# Patient Record
Sex: Female | Born: 1965 | Race: White | Hispanic: No | Marital: Married | Smoking: Never smoker
Health system: Southern US, Community
[De-identification: ages and names within clinical notes are randomized; demographics above are authoritative.]

## PROBLEM LIST (undated history)

## (undated) DIAGNOSIS — E079 Disorder of thyroid, unspecified: Secondary | ICD-10-CM

## (undated) DIAGNOSIS — J069 Acute upper respiratory infection, unspecified: Secondary | ICD-10-CM

## (undated) DIAGNOSIS — G4730 Sleep apnea, unspecified: Secondary | ICD-10-CM

## (undated) DIAGNOSIS — I10 Essential (primary) hypertension: Secondary | ICD-10-CM

## (undated) DIAGNOSIS — D649 Anemia, unspecified: Secondary | ICD-10-CM

## (undated) DIAGNOSIS — E7800 Pure hypercholesterolemia, unspecified: Secondary | ICD-10-CM

## (undated) DIAGNOSIS — I499 Cardiac arrhythmia, unspecified: Secondary | ICD-10-CM

## (undated) DIAGNOSIS — K219 Gastro-esophageal reflux disease without esophagitis: Secondary | ICD-10-CM

## (undated) DIAGNOSIS — R011 Cardiac murmur, unspecified: Secondary | ICD-10-CM

## (undated) DIAGNOSIS — R609 Edema, unspecified: Secondary | ICD-10-CM

## (undated) DIAGNOSIS — N739 Female pelvic inflammatory disease, unspecified: Secondary | ICD-10-CM

## (undated) DIAGNOSIS — G43909 Migraine, unspecified, not intractable, without status migrainosus: Secondary | ICD-10-CM

## (undated) DIAGNOSIS — L509 Urticaria, unspecified: Secondary | ICD-10-CM

## (undated) DIAGNOSIS — G4733 Obstructive sleep apnea (adult) (pediatric): Secondary | ICD-10-CM

## (undated) DIAGNOSIS — F419 Anxiety disorder, unspecified: Secondary | ICD-10-CM

## (undated) HISTORY — DX: Anxiety disorder, unspecified: F41.9

## (undated) HISTORY — DX: Urticaria, unspecified: L50.9

## (undated) HISTORY — PX: CARPAL TUNNEL RELEASE: SHX101

## (undated) HISTORY — DX: Pure hypercholesterolemia, unspecified: E78.00

## (undated) HISTORY — DX: Acute upper respiratory infection, unspecified: J06.9

## (undated) HISTORY — DX: Female pelvic inflammatory disease, unspecified: N73.9

## (undated) HISTORY — PX: COSMETIC SURGERY: SHX468

## (undated) HISTORY — DX: Cardiac murmur, unspecified: R01.1

## (undated) HISTORY — DX: Cardiac arrhythmia, unspecified: I49.9

## (undated) HISTORY — DX: Sleep apnea, unspecified: G47.30

## (undated) HISTORY — PX: OTHER SURGICAL HISTORY: SHX169

## (undated) HISTORY — DX: Obstructive sleep apnea (adult) (pediatric): G47.33

## (undated) HISTORY — DX: Migraine, unspecified, not intractable, without status migrainosus: G43.909

## (undated) HISTORY — DX: Anemia, unspecified: D64.9

## (undated) HISTORY — DX: Gastro-esophageal reflux disease without esophagitis: K21.9

---
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
## 1987-01-19 HISTORY — PX: TUBAL LIGATION: SHX77

## 2003-01-19 HISTORY — PX: OTHER SURGICAL HISTORY: SHX169

## 2003-01-19 HISTORY — PX: AUGMENTATION MAMMAPLASTY: SUR837

## 2003-01-19 HISTORY — PX: BREAST SURGERY: SHX581

## 2013-10-18 ENCOUNTER — Emergency Department (HOSPITAL_COMMUNITY)
Admission: EM | Admit: 2013-10-18 | Discharge: 2013-10-18 | Disposition: A | Discharge status: Home / Self Care | Payer: 59 | Source: Home / Self Care | Attending: Family Medicine | Admitting: Family Medicine

## 2013-10-18 ENCOUNTER — Encounter (HOSPITAL_COMMUNITY): Payer: Self-pay | Admitting: Emergency Medicine

## 2013-10-18 ENCOUNTER — Emergency Department (INDEPENDENT_AMBULATORY_CARE_PROVIDER_SITE_OTHER): Payer: 59

## 2013-10-18 DIAGNOSIS — S92911B Unspecified fracture of right toe(s), initial encounter for open fracture: Secondary | ICD-10-CM

## 2013-10-18 HISTORY — DX: Edema, unspecified: R60.9

## 2013-10-18 HISTORY — DX: Disorder of thyroid, unspecified: E07.9

## 2013-10-18 MED ORDER — CEFUROXIME AXETIL 500 MG PO TABS: 500.0000 mg | ORAL_TABLET | Freq: Two times a day (BID) | ORAL | Status: DC

## 2013-10-18 MED ORDER — CEFUROXIME AXETIL 250 MG PO TABS: 250.0000 mg | ORAL_TABLET | Freq: Two times a day (BID) | ORAL | Status: DC

## 2013-10-18 MED ORDER — FLUCONAZOLE 150 MG PO TABS: ORAL_TABLET | ORAL | Status: DC

## 2013-10-18 NOTE — ED Provider Notes (Signed)
Medical screening examination/treatment/procedure(s) were performed by resident physician or non-physician practitioner and as supervising physician I was immediately available for consultation/collaboration.   Pauline Good MD.   Billy Fischer, MD 10/18/13 2111

## 2013-10-18 NOTE — ED Provider Notes (Signed)
CSN: [id number]     Arrival date & time 10/18/13  1738 History   None    Chief Complaint  Patient presents with  . Fall   (Consider location/radiation/quality/duration/timing/severity/associated sxs/prior Treatment) HPI   48 year old female presents complaining of injury to her right foot. 2 days ago she slipped and stepped her toe on something. It was extremely painful, and is now starting to feel numb and the end of the toe. She has redness and bleeding around the base of the toenail. She also felt like her toe was angulated immediately after the injury occurred. It was initially very painful to walk on it but that has gotten slightly better. No other injury. No systemic symptoms.  Past Medical History  Diagnosis Date  . Thyroid disease   . Fluid retention    Past Surgical History  Procedure Laterality Date  . Tonsillectomy  1972  . Tummy tuck  2005  . Breast surgery  2005    Augmentation  . Carpal tunnel release Bilateral     R in '07 and L '06  . Tubal ligation  1989   Family History  Problem Relation Age of Onset  . Diabetes Mother   . Cancer Mother     vaginal  . Cancer Father     pancreatic   History  Substance Use Topics  . Smoking status: Never Smoker   . Smokeless tobacco: Not on file  . Alcohol Use: Yes     Comment: occasional   OB History   Grav Para Term Preterm Abortions TAB SAB Ect Mult Living                 Review of Systems  Musculoskeletal: Positive for gait problem.       See history of present illness  Neurological: Positive for numbness.  All other systems reviewed and are negative.   Allergies  Review of patient's allergies indicates no known allergies.  Home Medications   Prior to Admission medications   Medication Sig Start Date End Date Taking? Authorizing Provider  levothyroxine (SYNTHROID, LEVOTHROID) 100 MCG tablet Take 100 mcg by mouth daily before breakfast.   Yes Historical Provider, MD  Prenatal Vit-Fe Fumarate-FA  (MULTIVITAMIN-PRENATAL) 27-0.8 MG TABS tablet Take 1 tablet by mouth daily at 12 noon. OTC prenatal   Yes Historical Provider, MD  triamterene-hydrochlorothiazide (MAXZIDE-25) 37.5-25 MG per tablet Take 1 tablet by mouth daily.   Yes Historical Provider, MD  cefUROXime (CEFTIN) 250 MG tablet Take 1 tablet (250 mg total) by mouth 2 (two) times daily with a meal. 10/18/13   Liam Graham, PA-C  fluconazole (DIFLUCAN) 150 MG tablet 1 tablet every 5 days as needed 10/18/13   Liam Graham, PA-C   BP 150/98  Pulse 90  Temp(Src) 98.3 F (36.8 C) (Oral)  Resp 14  SpO2 98% Physical Exam  Nursing note and vitals reviewed. Constitutional: She is oriented to person, place, and time. Vital signs are normal. She appears well-developed and well-nourished. No distress.  HENT:  Head: Normocephalic and atraumatic.  Pulmonary/Chest: Effort normal. No respiratory distress.  Musculoskeletal:       Right foot: She exhibits tenderness (Minimal tenderness of the right second toe. The base of the toenail is erythematous, scabbed. The end of the toe has decreased sensation.). She exhibits no swelling, normal capillary refill and no deformity.  Neurological: She is alert and oriented to person, place, and time. She has normal strength. Coordination normal.  Skin: Skin is warm and dry.  No rash noted. She is not diaphoretic.  Psychiatric: She has a normal mood and affect. Judgment normal.    ED Course  Procedures (including critical care time) Labs Review Labs Reviewed - No data to display  Imaging Review Dg Foot Complete Right  10/18/2013   CLINICAL DATA:  48 year old female with a fall on Tuesday.  EXAM: RIGHT FOOT COMPLETE - 3+ VIEW  COMPARISON:  None.  FINDINGS: Tiny fracture of the lateral aspect of the distal phalanx of the second toe. No other acute bony abnormality. No significant soft tissue swelling. Tarsal bones are aligned. No radiopaque foreign body.  IMPRESSION: Tiny fracture at the lateral aspect  at the base of the distal phalanx of the second toe. No other fracture identified.  Signed,  Dulcy Fanny. Earleen Newport, DO  [facility]  [facility]   Electronically Signed   By: Corrie Mckusick O.D.   On: 10/18/2013 18:44     MDM   1. Toe fracture, right, open, initial encounter    She is bleeding from around the base of the toe, technically this is an open fracture. We'll treat with Ceftin, buddy tape, rigid soled shoe. Followup with orthopedics.   Meds ordered this encounter  Medications  . cefUROXime (CEFTIN) 250 MG tablet    Sig: Take 1 tablet (250 mg total) by mouth 2 (two) times daily with a meal.    Dispense:  20 tablet    Refill:  0    Order Specific Question:  Supervising Provider    Answer:  Jake Michaelis, DAVID C [id number]  . fluconazole (DIFLUCAN) 150 MG tablet    Sig: 1 tablet every 5 days as needed    Dispense:  3 tablet    Refill:  1    Order Specific Question:  Supervising Provider    Answer:  Jake Michaelis, DAVID C [place], PA-C 10/18/13 1913

## 2013-10-18 NOTE — ED Notes (Signed)
Slipped on wet ground and fell backwards, her R foot hit a concrete block.  C/o pain and swelling to R 2nd toe.  It bled around the nailbed.  Pt. states the toe appears crooked to the R and is numb on the end.

## 2013-10-18 NOTE — Discharge Instructions (Signed)
Fracture A fracture is a break in a bone, due to a force on the bone that is greater than the bone's strength can handle. There are many types of fractures, including:  Complete fracture: The break passes completely through the bone.  Displaced: The ends of the bone fragments are not properly aligned.  Non-displaced: The ends of the bone fragments are in proper alignment.  Incomplete fracture (greenstick): The break does not pass completely through the bone. Incomplete fractures may or may not be angular (angulated).  Open fracture (compound): Part of the broken bone pokes through the skin. Open fractures have a high risk for infection.  Closed fracture: The fracture has not broken through the skin.  Comminuted fracture: The bone is broken into more than two pieces.  Compression fracture: The break occurs from extreme pressure on the bone (includes crushing injury).  Impacted fracture: The broken bone ends have been driven into each other.  Avulsion fracture: A ligament or tendon pulls a small piece of bone off from the main bony segment.  Pathologic fracture: A fracture due to the bone being made weak by a disease (osteoporosis or tumors).  Stress fracture: A fracture caused by intense exercise or repetitive and prolonged pressure that makes the bone weak. SYMPTOMS   Pain, tenderness, bleeding, bruising, and swelling at the fracture site.  Weakness and inability to bear weight on the injured extremity.  Paleness and deformity (sometimes).  Loss of pulse, numbness, tingling, or paralysis below the fracture site (usually a limb); these are emergencies. CAUSES  Bone being subjected to a force greater than its strength. RISK INCREASES WITH:  Contact sports and falls from heights.  Previous or current bone problems (osteoporosis or tumors).  Poor balance.  Poor strength and flexibility. PREVENTION   Warm up and stretch properly before activity.  Maintain physical  fitness:  Cardiovascular fitness.  Muscle strength.  Flexibility and endurance.  Wear proper protective equipment.  Use proper exercise technique. RELATED COMPLICATIONS   Bone fails to heal (nonunion).  Bone heals in a poor position (malunion).  Low blood volume (hypovolemic), shock due to blood loss.  Clump of fat cells travels through the blood (fat embolus) from the injury site to the lungs or brain (more common with thigh fractures).  Obstruction of nearby arteries. TREATMENT  Treatment first requires realigning of the bones (reduction) by a medically trained person, if the fracture is displaced. After realignment if the fracture is completed, or for non-displaced fractures, ice and medicine are used to reduce pain and inflammation. The bone and adjacent joints are then restrained with a splint, cast, or brace to allow the bones to heal without moving. Surgery is sometimes needed, to reposition the bones and hold the position with rods, pins, plates, or screws. Restraint for long periods of time may result in muscle and joint weakness or build up of fluid in tissues (edema). For this reason, physical therapy is often needed to regain strength and full range of motion. Recovery is complete when there is no bone motion at the fracture site and x-rays (radiographs) show complete healing.  MEDICATION   General anesthesia, sedation, or muscle relaxants may be needed to allow for realignment of the fracture. If pain medicine is needed, nonsteroidal anti-inflammatory medicines (aspirin and ibuprofen), or other minor pain relievers (acetaminophen), are often advised.  Do not take pain medicine for 7 days before surgery.  Stronger pain relievers may be prescribed by your caregiver. Use only as directed and only as much  as you need. SEEK MEDICAL CARE IF:   The following occur after restraint or surgery. (Report any of these signs immediately):  Swelling above or below the fracture  site.  Severe, persistent pain.  Blue or gray skin below the fracture site, especially under the nails. Numbness or loss of feeling below the fracture site. Document Released: 01/04/2005 Document Revised: 12/22/2011 Document Reviewed: 04/18/2008 [facility] Patient Information 2015 [place], [place]. This information is not intended to replace advice given to you by your health care provider. Make sure you discuss any questions you have with your health care provider.  Buddy Taping of Toes We have taped your toes together to keep them from moving. This is called "buddy taping" since we used a part of your own body to keep the injured part still. We placed soft padding between your toes to keep them from rubbing against each other. Buddy taping will help with healing and to reduce pain. Keep your toes buddy taped together for as long as directed by your caregiver. HOME CARE INSTRUCTIONS   Raise your injured area above the level of your heart while sitting or lying down. Prop it up with pillows.  An ice pack used every twenty minutes, while awake, for the first one to two days may be helpful. Put ice in a plastic bag and put a towel between the bag and your skin.  Watch for signs that the taping is too tight. These signs may be:  Numbness of your taped toes.  Coolness of your taped toes.  Color change in the area beyond the tape.  Increased pain.  If you have any of these signs, loosen or rewrap the tape. If you need to loosen or rewrap the buddy tape, make sure you use the padding again. SEEK IMMEDIATE MEDICAL CARE IF:   You have worse pain, swelling, inflammation (soreness), drainage or bleeding after you rewrap the tape.  Any new problems occur. MAKE SURE YOU:   Understand these instructions.  Will watch your condition.  Will get help right away if you are not doing well or get worse. Document Released: 10/09/2003 Document Revised: 03/29/2011 Document Reviewed:  01/02/2008 [facility] Patient Information 2015 [place], [place]. This information is not intended to replace advice given to you by your health care provider. Make sure you discuss any questions you have with your health care provider.  Toe Fracture Your caregiver has diagnosed you as having a fractured toe. A toe fracture is a break in the bone of a toe. "Buddy taping" is a way of splinting your broken toe, by taping the broken toe to the toe next to it. This "buddy taping" will keep the injured toe from moving beyond normal range of motion. Buddy taping also helps the toe heal in a more normal alignment. It may take 6 to 8 weeks for the toe injury to heal. [place] your toes taped together for as long as directed by your caregiver or until you see a doctor for a follow-up examination. You can change the tape after bathing. Always use a small piece of gauze or cotton between the toes when taping them together. This will help the skin stay dry and prevent infection.  Apply ice to the injury for 15-20 minutes each hour while awake for the first 2 days. Put the ice in a plastic bag and place a towel between the bag of ice and your skin.  After the first 2 days, apply heat to the injured  area. Use heat for the next 2 to 3 days. Place a heating pad on the foot or soak the foot in warm water as directed by your caregiver.  Keep your foot elevated as much as possible to lessen swelling.  Wear sturdy, supportive shoes. The shoes should not pinch the toes or fit tightly against the toes.  Your caregiver may prescribe a rigid shoe if your foot is very swollen.  Your may be given crutches if the pain is too great and it hurts too much to walk.  Only take over-the-counter or prescription medicines for pain, discomfort, or fever as directed by your caregiver.  If your caregiver has given you a follow-up appointment, it is very important to keep that appointment. Not keeping the  appointment could result in a chronic or permanent injury, pain, and disability. If there is any problem keeping the appointment, you must call back to this facility for assistance. SEEK MEDICAL CARE IF:   You have increased pain or swelling, not relieved with medications.  The pain does not get better after 1 week.  Your injured toe is cold when the others are warm. SEEK IMMEDIATE MEDICAL CARE IF:   The toe becomes cold, numb, or white.  The toe becomes hot (inflamed) and red. Document Released: 01/02/2000 Document Revised: 03/29/2011 Document Reviewed: 08/21/2007 [facility] Patient Information 2015 [place], [place]. This information is not intended to replace advice given to you by your health care provider. Make sure you discuss any questions you have with your health care provider.

## 2013-12-22 ENCOUNTER — Encounter (HOSPITAL_COMMUNITY): Payer: Self-pay | Admitting: *Deleted

## 2013-12-22 ENCOUNTER — Emergency Department (INDEPENDENT_AMBULATORY_CARE_PROVIDER_SITE_OTHER)
Admission: EM | Admit: 2013-12-22 | Discharge: 2013-12-22 | Disposition: A | Discharge status: Home / Self Care | Payer: 59 | Source: Home / Self Care | Attending: Emergency Medicine | Admitting: Emergency Medicine

## 2013-12-22 DIAGNOSIS — L049 Acute lymphadenitis, unspecified: Secondary | ICD-10-CM

## 2013-12-22 HISTORY — DX: Essential (primary) hypertension: I10

## 2013-12-22 MED ORDER — CLINDAMYCIN HCL 300 MG PO CAPS: 300.0000 mg | ORAL_CAPSULE | Freq: Three times a day (TID) | ORAL | Status: DC

## 2013-12-22 MED ORDER — FLUCONAZOLE 150 MG PO TABS: ORAL_TABLET | ORAL | Status: DC

## 2013-12-22 NOTE — Discharge Instructions (Signed)
You have an infection of your lymph node. Take clindamycin 1 pill 3 times a day for 10 days. Clindamycin might cause some mild diarrhea. You should see improvement 24-48 hours after starting the antibiotic.  If you develop fevers, difficulty swallowing, or the lymph node is not improving in the next 2 days, please go to the emergency room.

## 2013-12-22 NOTE — ED Notes (Signed)
C/o swelling under chin onset Thur. and progressively got bigger and more painful.  Has 1' reddened area over it.

## 2013-12-22 NOTE — ED Provider Notes (Signed)
CSN: [id number]     Arrival date & time 12/22/13  0920 History   First MD Initiated Contact with Patient 12/22/13 [phone]     Chief Complaint  Patient presents with  . Abscess   (Consider location/radiation/quality/duration/timing/severity/associated sxs/prior Treatment) HPI  She is a 48 year old woman here for evaluation of neck swelling. She states 2 days ago she noticed a small bump under her chin. Over the last 2 days, it has gotten quite large. It has an overlying area of erythema. She denies any fevers or chills. No nausea or vomiting. No difficulty swallowing or breathing.  Past Medical History  Diagnosis Date  . Thyroid disease   . Fluid retention   . Hypertension    Past Surgical History  Procedure Laterality Date  . Tonsillectomy  1972  . Tummy tuck  2005  . Breast surgery  2005    Augmentation  . Carpal tunnel release Bilateral     R in '07 and L '06  . Tubal ligation  1989   Family History  Problem Relation Age of Onset  . Diabetes Mother   . Cancer Mother     vaginal  . Cancer Father     pancreatic   History  Substance Use Topics  . Smoking status: Never Smoker   . Smokeless tobacco: Not on file  . Alcohol Use: Yes     Comment: occasional   OB History    No data available     Review of Systems As in history of present illness Allergies  Review of patient's allergies indicates no known allergies.  Home Medications   Prior to Admission medications   Medication Sig Start Date End Date Taking? Authorizing Provider  Biotin 5000 MCG CAPS Take 1 capsule by mouth daily.   Yes Historical Provider, MD  Chasteberry Extract POWD 500 mg by Does not apply route.   Yes Historical Provider, MD  Cholecalciferol (VITAMIN D-3) 1000 UNITS CAPS Take 2 capsules by mouth daily.   Yes Historical Provider, MD  Cinnamon 500 MG TABS Take 2 tablets by mouth daily.   Yes Historical Provider, MD  levothyroxine (SYNTHROID, LEVOTHROID) 100 MCG tablet Take 100 mcg by mouth daily  before breakfast.   Yes Historical Provider, MD  PANAX GINSENG PO Take 600 mg by mouth daily.   Yes Historical Provider, MD  phentermine 37.5 MG capsule Take 37.5 mg by mouth every morning.   Yes Historical Provider, MD  triamterene-hydrochlorothiazide (MAXZIDE-25) 37.5-25 MG per tablet Take 1 tablet by mouth daily.   Yes Historical Provider, MD  cefUROXime (CEFTIN) 250 MG tablet Take 1 tablet (250 mg total) by mouth 2 (two) times daily with a meal. 10/18/13   Liam Graham, PA-C  clindamycin (CLEOCIN) 300 MG capsule Take 1 capsule (300 mg total) by mouth 3 (three) times daily. 12/22/13   Melony Overly, MD  fluconazole (DIFLUCAN) 150 MG tablet 1 tablet every 5 days as needed 12/22/13   Melony Overly, MD  Prenatal Vit-Fe Fumarate-FA (MULTIVITAMIN-PRENATAL) 27-0.8 MG TABS tablet Take 1 tablet by mouth daily at 12 noon. OTC prenatal    Historical Provider, MD   BP 154/99 mmHg  Pulse 97  Temp(Src) 99.1 F (37.3 C)  Resp 12  SpO2 100% Physical Exam  Constitutional: She appears well-developed and well-nourished. No distress.  HENT:  Head: Normocephalic and atraumatic.  Right Ear: External ear normal.  Left Ear: External ear normal.  Nose: Nose normal.  Mouth/Throat: Oropharynx is clear and moist. Mucous membranes are  not dry. No dental abscesses or uvula swelling. No oropharyngeal exudate or tonsillar abscesses.  Eyes: Conjunctivae are normal.  Neck: Neck supple.  Lymphadenopathy:    She has cervical adenopathy (left submandibular lymph node swollen (3cm) and indurated, no fluctuance).    ED Course  Procedures (including critical care time) Labs Review Labs Reviewed - No data to display  Imaging Review No results found.   MDM   1. Lymphadenitis, acute    History and exam consistent with acute lymphadenitis. Will treat with clindamycin 300 mg 3 times a day for 10 days. Prescription for Diflucan also given as she reports frequent yeast infections after antibiotics. She is to go to  the emergency room if she develops difficulty breathing or swallowing, fevers, or the lump does not improve. She has an initial appointment with her new PCP on Tuesday.    Melony Overly, MD 12/22/13 [phone]

## 2013-12-24 ENCOUNTER — Other Ambulatory Visit: Payer: 59 | Admitting: Internal Medicine

## 2013-12-24 DIAGNOSIS — Z1321 Encounter for screening for nutritional disorder: Secondary | ICD-10-CM

## 2013-12-24 DIAGNOSIS — Z13220 Encounter for screening for lipoid disorders: Secondary | ICD-10-CM

## 2013-12-24 DIAGNOSIS — Z1329 Encounter for screening for other suspected endocrine disorder: Secondary | ICD-10-CM

## 2013-12-24 DIAGNOSIS — Z0000 Encounter for general adult medical examination without abnormal findings: Secondary | ICD-10-CM

## 2013-12-24 DIAGNOSIS — Z130 Encounter for screening for diseases of the blood and blood-forming organs and certain disorders involving the immune mechanism: Secondary | ICD-10-CM

## 2013-12-24 LAB — CBC WITH DIFFERENTIAL/PLATELET
Basophils Absolute: 0.1 10*3/uL (ref 0.0–0.1)
Basophils Relative: 1 % (ref 0–1)
Eosinophils Absolute: 0.1 10*3/uL (ref 0.0–0.7)
Eosinophils Relative: 2 % (ref 0–5)
HEMATOCRIT: 39.8 % (ref 36.0–46.0)
HEMOGLOBIN: 13 g/dL (ref 12.0–15.0)
LYMPHS PCT: 35 % (ref 12–46)
Lymphs Abs: 2 10*3/uL (ref 0.7–4.0)
MCH: 31.1 pg (ref 26.0–34.0)
MCHC: 32.7 g/dL (ref 30.0–36.0)
MCV: 95.2 fL (ref 78.0–100.0)
MONOS PCT: 6 % (ref 3–12)
MPV: 9.5 fL (ref 9.4–12.4)
Monocytes Absolute: 0.3 10*3/uL (ref 0.1–1.0)
NEUTROS ABS: 3.2 10*3/uL (ref 1.7–7.7)
Neutrophils Relative %: 56 % (ref 43–77)
Platelets: 300 10*3/uL (ref 150–400)
RBC: 4.18 MIL/uL (ref 3.87–5.11)
RDW: 13.9 % (ref 11.5–15.5)
WBC: 5.8 10*3/uL (ref 4.0–10.5)

## 2013-12-24 LAB — LIPID PANEL
CHOLESTEROL: 195 mg/dL (ref 0–200)
HDL: 82 mg/dL (ref >39)
LDL CALC: 98 mg/dL (ref 0–99)
Total CHOL/HDL Ratio: 2.4 Ratio
Triglycerides: 73 mg/dL (ref <150)
VLDL: 15 mg/dL (ref 0–40)

## 2013-12-24 LAB — COMPREHENSIVE METABOLIC PANEL
ALBUMIN: 4.3 g/dL (ref 3.5–5.2)
ALT: 13 U/L (ref 0–35)
AST: 16 U/L (ref 0–37)
Alkaline Phosphatase: 66 U/L (ref 39–117)
BUN: 17 mg/dL (ref 6–23)
CO2: 31 mEq/L (ref 19–32)
Calcium: 10 mg/dL (ref 8.4–10.5)
Chloride: 100 mEq/L (ref 96–112)
Creat: 0.95 mg/dL (ref 0.50–1.10)
GLUCOSE: 77 mg/dL (ref 70–99)
POTASSIUM: 4.3 meq/L (ref 3.5–5.3)
SODIUM: 142 meq/L (ref 135–145)
Total Bilirubin: 0.8 mg/dL (ref 0.2–1.2)
Total Protein: 7.1 g/dL (ref 6.0–8.3)

## 2013-12-25 ENCOUNTER — Encounter: Payer: Self-pay | Admitting: Internal Medicine

## 2013-12-25 ENCOUNTER — Ambulatory Visit (INDEPENDENT_AMBULATORY_CARE_PROVIDER_SITE_OTHER): Payer: 59 | Admitting: Internal Medicine

## 2013-12-25 ENCOUNTER — Other Ambulatory Visit (HOSPITAL_COMMUNITY)
Admission: RE | Admit: 2013-12-25 | Discharge: 2013-12-25 | Disposition: A | Discharge status: Home / Self Care | Payer: 59 | Source: Ambulatory Visit | Attending: Internal Medicine | Admitting: Internal Medicine

## 2013-12-25 VITALS — BP 140/90 | HR 100 | Temp 98.4°F | Ht 62.0 in | Wt 164.0 lb

## 2013-12-25 DIAGNOSIS — L0213 Carbuncle of neck: Secondary | ICD-10-CM

## 2013-12-25 DIAGNOSIS — Z8669 Personal history of other diseases of the nervous system and sense organs: Secondary | ICD-10-CM

## 2013-12-25 DIAGNOSIS — F411 Generalized anxiety disorder: Secondary | ICD-10-CM

## 2013-12-25 DIAGNOSIS — Z01419 Encounter for gynecological examination (general) (routine) without abnormal findings: Secondary | ICD-10-CM | POA: Insufficient documentation

## 2013-12-25 DIAGNOSIS — E039 Hypothyroidism, unspecified: Secondary | ICD-10-CM

## 2013-12-25 DIAGNOSIS — Z23 Encounter for immunization: Secondary | ICD-10-CM

## 2013-12-25 DIAGNOSIS — Z0000 Encounter for general adult medical examination without abnormal findings: Secondary | ICD-10-CM

## 2013-12-25 LAB — POCT URINALYSIS DIPSTICK
Bilirubin, UA: NEGATIVE
GLUCOSE UA: NEGATIVE
Ketones, UA: NEGATIVE
Leukocytes, UA: NEGATIVE
Nitrite, UA: NEGATIVE
PH UA: 8
PROTEIN UA: NEGATIVE
Spec Grav, UA: 1.01
Urobilinogen, UA: NEGATIVE

## 2013-12-25 LAB — TSH: TSH: 2.912 u[IU]/mL (ref 0.350–4.500)

## 2013-12-25 LAB — VITAMIN D 25 HYDROXY (VIT D DEFICIENCY, FRACTURES): Vit D, 25-Hydroxy: 59 ng/mL (ref 30–100)

## 2013-12-25 MED ORDER — DOXYCYCLINE HYCLATE 100 MG PO TABS: 100.0000 mg | ORAL_TABLET | Freq: Two times a day (BID) | ORAL | Status: DC

## 2013-12-25 MED ORDER — TRIAMTERENE-HCTZ 37.5-25 MG PO TABS: 1.0000 | ORAL_TABLET | Freq: Every day | ORAL | Status: DC

## 2013-12-25 MED ORDER — ALPRAZOLAM 0.5 MG PO TABS: 0.5000 mg | ORAL_TABLET | Freq: Two times a day (BID) | ORAL | Status: DC | PRN

## 2013-12-25 MED ORDER — LEVOTHYROXINE SODIUM 100 MCG PO TABS: 100.0000 ug | ORAL_TABLET | Freq: Every day | ORAL | Status: DC

## 2013-12-25 NOTE — Patient Instructions (Addendum)
Take Doxycycline 100 mg twice a day x 10 days Return Friday. Continue same dose of thyroid replacement. Continue diuretic. Have mammogram. Flu vaccine given. Take Xanax sparingly.

## 2013-12-27 LAB — CYTOLOGY - PAP

## 2013-12-28 ENCOUNTER — Encounter: Payer: Self-pay | Admitting: Internal Medicine

## 2013-12-28 ENCOUNTER — Ambulatory Visit (INDEPENDENT_AMBULATORY_CARE_PROVIDER_SITE_OTHER): Payer: 59 | Admitting: Internal Medicine

## 2013-12-28 VITALS — BP 134/90 | HR 102 | Temp 98.5°F | Wt 164.0 lb

## 2013-12-28 DIAGNOSIS — L0293 Carbuncle, unspecified: Secondary | ICD-10-CM

## 2014-01-07 ENCOUNTER — Telehealth: Payer: Self-pay | Admitting: Internal Medicine

## 2014-01-07 NOTE — Telephone Encounter (Signed)
See tomorrow

## 2014-01-07 NOTE — Telephone Encounter (Signed)
Pt called and has completed the round of doxycycline (VIBRA-TABS) 100 MG tablet  But is still having problems with the knot in throat area.  Offered appointment, however pt would like speak to clinical.  Knot is approximately quarter sized and not decreasing.  Please advise, best number to reach patient is [phone]. / lt

## 2014-01-08 ENCOUNTER — Encounter: Payer: Self-pay | Admitting: Internal Medicine

## 2014-01-08 ENCOUNTER — Ambulatory Visit (INDEPENDENT_AMBULATORY_CARE_PROVIDER_SITE_OTHER): Payer: 59 | Admitting: Internal Medicine

## 2014-01-08 VITALS — BP 132/84 | HR 108 | Temp 98.3°F | Wt 162.0 lb

## 2014-01-08 DIAGNOSIS — F418 Other specified anxiety disorders: Secondary | ICD-10-CM

## 2014-01-08 DIAGNOSIS — F32A Depression, unspecified: Secondary | ICD-10-CM

## 2014-01-08 DIAGNOSIS — F419 Anxiety disorder, unspecified: Secondary | ICD-10-CM

## 2014-01-08 DIAGNOSIS — F411 Generalized anxiety disorder: Secondary | ICD-10-CM

## 2014-01-08 DIAGNOSIS — I889 Nonspecific lymphadenitis, unspecified: Secondary | ICD-10-CM

## 2014-01-08 DIAGNOSIS — Z8669 Personal history of other diseases of the nervous system and sense organs: Secondary | ICD-10-CM

## 2014-01-08 DIAGNOSIS — F329 Major depressive disorder, single episode, unspecified: Secondary | ICD-10-CM

## 2014-01-08 DIAGNOSIS — L0293 Carbuncle, unspecified: Secondary | ICD-10-CM

## 2014-01-08 MED ORDER — DOXYCYCLINE HYCLATE 100 MG PO TABS: 100.0000 mg | ORAL_TABLET | Freq: Two times a day (BID) | ORAL | Status: DC

## 2014-01-08 MED ORDER — ALPRAZOLAM 0.25 MG PO TABS: 0.2500 mg | ORAL_TABLET | Freq: Two times a day (BID) | ORAL | Status: DC | PRN

## 2014-01-08 MED ORDER — SERTRALINE HCL 50 MG PO TABS: 50.0000 mg | ORAL_TABLET | Freq: Every day | ORAL | Status: DC

## 2014-01-08 NOTE — Patient Instructions (Signed)
Take Xanax 0.25mg   Twice a day for anxiety. Take Doxycycline 100 mg twice a day for another 10 days. Start Zoloft 50 mg daily.

## 2014-01-14 ENCOUNTER — Ambulatory Visit: Payer: 59 | Admitting: Internal Medicine

## 2014-01-26 NOTE — Progress Notes (Signed)
Subjective:    Patient ID: Rebecca Mccoy, female    DOB: [birth year], 49 y.o.   MRN: [id number]  HPI  49 year old White Female presents to the office for the first time today. She is married to Concha Pyo who is also a patient here. Patient formerly lived in the [place] area between 2008 and 2015 as her previous husband was in the [employer] and was deployed quite often. Patient works for the [employer] and travels frequently.  Past medical history: History of migraine headaches, history of allergic rhinitis, hypothyroidism, GE reflux. Status post tubal ligation. History of menorrhagia due to uterine fibroids 2006. History of bilateral carpal tunnel syndrome status post surgical release. History of hypertension.  No known drug allergies  Has been taking some phentermine through a wellness clinic in [place] one half of a 37.5 mg capsule.  Patient reports 2 pregnancies and no miscarriages.  Social history: Does not smoke. Social alcohol consumption may be 1 or 2 drinks a month. Husband(Donald) is employed by the [employer]. 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 She completed 2 years of college.  Had IUD inserted in 2008. History of sebaceous cyst on back 2008. History of syncope 2008 referred for echocardiogram and event monitor. Tonsillectomy 1972. Bilateral tubal ligation 1989. Abdominoplasty 2007. Breast augmentation 2010. Right carpal tunnel release February 2010. Left carpal tunnel release March 2011.Mirena IUD placed April 2013. History of anxiety disorder seen at behavioral health clinic [facility] [employer] [facility] [place]. History of palpitations.  Social history: Divorced in 2011. Ex-husband is a Administrator in [employer]. Daughter who resides in [place] with one granddaughter. Patient had anxiety when daughter left for [place] in early 2015.  Had tetanus immunization August 2011.  Records indicate metoprolol was prescribed during an episode of  syncope. Dose was reduced from 50-25 mg daily. Had Pap smear March 2013. Had chlamydia screen January 2014. This may have been a treatment for migraine headaches as well as palpitations/hypertension. Family history: Mother with hx of diabetes and hypertension age 49. Father died of pancreatic cancer. One brother age 1 with history of anxiety. One son age 44 in good health. A daughter living in [place] age 24 in good health. One grandchild.  History of bacterial vaginosis and Candida vaginitis. History of right knee pain. X-ray done 2014 was negative.  History of cellulitis right thigh July 2014. History of chondromalacia of knee 2014.  Had negative exercise tolerance test in 2002 for chest pain. Has clear if therapy for varicose veins in 2004. uterine cancer with history of hypertension and diabetes. Father died with pancreatic cancer, diagnosed at age 1 with history of hypertension and MI.  Seen at urgent care December 5 for presumed carbuncle under chin treated with clindamycin for 10 days. Right distal phalanx second toe fracture October 2015.  Review of Systems  Cardiovascular:       History of hypertension  Genitourinary:       Nocturia  Neurological:       History of migraine headaches  Psychiatric/Behavioral:       History of anxiety for which she takes Xanax       Objective:   Physical Exam  Constitutional: She is oriented to person, place, and time. She appears well-developed and well-nourished. No distress.  HENT:  Head: Normocephalic and atraumatic.  Right Ear: External ear normal.  Left Ear: External ear normal.  Mouth/Throat: Oropharynx is clear and moist. No oropharyngeal exudate.  Eyes: Conjunctivae and  EOM are normal. Pupils are equal, round, and reactive to light. Right eye exhibits no discharge. Left eye exhibits no discharge. No scleral icterus.  Neck: Neck supple. No JVD present. No thyromegaly present.  Cardiovascular: Normal rate, regular rhythm, normal heart  sounds and intact distal pulses.   No murmur heard. Pulmonary/Chest: Breath sounds normal. No respiratory distress. She has no wheezes. She has no rales. She exhibits no tenderness.  Abdominal: Soft. Bowel sounds are normal. She exhibits no distension. There is no tenderness. There is no rebound and no guarding.  Genitourinary:  Pap taken. Bimanual normal.  Musculoskeletal: She exhibits no edema.  Lymphadenopathy:    She has no cervical adenopathy.  Neurological: She is alert and oriented to person, place, and time. Coordination normal.  Skin: Skin is warm and dry. No rash noted. She is not diaphoretic.  Enlarged nodule tender not draining under chin  Psychiatric: She has a normal mood and affect. Her behavior is normal. Judgment and thought content normal.  Vitals reviewed.         Assessment & Plan:  Carbuncle under chin. Could be inflamed lymph node/lymphadenitis. Suspect MRSA. Treat with doxycycline 100 mg twice daily for 10 days  History of hypertension  History of anxiety  History of migraine headaches  Plan: Return in several days to make sure lesion under chin is improving. Refill Synthroid and Maxide. Prescribed Xanax 0.5 mg #30 to take sparingly.

## 2014-02-17 DIAGNOSIS — F419 Anxiety disorder, unspecified: Secondary | ICD-10-CM | POA: Insufficient documentation

## 2014-02-17 DIAGNOSIS — F32A Depression, unspecified: Secondary | ICD-10-CM | POA: Insufficient documentation

## 2014-02-17 DIAGNOSIS — F329 Major depressive disorder, single episode, unspecified: Secondary | ICD-10-CM | POA: Insufficient documentation

## 2014-02-17 DIAGNOSIS — Z8669 Personal history of other diseases of the nervous system and sense organs: Secondary | ICD-10-CM | POA: Insufficient documentation

## 2014-02-17 DIAGNOSIS — L0293 Carbuncle, unspecified: Secondary | ICD-10-CM | POA: Insufficient documentation

## 2014-02-17 HISTORY — DX: Personal history of other diseases of the nervous system and sense organs: Z86.69

## 2014-02-17 HISTORY — DX: Depression, unspecified: F32.A

## 2014-02-17 NOTE — Progress Notes (Signed)
   Subjective:    Patient ID: Rebecca Mccoy, female    DOB: Sep 28, 1965, 49 y.o.   MRN: [id number]  HPI Patient was here for the first time December 8 and was felt to have cervical adenitis/carbuncle under her chin likely to be MRSA infection. She was placed on doxycycline 100 mg twice daily for 10 days. There's been significant improvement but it has not completely resolved. Also wants to discuss anxiety. Sometimes gets very anxious with stressful job and family issues. Like to have Xanax refilled. Says that she used to take an SSRI medication for stress and anxiety and would like to restart that.    Review of Systems     Objective:   Physical Exam  Lesion under chin has decreased in size and is much less tender but still present. No redness or drainage.      Assessment & Plan:  Carbuncle under Chin-improving. Recommend bathing in Hibiclens for 6 months. At risk for recurrence, husband has history of MRSA  Cervical adenitis  Anxiety depression  History of migraine headaches  Plan: Start Zoloft 50 mg daily. Xanax refilled for anxiety. Continue doxycycline 100 mg twice daily for an additional 10 days. Call if not completely resolved at the end of that treatment.  25 minutes spent with patient with examination, counseling and continuity of care

## 2014-02-25 ENCOUNTER — Encounter: Payer: Self-pay | Admitting: Internal Medicine

## 2014-02-25 ENCOUNTER — Ambulatory Visit (INDEPENDENT_AMBULATORY_CARE_PROVIDER_SITE_OTHER): Payer: 59 | Admitting: Internal Medicine

## 2014-02-25 VITALS — BP 116/70 | HR 85 | Temp 98.3°F | Wt 164.0 lb

## 2014-02-25 DIAGNOSIS — N750 Cyst of Bartholin's gland: Secondary | ICD-10-CM

## 2014-02-25 DIAGNOSIS — L0293 Carbuncle, unspecified: Secondary | ICD-10-CM

## 2014-02-25 MED ORDER — DOXYCYCLINE HYCLATE 100 MG PO TABS: 100.0000 mg | ORAL_TABLET | Freq: Two times a day (BID) | ORAL | Status: DC

## 2014-02-25 MED ORDER — LEVOTHYROXINE SODIUM 100 MCG PO TABS: 100.0000 ug | ORAL_TABLET | Freq: Every day | ORAL | Status: DC

## 2014-02-25 MED ORDER — FLUCONAZOLE 150 MG PO TABS: 150.0000 mg | ORAL_TABLET | Freq: Once | ORAL | Status: DC

## 2014-02-25 NOTE — Patient Instructions (Addendum)
Take Doxycycline for 10 days. Sitz baths for 20 minutes a day. See GYN about bartholin's cyst. Use hydrocortisone cream after shaving topically. Continue Hibiclens baths and Bactroban in nostrils.

## 2014-02-25 NOTE — Progress Notes (Signed)
   Subjective:    Patient ID: Rebecca Mccoy, female    DOB: Apr 14, 1965, 49 y.o.   MRN: [id number]  HPI  Patient was treated for presumed MRSA infection left anterior chin/neck area recently. This is completely resolved. She does shave her pubic hair. Has developed "ingrown hairs "from time to time. Now has a lesion in left groin that is irritated. Also noted swelling in left labial area which she thinks is a lymph node. Please see detailed history and physical physical exam information from recent initial visit. Hibiclens bath and Bactroban in nostrils prescribed recently. She says she has been doing this.    Review of Systems     Objective:   Physical Exam  She has a carbuncle in her left groin area. It is red but not draining. There is some induration surrounding the area. Also, appears to have swelling in left labial area which I think is a Bartholin's cyst      Assessment & Plan:  Carbuncle left groin  Bartholin's cyst-left  Plan: GYN consultation. For carbuncle recommend sitz baths 20 minutes daily. Doxycycline 100 mg twice daily for 10 days. If she shaves her pubic hair would like for her to use 1% hydrocortisone cream topically after shaving

## 2014-03-07 ENCOUNTER — Encounter: Payer: Self-pay | Admitting: Women's Health

## 2014-03-07 ENCOUNTER — Ambulatory Visit (INDEPENDENT_AMBULATORY_CARE_PROVIDER_SITE_OTHER): Payer: 59 | Admitting: Women's Health

## 2014-03-07 VITALS — BP 138/80 | Ht 63.0 in | Wt 168.0 lb

## 2014-03-07 DIAGNOSIS — Z01419 Encounter for gynecological examination (general) (routine) without abnormal findings: Secondary | ICD-10-CM

## 2014-03-07 NOTE — Progress Notes (Signed)
Patient ID: Rebecca Mccoy, female   DOB: Sep 13, 1965, 50 y.o.   MRN: [id number] New patient with a problem. Had a normal annual exam with normal Pap 12/2013 at primary care. Was seen this week at primary care for questionable Bartholin's cyst, was instructed to follow-up with gynecologist. Treated for folliculitis with doxycycline which has now resolved. Amenorrheic with Mirena IUD placed 2013. Denies urinary symptoms,  vaginal discharge, abdominal pain or fever.  Exam: Appears well. Folliculitis at left groin resolving minimal erythema noted. Nonindurated. External genitalia within normal limits, no Bartholin's cyst noted, states swelling had been closer to the groin area, not in the vagina, which has now also resolved.  Resolving folliculitis  Plan: Normality of exam reviewed, ways to avoid folliculitis discussed. Reviewed no visible Bartholin's cyst. Aware Mirena IUD is good for 5 years.

## 2014-03-07 NOTE — Patient Instructions (Signed)

## 2014-03-08 LAB — URINALYSIS W MICROSCOPIC + REFLEX CULTURE
Bilirubin Urine: NEGATIVE
CRYSTALS: NONE SEEN
Casts: NONE SEEN
Glucose, UA: NEGATIVE mg/dL
HGB URINE DIPSTICK: NEGATIVE
Ketones, ur: NEGATIVE mg/dL
Leukocytes, UA: NEGATIVE
NITRITE: NEGATIVE
PH: 7.5 (ref 5.0–8.0)
Protein, ur: NEGATIVE mg/dL
Specific Gravity, Urine: 1.014 (ref 1.005–1.030)
Squamous Epithelial / LPF: NONE SEEN
UROBILINOGEN UA: 0.2 mg/dL (ref 0.0–1.0)

## 2014-03-10 LAB — URINE CULTURE

## 2014-03-11 ENCOUNTER — Ambulatory Visit: Payer: 59 | Admitting: Gynecology

## 2014-03-14 ENCOUNTER — Other Ambulatory Visit: Payer: Self-pay | Admitting: Internal Medicine

## 2014-03-14 NOTE — Telephone Encounter (Signed)
Refills sent on maxide

## 2014-03-17 NOTE — Patient Instructions (Signed)
Finish course of antibiotics. Call if not resolved in 3 weeks

## 2014-03-17 NOTE — Progress Notes (Signed)
   Subjective:    Patient ID: Rebecca Mccoy, female    DOB: [birth year], 49 y.o.   MRN: [id number]  HPI  In today after initial visit to follow-up on carbuncle under chin. She is on antibiotic therapy with doxycycline. This well could be MRSA. Lesion is less tender and beginning to decrease in size. She is feeling better and thinks antibiotics are working.    Review of Systems     Objective:   Physical Exam  Carbuncle under chin still present but tender. Not draining. No erythema.      Assessment & Plan:  Carbuncle  Plan: Finish course of antibiotics. Call if not better in 3 weeks. Continue warm hot compresses.

## 2014-04-13 ENCOUNTER — Emergency Department (HOSPITAL_COMMUNITY)
Admission: EM | Admit: 2014-04-13 | Discharge: 2014-04-13 | Disposition: A | Discharge status: Home / Self Care | Payer: 59 | Source: Home / Self Care | Attending: Family Medicine | Admitting: Family Medicine

## 2014-04-13 ENCOUNTER — Emergency Department (INDEPENDENT_AMBULATORY_CARE_PROVIDER_SITE_OTHER): Payer: 59

## 2014-04-13 ENCOUNTER — Encounter (HOSPITAL_COMMUNITY): Payer: Self-pay | Admitting: *Deleted

## 2014-04-13 DIAGNOSIS — S60221A Contusion of right hand, initial encounter: Secondary | ICD-10-CM

## 2014-04-13 IMAGING — DX DG HAND COMPLETE 3+V*R*
3 series · 3 of 3 positions shown · non-contrast
Comparison: None.

CLINICAL DATA: Hit right hand on truck door. Pain in the second and
fifth digits.

EXAM:
RIGHT HAND - COMPLETE 3+ VIEW

[hand pa]
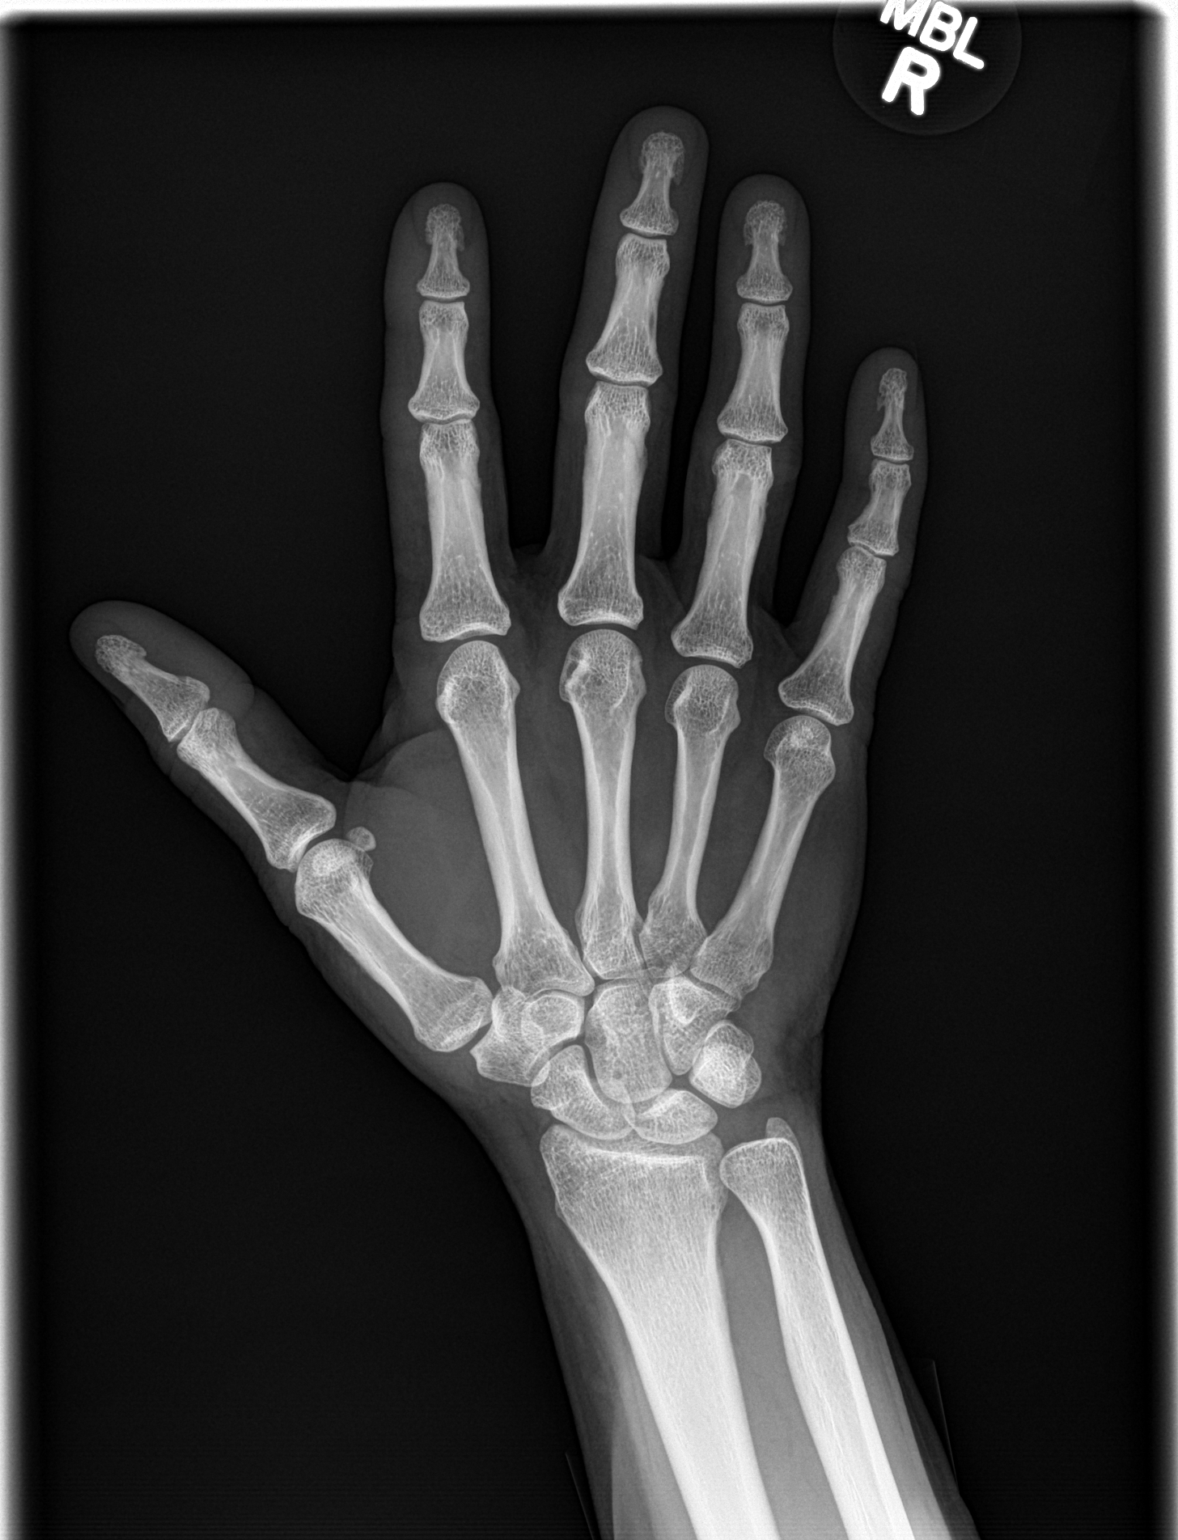

[hand obl]
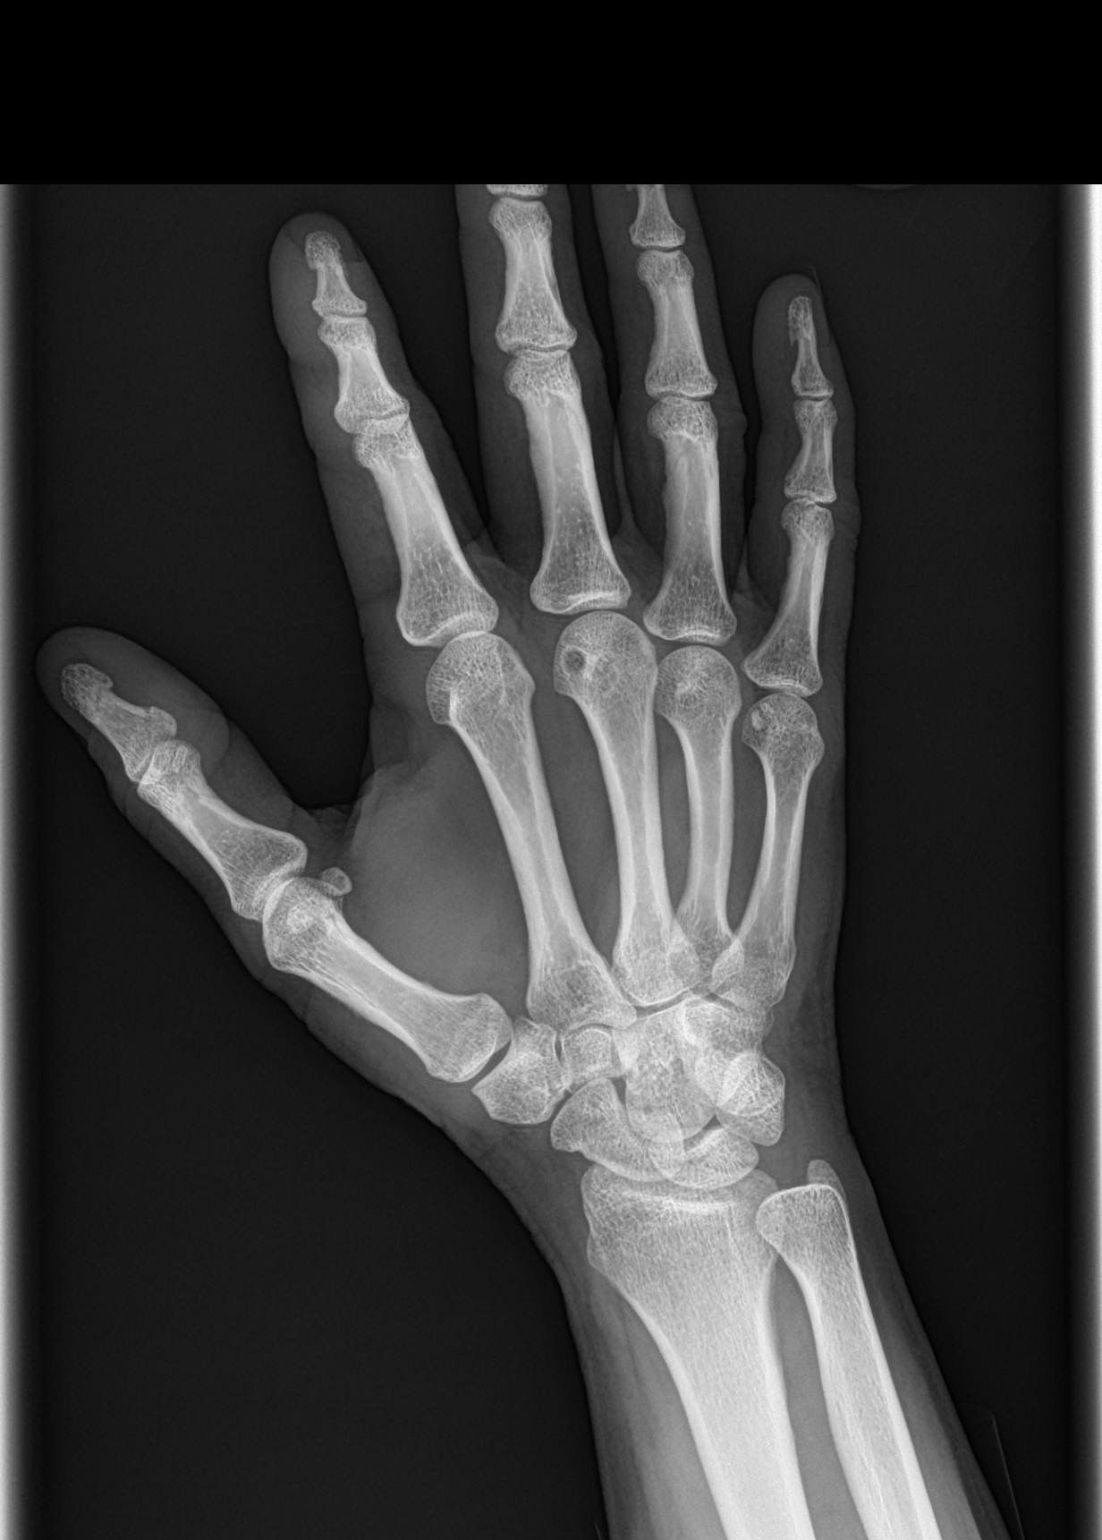

[hand lat]
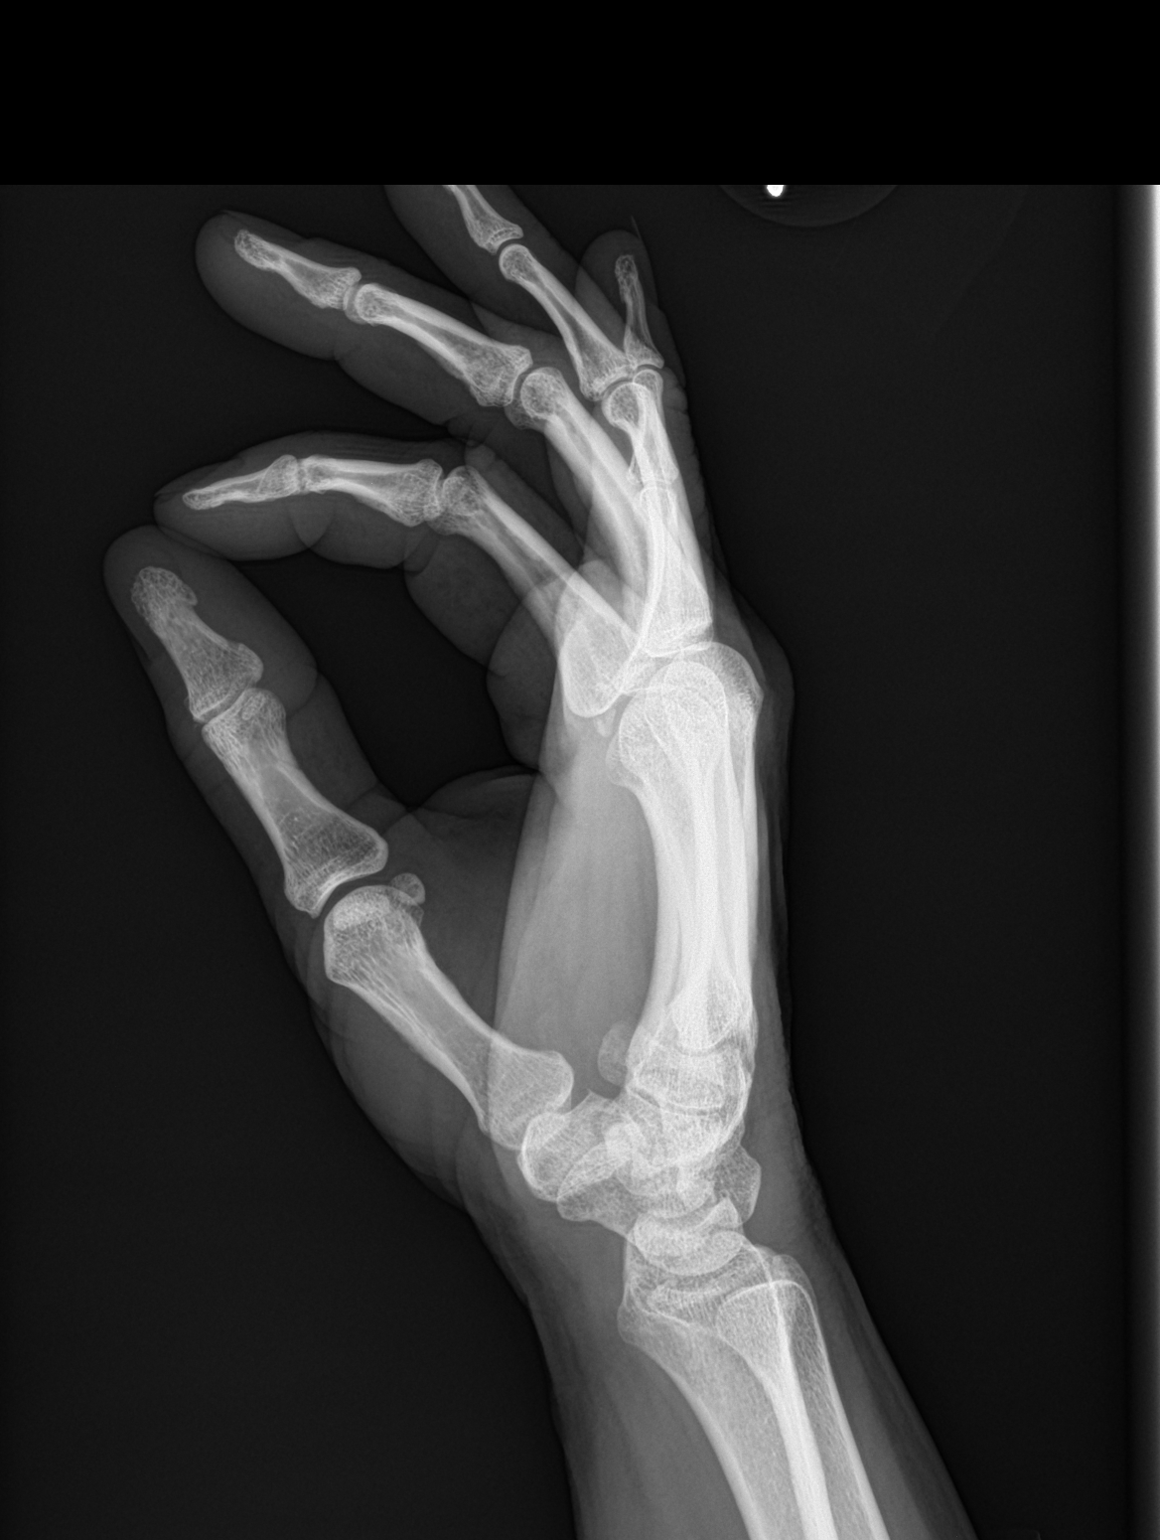

[3 of 3 positions shown; findings below may reference images not displayed]

FINDINGS: There is no evidence of fracture or dislocation. There is no
evidence of arthropathy or other focal bone abnormality. Soft
tissues are unremarkable.
IMPRESSION: No acute bone abnormality to the right hand.

## 2014-04-13 NOTE — Discharge Instructions (Signed)
Warm soak for stiffness and swelling, advil for pain as needed,

## 2014-04-13 NOTE — ED Provider Notes (Signed)
CSN: [id number]     Arrival date & time 04/13/14  0911 History   First MD Initiated Contact with Patient 04/13/14 0932     Chief Complaint  Patient presents with  . Hand Injury   (Consider location/radiation/quality/duration/timing/severity/associated sxs/prior Treatment) Patient is a 49 y.o. female presenting with hand injury. The history is provided by the patient.  Hand Injury Location:  Hand Time since incident:  1 day Injury: yes   Mechanism of injury comment:  Accidentally struck car door. Hand location:  R hand and dorsum of R hand Pain details:    Radiates to:  Does not radiate   Severity:  Mild   Onset quality:  Gradual   Progression:  Unchanged Chronicity:  New Dislocation: no   Relieved by:  None tried Worsened by:  Nothing tried Ineffective treatments:  None tried Associated symptoms: stiffness and swelling   Associated symptoms: no numbness     Past Medical History  Diagnosis Date  . Thyroid disease   . Fluid retention   . Hypertension    Past Surgical History  Procedure Laterality Date  . Tonsillectomy  1972  . Tummy tuck  2005  . Breast surgery  2005    Augmentation  . Carpal tunnel release Bilateral     R in '07 and L '06  . Tubal ligation  1989   Family History  Problem Relation Age of Onset  . Diabetes Mother   . Cancer Mother     vaginal  . Cancer Father     pancreatic   History  Substance Use Topics  . Smoking status: Never Smoker   . Smokeless tobacco: Not on file  . Alcohol Use: 0.0 oz/week    0 Standard drinks or equivalent per week     Comment: occasional   OB History    No data available     Review of Systems  Constitutional: Negative.   Musculoskeletal: Positive for joint swelling and stiffness.  Skin: Negative for wound.    Allergies  Review of patient's allergies indicates no known allergies.  Home Medications   Prior to Admission medications   Medication Sig Start Date End Date Taking? Authorizing Provider    ALPRAZolam (XANAX) 0.25 MG tablet Take 1 tablet (0.25 mg total) by mouth 2 (two) times daily as needed for anxiety. 01/08/14   Elby Showers, MD  Biotin 5000 MCG CAPS Take 1 capsule by mouth daily.    Historical Provider, MD  Chasteberry Extract POWD 500 mg by Does not apply route.    Historical Provider, MD  Cholecalciferol (VITAMIN D-3) 1000 UNITS CAPS Take 2 capsules by mouth daily.    Historical Provider, MD  Cinnamon 500 MG TABS Take 2 tablets by mouth daily.    Historical Provider, MD  doxycycline (VIBRA-TABS) 100 MG tablet Take 1 tablet (100 mg total) by mouth 2 (two) times daily. 02/25/14   Elby Showers, MD  fluconazole (DIFLUCAN) 150 MG tablet Take 1 tablet (150 mg total) by mouth once. 02/25/14   Elby Showers, MD  levothyroxine (SYNTHROID, LEVOTHROID) 100 MCG tablet Take 1 tablet (100 mcg total) by mouth daily before breakfast. 02/25/14   Elby Showers, MD  PANAX GINSENG PO Take 600 mg by mouth daily.    Historical Provider, MD  Prenatal Vit-Fe Fumarate-FA (MULTIVITAMIN-PRENATAL) 27-0.8 MG TABS tablet Take 1 tablet by mouth daily at 12 noon. OTC prenatal    Historical Provider, MD  sertraline (ZOLOFT) 50 MG tablet Take 1 tablet (50  mg total) by mouth daily. Patient taking differently: Take 25 mg by mouth daily.  01/08/14   Elby Showers, MD  triamterene-hydrochlorothiazide (MAXZIDE-25) 37.5-25 MG per tablet take 1 tablet by mouth once daily 03/14/14   Elby Showers, MD   BP 129/90 mmHg  Pulse 81  Temp(Src) 98.7 F (37.1 C) (Oral)  Resp 12  SpO2 97% Physical Exam  Constitutional: She is oriented to person, place, and time. She appears well-developed and well-nourished.  Musculoskeletal: She exhibits tenderness.       Hands: Neurological: She is alert and oriented to person, place, and time.  Skin: Skin is warm.  Nursing note and vitals reviewed.   ED Course  Procedures (including critical care time) Labs Review Labs Reviewed - No data to display  Imaging Review Dg Hand  Complete Right  04/13/2014   CLINICAL DATA:  Hit right hand on truck door. Pain in the second and fifth digits.  EXAM: RIGHT HAND - COMPLETE 3+ VIEW  COMPARISON:  None.  FINDINGS: There is no evidence of fracture or dislocation. There is no evidence of arthropathy or other focal bone abnormality. Soft tissues are unremarkable.  IMPRESSION: No acute bone abnormality to the right hand.   Electronically Signed   By: Markus Daft M.D.   On: 04/13/2014 09:57   X-rays reviewed and report per radiologist.   MDM   1. Hand contusion, right, initial encounter        Billy Fischer, MD 04/13/14 1024

## 2014-04-13 NOTE — ED Notes (Signed)
Pt  sustainned   An injury  To  Her  r  Hand  Yesterday  When  She  Struck  The  Side  Of  A  Truck  Door  By  [employer]     -  She  Has pain /  Swelling  Present  To  The  Affected  Area

## 2014-05-29 ENCOUNTER — Other Ambulatory Visit: Payer: Self-pay | Admitting: *Deleted

## 2014-05-29 DIAGNOSIS — R8271 Bacteriuria: Secondary | ICD-10-CM

## 2014-05-30 ENCOUNTER — Other Ambulatory Visit: Payer: 59

## 2014-07-11 ENCOUNTER — Other Ambulatory Visit: Payer: Self-pay | Admitting: Internal Medicine

## 2014-07-11 NOTE — Telephone Encounter (Signed)
Past due for 6 month recheck

## 2014-07-18 ENCOUNTER — Ambulatory Visit: Payer: Self-pay | Admitting: Internal Medicine

## 2014-08-19 ENCOUNTER — Ambulatory Visit: Payer: 59 | Admitting: Internal Medicine

## 2014-08-20 ENCOUNTER — Ambulatory Visit (INDEPENDENT_AMBULATORY_CARE_PROVIDER_SITE_OTHER): Payer: 59 | Admitting: Internal Medicine

## 2014-08-20 ENCOUNTER — Encounter: Payer: Self-pay | Admitting: Internal Medicine

## 2014-08-20 VITALS — BP 132/84 | HR 74 | Temp 99.1°F | Wt 164.0 lb

## 2014-08-20 DIAGNOSIS — Z8669 Personal history of other diseases of the nervous system and sense organs: Secondary | ICD-10-CM | POA: Diagnosis not present

## 2014-08-20 DIAGNOSIS — F418 Other specified anxiety disorders: Secondary | ICD-10-CM | POA: Diagnosis not present

## 2014-08-20 DIAGNOSIS — I10 Essential (primary) hypertension: Secondary | ICD-10-CM

## 2014-08-20 DIAGNOSIS — F329 Major depressive disorder, single episode, unspecified: Secondary | ICD-10-CM

## 2014-08-20 DIAGNOSIS — E039 Hypothyroidism, unspecified: Secondary | ICD-10-CM | POA: Diagnosis not present

## 2014-08-20 DIAGNOSIS — F32A Depression, unspecified: Secondary | ICD-10-CM

## 2014-08-20 DIAGNOSIS — F419 Anxiety disorder, unspecified: Secondary | ICD-10-CM

## 2014-08-20 LAB — TSH: TSH: 1.806 u[IU]/mL (ref 0.350–4.500)

## 2014-08-20 MED ORDER — TRIAMTERENE-HCTZ 37.5-25 MG PO TABS: 1.0000 | ORAL_TABLET | Freq: Every day | ORAL | Status: DC

## 2014-08-20 MED ORDER — AMITRIPTYLINE HCL 10 MG PO TABS
10.0000 mg | ORAL_TABLET | Freq: Every day | ORAL | Status: DC
Start: 2014-08-20 — End: 2014-10-24

## 2014-08-20 NOTE — Progress Notes (Signed)
   Subjective:    Patient ID: Rebecca Mccoy, female    DOB: 12-17-1965, 50 y.o.   MRN: [id number]  HPI  49 year old Female for follow up of hypothyroidism, HTN, Migraine headaches. Feeling pretty well. TSH drawn. Blood pressure stable at 132/84. No recent migraine headaches. No further recurrence of carbuncle under her chin or elsewhere. Exam neck sparingly for anxiety. Is on Zoloft for anxiety depression.    Review of Systems     Objective:   Physical Exam No thyromegaly. Skin warm and dry. Neck is supple without adenopathy. Chest clear to auscultation. Cardiac exam regular rate and rhythm. Extremities without edema. TSH is within normal limits      Assessment & Plan:  Essential hypertension-stable on current regimen  Hypothyroidism-on thyroid replacement medication  Migraine headaches-not frequent  Anxiety depression-stable with Xanax and Zoloft  Plan: Continue same medications and return in 6 months.

## 2014-08-21 ENCOUNTER — Telehealth: Payer: Self-pay | Admitting: *Deleted

## 2014-08-21 ENCOUNTER — Other Ambulatory Visit: Payer: Self-pay | Admitting: *Deleted

## 2014-08-21 MED ORDER — LEVOTHYROXINE SODIUM 100 MCG PO TABS: 100.0000 ug | ORAL_TABLET | Freq: Every day | ORAL | Status: DC

## 2014-08-21 NOTE — Telephone Encounter (Signed)
Synthroid refilled.

## 2014-08-21 NOTE — Telephone Encounter (Signed)
Left message for patient to call back to review lab work

## 2014-08-21 NOTE — Telephone Encounter (Signed)
Called Dr Burney Gauze office to schedule patient for L trigger thumb. They had an available appt for 08/22/14 at 830 but patient was unable to make that time. Patient given phone number to reschedule appt with her schedule.

## 2014-09-26 ENCOUNTER — Other Ambulatory Visit (INDEPENDENT_AMBULATORY_CARE_PROVIDER_SITE_OTHER): Payer: Commercial Managed Care - HMO | Admitting: Internal Medicine

## 2014-09-26 DIAGNOSIS — R350 Frequency of micturition: Secondary | ICD-10-CM | POA: Diagnosis not present

## 2014-09-26 LAB — POCT URINALYSIS DIPSTICK
BILIRUBIN UA: NEGATIVE
GLUCOSE UA: NEGATIVE
Ketones, UA: NEGATIVE
LEUKOCYTES UA: NEGATIVE
NITRITE UA: NEGATIVE
Protein, UA: NEGATIVE
RBC UA: NEGATIVE
Spec Grav, UA: <=1.005
UROBILINOGEN UA: NEGATIVE
pH, UA: 6.5

## 2014-09-27 ENCOUNTER — Ambulatory Visit (INDEPENDENT_AMBULATORY_CARE_PROVIDER_SITE_OTHER): Payer: Commercial Managed Care - HMO | Admitting: Internal Medicine

## 2014-09-27 ENCOUNTER — Encounter: Payer: Self-pay | Admitting: Internal Medicine

## 2014-09-27 ENCOUNTER — Telehealth: Payer: Self-pay | Admitting: *Deleted

## 2014-09-27 VITALS — BP 108/84 | HR 86 | Temp 98.1°F | Ht 63.0 in | Wt 157.0 lb

## 2014-09-27 DIAGNOSIS — R1030 Lower abdominal pain, unspecified: Secondary | ICD-10-CM

## 2014-09-27 DIAGNOSIS — R1032 Left lower quadrant pain: Secondary | ICD-10-CM

## 2014-09-27 LAB — CBC WITH DIFFERENTIAL/PLATELET
BASOS ABS: 0 10*3/uL (ref 0.0–0.1)
Basophils Relative: 0 % (ref 0–1)
EOS PCT: 2 % (ref 0–5)
Eosinophils Absolute: 0.1 10*3/uL (ref 0.0–0.7)
HCT: 42.6 % (ref 36.0–46.0)
Hemoglobin: 13.9 g/dL (ref 12.0–15.0)
LYMPHS PCT: 31 % (ref 12–46)
Lymphs Abs: 1.6 10*3/uL (ref 0.7–4.0)
MCH: 31.5 pg (ref 26.0–34.0)
MCHC: 32.6 g/dL (ref 30.0–36.0)
MCV: 96.6 fL (ref 78.0–100.0)
MONO ABS: 0.4 10*3/uL (ref 0.1–1.0)
MONOS PCT: 8 % (ref 3–12)
MPV: 10.6 fL (ref 8.6–12.4)
Neutro Abs: 3.1 10*3/uL (ref 1.7–7.7)
Neutrophils Relative %: 59 % (ref 43–77)
Platelets: 291 10*3/uL (ref 150–400)
RBC: 4.41 MIL/uL (ref 3.87–5.11)
RDW: 13.3 % (ref 11.5–15.5)
WBC: 5.3 10*3/uL (ref 4.0–10.5)

## 2014-09-27 LAB — BASIC METABOLIC PANEL
BUN: 18 mg/dL (ref 7–25)
CHLORIDE: 101 mmol/L (ref 98–110)
CO2: 27 mmol/L (ref 20–31)
Calcium: 9.9 mg/dL (ref 8.6–10.2)
Creat: 0.82 mg/dL (ref 0.50–1.10)
GLUCOSE: 87 mg/dL (ref 65–99)
Potassium: 4.3 mmol/L (ref 3.5–5.3)
Sodium: 139 mmol/L (ref 135–146)

## 2014-09-27 NOTE — Telephone Encounter (Signed)
Unable to schedule patient for CT Scan since she had not been NPO for last 4 hours. Patient refused to go to ED for evaluation of her LLQ pain. She states she doesn't feel the symptoms warrant her going to sit in the ED and she doesn't want to have to pay high co payment. Advised patient if she develops fever ,increased pain or nausea and vomiting she needs to go to the ED. Instructed patient to start clear liquid diet and call us Monday to update [place] on her symptoms.

## 2014-10-01 ENCOUNTER — Telehealth: Payer: Self-pay | Admitting: Internal Medicine

## 2014-10-01 DIAGNOSIS — Z029 Encounter for administrative examinations, unspecified: Secondary | ICD-10-CM

## 2014-10-01 MED ORDER — PROMETHAZINE HCL 25 MG PO TABS: 25.0000 mg | ORAL_TABLET | Freq: Three times a day (TID) | ORAL | Status: DC | PRN

## 2014-10-01 MED ORDER — CIPROFLOXACIN HCL 500 MG PO TABS: 500.0000 mg | ORAL_TABLET | Freq: Two times a day (BID) | ORAL | Status: DC

## 2014-10-01 MED ORDER — FLUCONAZOLE 150 MG PO TABS: 150.0000 mg | ORAL_TABLET | Freq: Once | ORAL | Status: DC

## 2014-10-01 NOTE — Telephone Encounter (Signed)
See noted above. Have prescribed Cipro 500 mg twice daily for 10 days, Phenergan 25 mg tablets #20 one by mouth every 8 hours when necessary nausea and Diflucan 150 mg with 1 refill for cruise

## 2014-10-01 NOTE — Telephone Encounter (Signed)
Pt called stated she is not having any additional abdominal symptoms.  She is going to leave for a cruise on 10/03/14 and would like to have Diflucan, an antibiotic and something for nausea.  Pharmacy of choice is [facility].  Best number to reach pt is [phone]

## 2014-10-13 NOTE — Progress Notes (Signed)
   Subjective:    Patient ID: Rebecca Mccoy, female    DOB: 1965-06-19, 49 y.o.   MRN: [id number]  HPI  Husband is recently been treated for urinary tract infection. Patient feels some discomfort in her lower abdomen and has some anxiety about possibly catching the urinary tract infection from him. She had a dipstick urinalysis  yesterday which was normal here. She has no vomiting , diarrhea or nausea. Feels slightly bloated. No significant dysuria. Think she may have a vaginal discharge. They're leaving to go on a cruise soon and she is concerned and doesn't want to get sick while she is away.    Review of Systems     Objective:   Physical Exam  She has some mild abdominal tenderness without rebound. Slightly distended. No vaginitis identified. Bimanual is normal.      Assessment & Plan:  Abdominal pain-etiology unclear. Possible diverticulitis. Possible irritable bowel syndrome. No evidence of UTI.  Plan: Suggested patient get CT of abdomen and pelvis prior to departure on cruise but she doesn't want to do that at the present time. Her urinalysis is normal.

## 2014-10-13 NOTE — Patient Instructions (Addendum)
Consider CT of abdomen and pelvis prior for departing on cruise. CBC and B- met are normal.

## 2014-10-14 NOTE — Patient Instructions (Signed)
Continue same medications and return in 6 months for physical examination. 

## 2014-10-24 ENCOUNTER — Ambulatory Visit (INDEPENDENT_AMBULATORY_CARE_PROVIDER_SITE_OTHER): Payer: Commercial Managed Care - HMO | Admitting: Internal Medicine

## 2014-10-24 ENCOUNTER — Encounter: Payer: Self-pay | Admitting: Internal Medicine

## 2014-10-24 VITALS — BP 104/72 | HR 90 | Temp 98.7°F | Ht 63.0 in | Wt 159.0 lb

## 2014-10-24 DIAGNOSIS — Z23 Encounter for immunization: Secondary | ICD-10-CM

## 2014-10-24 DIAGNOSIS — L237 Allergic contact dermatitis due to plants, except food: Secondary | ICD-10-CM

## 2014-10-24 DIAGNOSIS — R1012 Left upper quadrant pain: Secondary | ICD-10-CM

## 2014-10-24 DIAGNOSIS — F411 Generalized anxiety disorder: Secondary | ICD-10-CM

## 2014-10-24 DIAGNOSIS — Z8669 Personal history of other diseases of the nervous system and sense organs: Secondary | ICD-10-CM

## 2014-10-24 MED ORDER — PREDNISONE 10 MG PO TABS: ORAL_TABLET | ORAL | Status: DC

## 2014-10-24 MED ORDER — SUMATRIPTAN SUCCINATE 100 MG PO TABS: 100.0000 mg | ORAL_TABLET | ORAL | Status: DC | PRN

## 2014-10-24 MED ORDER — AMITRIPTYLINE HCL 25 MG PO TABS: 25.0000 mg | ORAL_TABLET | Freq: Every day | ORAL | Status: DC

## 2014-10-24 MED ORDER — ALPRAZOLAM 0.25 MG PO TABS: 0.2500 mg | ORAL_TABLET | Freq: Two times a day (BID) | ORAL | Status: DC | PRN

## 2014-10-24 NOTE — Progress Notes (Signed)
   Subjective:    Patient ID: Rebecca Mccoy, female    DOB: 04/25/1965, 49 y.o.   MRN: [id number]  HPI Patient originally had scheduled appointment discuss migraine headaches. However, she now has rash on abdomen and legs consistent with poison ivy. Thinks she got it mowing a vineyard. Going to [place] in late October to see her daughter. She had a nice cruise. She has developed some left upper quadrant abdominal pain over the past 24 hours. Has felt nauseated. No urinary symptoms. No fever. No diarrhea.  With regard to migraines, she has several a month. She used to take Zomig when she lived in [place]. [place]. She has none of that take now. She's been taking Elavil 10 mg at bedtime and wakes up with morning headache. Says she thinks she is to take higher dose of Elavil previously.    Review of Systems     Objective:   Physical Exam She has erythematous papular lesions between breast and under right breast in the legs consistent with poison ivy. The 25 minutes discussing medical issues with her.       Assessment & Plan:  History of migraine headaches. Increase Elavil to 25 mg at bedtime. Discussed using Topamax but she wants to try increased dose of Elavil first. Prescription for Imitrex 100 mg at onset of migraine. Not to take too often as it may cause headaches.  Insomnia/anxiety. Refilled Xanax  Poison ivy-steroid dosepak 10 mg  (#21 tablets) going from 60 mg to 0 mg over 7 days  Health maintenance-to get flu vaccine and couple weeks when she is off steroids  Abdominal pain-etiology unclear. Continue to monitor. In September she had left lower quadrant abdominal pain and refused to have CT for further evaluation.  Return in 2 weeks for flu vaccine

## 2014-12-26 ENCOUNTER — Other Ambulatory Visit: Payer: Self-pay

## 2014-12-26 MED ORDER — AMITRIPTYLINE HCL 50 MG PO TABS: 50.0000 mg | ORAL_TABLET | Freq: Every day | ORAL | Status: DC

## 2014-12-26 NOTE — Telephone Encounter (Signed)
Patient states that she will not have enough to get her through to her appt next Thursday 12/15

## 2014-12-30 ENCOUNTER — Other Ambulatory Visit: Payer: Commercial Managed Care - HMO | Admitting: Internal Medicine

## 2014-12-30 DIAGNOSIS — I10 Essential (primary) hypertension: Secondary | ICD-10-CM

## 2014-12-30 DIAGNOSIS — Z0000 Encounter for general adult medical examination without abnormal findings: Secondary | ICD-10-CM

## 2014-12-30 DIAGNOSIS — Z79899 Other long term (current) drug therapy: Secondary | ICD-10-CM

## 2014-12-30 DIAGNOSIS — Z1321 Encounter for screening for nutritional disorder: Secondary | ICD-10-CM

## 2014-12-30 DIAGNOSIS — E039 Hypothyroidism, unspecified: Secondary | ICD-10-CM

## 2014-12-30 LAB — CBC WITH DIFFERENTIAL/PLATELET
Basophils Absolute: 0 10*3/uL (ref 0.0–0.1)
Basophils Relative: 1 % (ref 0–1)
EOS PCT: 2 % (ref 0–5)
Eosinophils Absolute: 0.1 10*3/uL (ref 0.0–0.7)
HEMATOCRIT: 41.6 % (ref 36.0–46.0)
HEMOGLOBIN: 13.5 g/dL (ref 12.0–15.0)
LYMPHS PCT: 42 % (ref 12–46)
Lymphs Abs: 1.8 10*3/uL (ref 0.7–4.0)
MCH: 30.7 pg (ref 26.0–34.0)
MCHC: 32.5 g/dL (ref 30.0–36.0)
MCV: 94.5 fL (ref 78.0–100.0)
MONO ABS: 0.3 10*3/uL (ref 0.1–1.0)
MONOS PCT: 8 % (ref 3–12)
MPV: 9.4 fL (ref 8.6–12.4)
NEUTROS ABS: 2 10*3/uL (ref 1.7–7.7)
Neutrophils Relative %: 47 % (ref 43–77)
Platelets: 293 10*3/uL (ref 150–400)
RBC: 4.4 MIL/uL (ref 3.87–5.11)
RDW: 13.5 % (ref 11.5–15.5)
WBC: 4.2 10*3/uL (ref 4.0–10.5)

## 2014-12-30 LAB — TSH: TSH: 0.956 u[IU]/mL (ref 0.350–4.500)

## 2014-12-30 LAB — LIPID PANEL
Cholesterol: 212 mg/dL — ABNORMAL HIGH (ref 125–200)
HDL: 83 mg/dL (ref >=46)
LDL Cholesterol: 111 mg/dL (ref <130)
Total CHOL/HDL Ratio: 2.6 Ratio (ref <=5.0)
Triglycerides: 92 mg/dL (ref <150)
VLDL: 18 mg/dL (ref <30)

## 2014-12-30 LAB — COMPLETE METABOLIC PANEL WITH GFR
ALBUMIN: 4.2 g/dL (ref 3.6–5.1)
ALK PHOS: 62 U/L (ref 33–115)
ALT: 8 U/L (ref 6–29)
AST: 15 U/L (ref 10–35)
BUN: 15 mg/dL (ref 7–25)
CO2: 31 mmol/L (ref 20–31)
Calcium: 9.8 mg/dL (ref 8.6–10.2)
Chloride: 100 mmol/L (ref 98–110)
Creat: 0.78 mg/dL (ref 0.50–1.10)
GFR, Est African American: >89 mL/min (ref >=60)
GFR, Est Non African American: >89 mL/min (ref >=60)
GLUCOSE: 70 mg/dL (ref 65–99)
POTASSIUM: 4.4 mmol/L (ref 3.5–5.3)
SODIUM: 141 mmol/L (ref 135–146)
TOTAL PROTEIN: 7.1 g/dL (ref 6.1–8.1)
Total Bilirubin: 1.4 mg/dL — ABNORMAL HIGH (ref 0.2–1.2)

## 2014-12-31 ENCOUNTER — Encounter: Payer: 59 | Admitting: Internal Medicine

## 2014-12-31 LAB — VITAMIN D 25 HYDROXY (VIT D DEFICIENCY, FRACTURES): VIT D 25 HYDROXY: 81 ng/mL (ref 30–100)

## 2015-01-02 ENCOUNTER — Encounter: Payer: Self-pay | Admitting: Internal Medicine

## 2015-01-02 ENCOUNTER — Ambulatory Visit (INDEPENDENT_AMBULATORY_CARE_PROVIDER_SITE_OTHER): Payer: Commercial Managed Care - HMO | Admitting: Internal Medicine

## 2015-01-02 VITALS — BP 128/84 | HR 100 | Temp 97.8°F | Resp 20 | Ht 63.0 in | Wt 164.0 lb

## 2015-01-02 DIAGNOSIS — R8290 Unspecified abnormal findings in urine: Secondary | ICD-10-CM

## 2015-01-02 DIAGNOSIS — R51 Headache: Secondary | ICD-10-CM

## 2015-01-02 DIAGNOSIS — Z0000 Encounter for general adult medical examination without abnormal findings: Secondary | ICD-10-CM

## 2015-01-02 DIAGNOSIS — Z8669 Personal history of other diseases of the nervous system and sense organs: Secondary | ICD-10-CM

## 2015-01-02 DIAGNOSIS — Z23 Encounter for immunization: Secondary | ICD-10-CM | POA: Diagnosis not present

## 2015-01-02 DIAGNOSIS — E039 Hypothyroidism, unspecified: Secondary | ICD-10-CM | POA: Diagnosis not present

## 2015-01-02 DIAGNOSIS — R519 Headache, unspecified: Secondary | ICD-10-CM

## 2015-01-02 LAB — POCT URINALYSIS DIPSTICK
BILIRUBIN UA: NEGATIVE
Glucose, UA: NEGATIVE
Ketones, UA: NEGATIVE
Nitrite, UA: NEGATIVE
PH UA: 7
PROTEIN UA: NEGATIVE
SPEC GRAV UA: 1.01
Urobilinogen, UA: 0.2

## 2015-01-02 MED ORDER — PREDNISONE 10 MG PO TABS: ORAL_TABLET | ORAL | Status: DC

## 2015-01-02 MED ORDER — FLUCONAZOLE 150 MG PO TABS: 150.0000 mg | ORAL_TABLET | Freq: Once | ORAL | Status: DC

## 2015-01-02 MED ORDER — LEVOFLOXACIN 500 MG PO TABS: 500.0000 mg | ORAL_TABLET | Freq: Every day | ORAL | Status: DC

## 2015-01-02 NOTE — Progress Notes (Signed)
   Subjective:    Patient ID: KAMDEN POOLER, female    DOB: 12/06/1965, 49 y.o.   MRN: [id number]  HPI 49 year old White Female in today for health maintenance exam and evaluation of medical issues. Long-standing history of migraine headaches. A while back we increased Elavil to 50 mg daily. Still has headache every day upon awakening this uncomfortable and doesn't want to get out of bed. No nausea and vomiting. Eventually wears off after about an hour. Generally takes Elavil about an hour before going to bed at night.    Review of Systems     Objective:   Physical Exam  Constitutional: She is oriented to person, place, and time. She appears well-developed and well-nourished. No distress.  HENT:  Head: Normocephalic and atraumatic.  Right Ear: External ear normal.  Left Ear: External ear normal.  Mouth/Throat: Oropharynx is clear and moist. No oropharyngeal exudate.  Eyes: Conjunctivae and EOM are normal. Pupils are equal, round, and reactive to light. Right eye exhibits no discharge. Left eye exhibits no discharge. No scleral icterus.  Neck: Neck supple. No JVD present. No thyromegaly present.  Cardiovascular: Normal rate, regular rhythm, normal heart sounds and intact distal pulses.   No murmur heard. Breasts normal female with implants. Needs mammogram.  Pulmonary/Chest: Effort normal and breath sounds normal. No respiratory distress. She has no wheezes. She has no rales. She exhibits no tenderness.  Abdominal: Soft. Bowel sounds are normal. She exhibits no distension and no mass. There is no tenderness. There is no rebound and no guarding.  Genitourinary:  Pap done 2015. Bimanual normal.  Musculoskeletal: She exhibits no edema.  Lymphadenopathy:    She has no cervical adenopathy.  Neurological: She is alert and oriented to person, place, and time. She has normal reflexes. She displays normal reflexes. No cranial nerve deficit. Coordination normal.  Skin: Skin is warm and dry. No  rash noted. She is not diaphoretic.  Psychiatric: She has a normal mood and affect. Her behavior is normal. Judgment and thought content normal.  Vitals reviewed.         Assessment & Plan:  History of migraine headaches  Chronic daily headache  Possible sinusitis  Plan: Sterapred DS 10 mg 6 day dosepak. Levaquin 500 milligrams daily for 10 days. Diflucan 150 mg tablet if needed for Candida vaginitis while on antibiotics. If not getting better after the holidays, give consideration to Topamax or perhaps SSRI instead of Elavil.

## 2015-01-02 NOTE — Patient Instructions (Addendum)
It was a pleasure to see you today. Try Sterapred 10 mg 6 day dosepak and Levaquin 500 milligrams daily for 10 days. Diflucan ordered in case she developed a yeast infection while on antibiotics. If headaches do not improve after the holidays, return for further follow-up. Other considerations include Topamax or SSRI medication

## 2015-01-06 LAB — CULTURE, URINE COMPREHENSIVE

## 2015-01-09 ENCOUNTER — Ambulatory Visit (INDEPENDENT_AMBULATORY_CARE_PROVIDER_SITE_OTHER): Payer: Commercial Managed Care - HMO | Admitting: Internal Medicine

## 2015-01-09 ENCOUNTER — Encounter: Payer: Self-pay | Admitting: Internal Medicine

## 2015-01-09 VITALS — BP 112/78 | HR 105 | Temp 98.0°F | Resp 20 | Ht 63.0 in | Wt 164.0 lb

## 2015-01-09 DIAGNOSIS — Z8614 Personal history of Methicillin resistant Staphylococcus aureus infection: Secondary | ICD-10-CM

## 2015-01-09 DIAGNOSIS — R51 Headache: Secondary | ICD-10-CM

## 2015-01-09 DIAGNOSIS — L02234 Carbuncle of groin: Secondary | ICD-10-CM | POA: Diagnosis not present

## 2015-01-09 DIAGNOSIS — R519 Headache, unspecified: Secondary | ICD-10-CM

## 2015-01-09 DIAGNOSIS — Z8669 Personal history of other diseases of the nervous system and sense organs: Secondary | ICD-10-CM | POA: Diagnosis not present

## 2015-01-09 MED ORDER — DOXYCYCLINE HYCLATE 100 MG PO TABS: 100.0000 mg | ORAL_TABLET | Freq: Two times a day (BID) | ORAL | Status: DC

## 2015-01-09 MED ORDER — HYDROCODONE-ACETAMINOPHEN 5-325 MG PO TABS: 1.0000 | ORAL_TABLET | Freq: Four times a day (QID) | ORAL | Status: DC | PRN

## 2015-01-09 MED ORDER — TOPIRAMATE 25 MG PO TABS: ORAL_TABLET | ORAL | Status: DC

## 2015-01-15 NOTE — Progress Notes (Signed)
   Subjective:    Patient ID: Rebecca Mccoy, female    DOB: [birth year], 49 y.o.   MRN: [id number]  HPI Patient in today with persistent chronic daily headache/migraine headaches despite taking Elavil at bedtime. Did not see any relief from course of prednisone given recently. No more stress than usual. 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 She also has a lesion in her right groin area that looks to be a carbuncle/MRSA. She has a history of MRSA in the remote past.    Review of Systems     Objective:   Physical Exam Erythematous carbuncle right groin area. Spent 25 minutes speaking with her about headache management. Given food list to see if foods are causing headaches.       Assessment & Plan:  Chronic daily headache-awakens every morning with headache. No nausea vomiting or visual disturbance.  History of migraine headaches  Carbuncle right groin consistent with MRSA  Plan: Discontinue Elavil. Start Topamax 25 mg daily and increase to 50 mg daily in the next couple of weeks. Watch food intake to see if foods are causing headaches based on headache food list. Keep headache diary. Doxycycline 100 mg twice daily for 10 days. Return January 12 for follow-up. Norco 5/325 one by mouth every 8 hours when necessary headache.

## 2015-01-15 NOTE — Patient Instructions (Addendum)
Begin Topamax for prevention of migraine headaches. Norco to break headache cycle. Doxycycline 100 mg twice daily for 10 days for MRSA/carbuncle. Return in 3 weeks.

## 2015-01-19 HISTORY — PX: COLONOSCOPY: SHX174

## 2015-01-30 ENCOUNTER — Encounter: Payer: Self-pay | Admitting: Internal Medicine

## 2015-01-30 ENCOUNTER — Ambulatory Visit (INDEPENDENT_AMBULATORY_CARE_PROVIDER_SITE_OTHER): Payer: Commercial Managed Care - HMO | Admitting: Internal Medicine

## 2015-01-30 VITALS — BP 130/78 | HR 98 | Temp 98.6°F | Resp 20 | Ht 63.0 in | Wt 160.0 lb

## 2015-01-30 DIAGNOSIS — Z8669 Personal history of other diseases of the nervous system and sense organs: Secondary | ICD-10-CM

## 2015-01-30 MED ORDER — TOPIRAMATE 25 MG PO TABS: ORAL_TABLET | ORAL | Status: DC

## 2015-01-30 MED ORDER — LEVOTHYROXINE SODIUM 100 MCG PO TABS: 100.0000 ug | ORAL_TABLET | Freq: Every day | ORAL | Status: DC

## 2015-01-30 NOTE — Patient Instructions (Signed)
Take 50 mg daily of Topamax. Restart Elavil at 50 mg daily.Call in 3-4 weeks with progress report.

## 2015-02-04 NOTE — Progress Notes (Signed)
   Subjective:    Patient ID: Rebecca Mccoy, female    DOB: 23-Apr-1965, 50 y.o.   MRN: [id number]  HPI Patient seen December 22 having daily headaches despite being on Elavil 50 mg daily. Long-standing history of migraine headaches. Some stress at work. She was started on Topamax 25 mg daily. Still having frequent headaches. Not sleeping as well off Elavil. Also recently treated for carbuncle with doxycycline and it has resolved.   Review of Systems      Objective:   Physical Exam  Not examined. Spent 15 minutes speaking with her about headache management. Has not seen any particular relationship to foods.       Assessment & Plan:  History of migraine headaches  Chronic daily headache  Plan: Increase Topamax to 50 mg daily. Restart Elavil 50 mg at bedtime. Call with progress report in 3-4 weeks or call sooner if worse.continue to keep headache diary. Has Norco on hand for severe headache.

## 2015-02-15 ENCOUNTER — Other Ambulatory Visit: Payer: Self-pay | Admitting: Internal Medicine

## 2015-05-01 ENCOUNTER — Ambulatory Visit (INDEPENDENT_AMBULATORY_CARE_PROVIDER_SITE_OTHER): Payer: Commercial Managed Care - HMO | Admitting: Internal Medicine

## 2015-05-01 ENCOUNTER — Encounter: Payer: Self-pay | Admitting: Internal Medicine

## 2015-05-01 ENCOUNTER — Telehealth: Payer: Self-pay

## 2015-05-01 VITALS — BP 126/78 | HR 91 | Temp 97.9°F | Resp 20 | Ht 63.0 in | Wt 169.0 lb

## 2015-05-01 DIAGNOSIS — Z8614 Personal history of Methicillin resistant Staphylococcus aureus infection: Secondary | ICD-10-CM

## 2015-05-01 DIAGNOSIS — L02439 Carbuncle of limb, unspecified: Secondary | ICD-10-CM

## 2015-05-01 DIAGNOSIS — M545 Low back pain: Secondary | ICD-10-CM | POA: Diagnosis not present

## 2015-05-01 MED ORDER — MUPIROCIN 2 % EX OINT: 1.0000 "application " | TOPICAL_OINTMENT | Freq: Two times a day (BID) | CUTANEOUS | Status: DC

## 2015-05-01 MED ORDER — DOXYCYCLINE HYCLATE 100 MG PO TABS: 100.0000 mg | ORAL_TABLET | Freq: Two times a day (BID) | ORAL | Status: DC

## 2015-05-01 MED ORDER — HYDROCODONE-ACETAMINOPHEN 10-325 MG PO TABS: 1.0000 | ORAL_TABLET | Freq: Three times a day (TID) | ORAL | Status: DC | PRN

## 2015-05-01 NOTE — Telephone Encounter (Signed)
Needs OV.  

## 2015-05-01 NOTE — Telephone Encounter (Signed)
Patient states that she has a place on her upper thigh that is burning and is very hot to touch. She states that she is prone to MRSA. She is wanting to know that due to her history if you can call in an antibiotic for it and diflucan for yeast? She is just getting home from out of town for work. Please advise.   [facility] [address].

## 2015-05-01 NOTE — Telephone Encounter (Signed)
Pt to be seen at 3:45

## 2015-05-17 NOTE — Patient Instructions (Addendum)
Use Bactroban in nostrils nightly for month. Hibiclens soap daily. Doxycycline 100 mg twice daily for 14 days. Hydrocodone/APAP 1 by mouth every 8 hours when necessary back pain.

## 2015-05-17 NOTE — Progress Notes (Signed)
   Subjective:    Patient ID: Rebecca Mccoy, female    DOB: Mar 21, 1965, 50 y.o.   MRN: [id number]  HPI For about a week has been noticing erythematous tender area right thigh. Patient is  wondering if she has recurrent MRSA. Has prior history of MRSA. Has been out of town traveling. Also experiencing some low back pain. No fever or chills. No significant radiation of back pain. Back is stiff. Driving aggravates it. No numbness or tingling in extremities.    Review of Systems as above     Objective:   Physical Exam  Straight leg raising is negative at 90 bilaterally. Muscle strength is normal in the lower extremities. No foot drop. She has erythematous area right anterior thigh consistent with carbuncle without drainage      Assessment & Plan:  Carbuncle right thigh-recurrent MRSA  Low back pain  Plan: Doxycycline 100 mg twice daily for 14 days. Hydrocodone/APAP 1 by mouth every 8 hours when necessary back pain. Bactroban ointment to nostrils nightly for 30 days.

## 2015-06-09 ENCOUNTER — Other Ambulatory Visit: Payer: Self-pay

## 2015-06-09 MED ORDER — LEVOTHYROXINE SODIUM 100 MCG PO TABS: 100.0000 ug | ORAL_TABLET | Freq: Every day | ORAL | Status: DC

## 2015-06-25 ENCOUNTER — Other Ambulatory Visit: Payer: Self-pay

## 2015-06-25 MED ORDER — TOPIRAMATE 25 MG PO TABS: ORAL_TABLET | ORAL | Status: DC

## 2015-06-25 NOTE — Telephone Encounter (Signed)
Per Dr Renold Genta

## 2015-07-16 ENCOUNTER — Other Ambulatory Visit: Payer: Self-pay

## 2015-07-16 MED ORDER — AMITRIPTYLINE HCL 50 MG PO TABS: 50.0000 mg | ORAL_TABLET | Freq: Every day | ORAL | Status: DC

## 2015-07-16 NOTE — Telephone Encounter (Signed)
Per Dr. Renold Genta

## 2015-07-29 ENCOUNTER — Other Ambulatory Visit: Payer: Self-pay

## 2015-07-29 MED ORDER — TRIAMTERENE-HCTZ 37.5-25 MG PO TABS: 1.0000 | ORAL_TABLET | Freq: Every day | ORAL | Status: DC

## 2015-08-15 ENCOUNTER — Encounter: Payer: Self-pay | Admitting: Internal Medicine

## 2015-08-15 ENCOUNTER — Ambulatory Visit (INDEPENDENT_AMBULATORY_CARE_PROVIDER_SITE_OTHER): Payer: Commercial Managed Care - HMO | Admitting: Internal Medicine

## 2015-08-15 VITALS — BP 114/78 | HR 87 | Temp 97.8°F | Ht 63.0 in | Wt 172.0 lb

## 2015-08-15 DIAGNOSIS — Z8669 Personal history of other diseases of the nervous system and sense organs: Secondary | ICD-10-CM

## 2015-08-15 DIAGNOSIS — F411 Generalized anxiety disorder: Secondary | ICD-10-CM

## 2015-08-15 MED ORDER — AMITRIPTYLINE HCL 50 MG PO TABS: 50.0000 mg | ORAL_TABLET | Freq: Every day | ORAL | 5 refills | Status: DC

## 2015-08-15 MED ORDER — SERTRALINE HCL 50 MG PO TABS: ORAL_TABLET | ORAL | 1 refills | Status: DC

## 2015-08-16 NOTE — Progress Notes (Signed)
   Subjective:    Patient ID: Rebecca Mccoy, female    DOB: Aug 24, 1965, 50 y.o.   MRN: [id number]  HPI  50 year old Female in today for follow-up on migraine headaches and anxiety issues. She is currently taking Elavil 50 mg daily. It has really helped control her headaches quite a bit. She is sleeping well. I also prescribed low-dose Topamax 25 mg daily in addition to Elavil and headaches have really settled down quite a bit.   However, she's having some anxiety issues related to stress at work. 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 She has travel some.  She has Xanax on hand but doesn't like to take that very often. Would like to have something such as an SSRI to take daily it would not cause weight gain to deal with anxiety.    Review of Systems no new symptoms with the exception of feeling anxious and a bit overwhelmed     Objective:   Physical Exam  Spent 20 minutes speaking with her about these issues of anxiety and being overwhelmed. She realizes that situational and somewhat time sensitive since it is a busy time of year for enrollment in daycare      Assessment & Plan:  Anxiety  History of migraine headaches  History of chronic daily headache-resolved with Elavil and Topamax  Plan: Patient will start Zoloft 50 mg daily and return in 4-6 weeks. We may need increased to 100 mg daily. She will continue current medications.

## 2015-08-16 NOTE — Patient Instructions (Signed)
Start Zoloft 50 mg daily and return in 4-6 weeks. Continue other medications as previously prescribed.

## 2015-09-26 ENCOUNTER — Encounter: Payer: Self-pay | Admitting: Internal Medicine

## 2015-09-26 ENCOUNTER — Ambulatory Visit (INDEPENDENT_AMBULATORY_CARE_PROVIDER_SITE_OTHER): Payer: Commercial Managed Care - HMO | Admitting: Internal Medicine

## 2015-09-26 VITALS — BP 112/84 | HR 107 | Temp 98.2°F | Ht 63.0 in | Wt 173.0 lb

## 2015-09-26 DIAGNOSIS — Z8669 Personal history of other diseases of the nervous system and sense organs: Secondary | ICD-10-CM

## 2015-09-26 DIAGNOSIS — I10 Essential (primary) hypertension: Secondary | ICD-10-CM | POA: Diagnosis not present

## 2015-09-26 DIAGNOSIS — L0293 Carbuncle, unspecified: Secondary | ICD-10-CM

## 2015-09-26 DIAGNOSIS — F411 Generalized anxiety disorder: Secondary | ICD-10-CM | POA: Diagnosis not present

## 2015-09-26 DIAGNOSIS — E039 Hypothyroidism, unspecified: Secondary | ICD-10-CM

## 2015-09-26 MED ORDER — LEVOTHYROXINE SODIUM 100 MCG PO TABS: 100.0000 ug | ORAL_TABLET | Freq: Every day | ORAL | 5 refills | Status: DC

## 2015-09-26 MED ORDER — FLUCONAZOLE 150 MG PO TABS
150.0000 mg | ORAL_TABLET | Freq: Once | ORAL | 0 refills | Status: AC
Start: 2015-09-26 — End: 2015-09-26

## 2015-09-26 MED ORDER — SERTRALINE HCL 100 MG PO TABS: 100.0000 mg | ORAL_TABLET | Freq: Every day | ORAL | 5 refills | Status: DC

## 2015-09-26 MED ORDER — DOXYCYCLINE HYCLATE 100 MG PO TABS: 100.0000 mg | ORAL_TABLET | Freq: Two times a day (BID) | ORAL | 0 refills | Status: DC
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
## 2015-09-26 NOTE — Patient Instructions (Signed)
Levothyroxine refilled. Doxycycline 100 mg twice daily for 10 days for carbuncle. Diflucan if needed for Candida vaginitis. Increase Zoloft to 100 mg daily for anxiety. Follow-up in 3-6 months.

## 2015-11-07 ENCOUNTER — Other Ambulatory Visit: Payer: Self-pay

## 2015-11-07 MED ORDER — TRIAMTERENE-HCTZ 37.5-25 MG PO TABS: 1.0000 | ORAL_TABLET | Freq: Every day | ORAL | 1 refills | Status: DC

## 2015-12-03 ENCOUNTER — Encounter: Payer: Self-pay | Admitting: Internal Medicine

## 2015-12-03 ENCOUNTER — Encounter: Payer: Self-pay | Admitting: Physician Assistant

## 2015-12-03 ENCOUNTER — Ambulatory Visit (INDEPENDENT_AMBULATORY_CARE_PROVIDER_SITE_OTHER): Payer: Commercial Managed Care - HMO | Admitting: Internal Medicine

## 2015-12-03 ENCOUNTER — Ambulatory Visit
Admission: RE | Admit: 2015-12-03 | Discharge: 2015-12-03 | Disposition: A | Discharge status: Home / Self Care | Payer: 59 | Source: Ambulatory Visit | Attending: Internal Medicine | Admitting: Internal Medicine

## 2015-12-03 ENCOUNTER — Telehealth: Payer: Self-pay | Admitting: Internal Medicine

## 2015-12-03 VITALS — BP 118/82 | HR 82 | Temp 97.3°F | Wt 179.0 lb

## 2015-12-03 DIAGNOSIS — E039 Hypothyroidism, unspecified: Secondary | ICD-10-CM | POA: Diagnosis not present

## 2015-12-03 DIAGNOSIS — R1032 Left lower quadrant pain: Secondary | ICD-10-CM | POA: Diagnosis not present

## 2015-12-03 DIAGNOSIS — L659 Nonscarring hair loss, unspecified: Secondary | ICD-10-CM

## 2015-12-03 HISTORY — DX: Hypothyroidism, unspecified: E03.9

## 2015-12-03 LAB — COMPLETE METABOLIC PANEL WITH GFR
ALK PHOS: 61 U/L (ref 33–130)
ALT: 13 U/L (ref 6–29)
AST: 18 U/L (ref 10–35)
Albumin: 4.3 g/dL (ref 3.6–5.1)
BUN: 16 mg/dL (ref 7–25)
CHLORIDE: 102 mmol/L (ref 98–110)
CO2: 31 mmol/L (ref 20–31)
Calcium: 9.7 mg/dL (ref 8.6–10.4)
Creat: 0.95 mg/dL (ref 0.50–1.05)
GFR, EST NON AFRICAN AMERICAN: 70 mL/min (ref >=60)
GFR, Est African American: 81 mL/min (ref >=60)
GLUCOSE: 86 mg/dL (ref 65–99)
POTASSIUM: 4.3 mmol/L (ref 3.5–5.3)
SODIUM: 139 mmol/L (ref 135–146)
Total Bilirubin: 0.9 mg/dL (ref 0.2–1.2)
Total Protein: 7.2 g/dL (ref 6.1–8.1)

## 2015-12-03 LAB — CBC WITH DIFFERENTIAL/PLATELET
BASOS ABS: 0 {cells}/uL (ref 0–200)
Basophils Relative: 0 %
EOS PCT: 4 %
Eosinophils Absolute: 168 cells/uL (ref 15–500)
HCT: 42.3 % (ref 35.0–45.0)
Hemoglobin: 14.1 g/dL (ref 11.7–15.5)
Lymphocytes Relative: 37 %
Lymphs Abs: 1554 cells/uL (ref 850–3900)
MCH: 31.6 pg (ref 27.0–33.0)
MCHC: 33.3 g/dL (ref 32.0–36.0)
MCV: 94.8 fL (ref 80.0–100.0)
MONOS PCT: 8 %
MPV: 9.6 fL (ref 7.5–12.5)
Monocytes Absolute: 336 cells/uL (ref 200–950)
NEUTROS PCT: 51 %
Neutro Abs: 2142 cells/uL (ref 1500–7800)
PLATELETS: 273 10*3/uL (ref 140–400)
RBC: 4.46 MIL/uL (ref 3.80–5.10)
RDW: 13.7 % (ref 11.0–15.0)
WBC: 4.2 10*3/uL (ref 3.8–10.8)

## 2015-12-03 LAB — T4, FREE: Free T4: 1.3 ng/dL (ref 0.8–1.8)

## 2015-12-03 LAB — POCT URINALYSIS DIPSTICK
Bilirubin, UA: NEGATIVE
Blood, UA: NEGATIVE
Glucose, UA: NEGATIVE
KETONES UA: NEGATIVE
Nitrite, UA: NEGATIVE
PH UA: 8
PROTEIN UA: NEGATIVE
UROBILINOGEN UA: NEGATIVE

## 2015-12-03 LAB — TSH: TSH: 3.22 mIU/L

## 2015-12-03 MED ORDER — LEVOTHYROXINE SODIUM 112 MCG PO TABS: 112.0000 ug | ORAL_TABLET | Freq: Every day | ORAL | 3 refills | Status: DC

## 2015-12-03 MED ORDER — IOPAMIDOL (ISOVUE-300) INJECTION 61%
100.0000 mL | Freq: Once | INTRAVENOUS | Status: AC | PRN
  Administered 2015-12-03: 100 mL via INTRAVENOUS

## 2015-12-03 NOTE — Progress Notes (Signed)
Onset LLQ pain 2-3 months ago.  For past 2 months, would have 24 hour episodes of abdominal pain that would spontaneously resolve.   Yesterday, went to [facility] and was diagnosed with diverticulitis. She is here for follow up. Has not had CT of abdomen. Was prescribed Bactrim and Flagyl but has not started yet. Stayed with clear liquids last pm.   Has been having hair loss for several months. She is worried about thyroid replacement medication dosage.   Has loose stools with the pain. No blood in BM. Sometimes is constipated which is new for her. No vomiting. No fever or shaking chills.   Objective: Bowel sounds are decreased and nearly absent. Abdomen is soft and nondistended. Patient is tender in the left lower quadrant with palpation and has rebound tenderness. Bimanual exam is normal. Dipstick urine is normal. CBC with differential drawn and pending. TSH and free T4 drawn and pending. C met drawn and pending.  Assessment:  Acute diverticulitis  Hair loss  Plan: Stat CT of the abdomen and pelvis to evaluate for acute diverticulitis. Lab work drawn and pending.  Addendum: Patient has no evidence of acute diverticulitis. No obstructing kidney stone. Called patient and reported this to her. There is moderate stool throughout the colon. I have recommended a Dulcolax suppository nail and tonight she is to take 2 Senokot tablets. Then she is to start MiraLAX daily. We will schedule her for colonoscopy in the near future. Constipation is new to her. Her TSH is slightly over 3 which is higher than it was 11 months ago. Were going to increase her levothyroxine to 0.112 mg daily. She's coming for physical exam in January and we will follow-up with repeat TSH at that time. Her urine culture is pending.              Plan:

## 2015-12-03 NOTE — Addendum Note (Signed)
Addended by: Amado Coe on: 12/03/2015 04:51 PM   Modules accepted: Orders

## 2015-12-03 NOTE — Telephone Encounter (Signed)
STAT CT of the Abdomen/Pelvis ordered to rule out Acute Diverticulitis.  Spoke with Mrs. Lable at [place] @ 1037; transferred to [facility].  Dx:  R10.32 (acute LLQ abdominal pain).  Auth [id number]; effective 12/03/15 - 01/17/16.  Procedure Code:  [id number].    Called Auth number to [facility] and provided auth #.  Also put Auth # in order field in edit notes section in EPIC.   Patient to have STAT CT today at [facility].  Walk-in CT today.    Stat CBC w/Diff pending.

## 2015-12-03 NOTE — Patient Instructions (Addendum)
To have stat CT of abdomen and pelvis today. Lab work drawn and pending.  Use Dulcolax suppository once now and take 2 Senokot tablets at bedtime. Tomorrow start MiraLAX daily. Increase levothyroxine to 0.112 mg daily. Will repeat TSH at time of physical exam in January. We will put in referral for colonoscopy. Do not start antibiotics previously prescribed urgent care. Urine culture pending. May advance to soft diet tonight and tomorrow.

## 2015-12-04 ENCOUNTER — Telehealth: Payer: Self-pay | Admitting: Internal Medicine

## 2015-12-04 LAB — URINE CULTURE

## 2015-12-04 NOTE — Telephone Encounter (Signed)
Called to check in - states that she did what you advised regarding the suppositories.  Not the most pleasant experience.  She will keep the appointment with  GI next Wednesday @ 10:30.   On her drive down on her trip today, she states she did not have any sharp pains, she is still tender, but she is NOT any worse.   She will continue to await the Urine Culture results.  Advised that will take a few days.  We will call her with results.

## 2015-12-05 ENCOUNTER — Ambulatory Visit: Payer: Commercial Managed Care - HMO | Admitting: Internal Medicine

## 2015-12-05 ENCOUNTER — Telehealth: Payer: Self-pay | Admitting: Internal Medicine

## 2015-12-05 NOTE — Telephone Encounter (Signed)
Left message for patient to call office regarding labs.

## 2015-12-05 NOTE — Telephone Encounter (Signed)
Patient returned the call.  Advised that her CBC was normal.  And, she did not have UTI.  Advised if she needs anything further, please feel free to call the office with any questions.

## 2015-12-10 ENCOUNTER — Encounter: Payer: Self-pay | Admitting: Physician Assistant

## 2015-12-10 ENCOUNTER — Ambulatory Visit (INDEPENDENT_AMBULATORY_CARE_PROVIDER_SITE_OTHER): Payer: 59 | Admitting: Physician Assistant

## 2015-12-10 VITALS — BP 122/92 | HR 80 | Ht 63.0 in | Wt 180.4 lb

## 2015-12-10 DIAGNOSIS — R1032 Left lower quadrant pain: Secondary | ICD-10-CM

## 2015-12-10 MED ORDER — NA SULFATE-K SULFATE-MG SULF 17.5-3.13-1.6 GM/177ML PO SOLN: ORAL | 0 refills | Status: DC

## 2015-12-10 NOTE — Progress Notes (Signed)
Subjective:    Patient ID: Rebecca Mccoy, female    DOB: 1965/10/15, 50 y.o.   MRN: [id number]  HPI Rebecca Mccoy is a pleasant 50 year old white female, new to GI today referred by Dr. Tedra Senegal for left lower quadrant pain and consideration of colonoscopy. Patient says that she has had her current symptoms over the past 3-4 months and has been having intermittent episodes of sharp stabbing left lower quadrant pain  that she describes as feeling like a "knife is sticking in my side". She says these symptoms usually lasts for less than 24 hours and then gradually resolved. She doesn't feel well during this time and is somewhat nauseated but doesn't vomit. She's not had any associated fever . She does note that her stools are somewhat looser during this time, no blood. She has had 4-5 episodes altogether and feels fine in between. She says his symptoms remind her of how she has felt with kidney stones though usually she also has urinary urgency with kidney stones as well. She had been seen at an urgent care in early November.possibly to have diverticulitis, started antibiotics but then went to Dr. Renold Genta who did CT of the abdomen and pelvis Patient also has ongoing mild reflux symptoms. She's been taking OTC Prilosec with good result. She has no complaints of dysphagia or odynophagia. She says she did have one endoscopy done about 10 years ago at [facility] and this was negative. Family history pertinent for mother with colon polyps no family history of colon cancer.  Review of Systems Pertinent positive and negative review of systems were noted in the above HPI section.  All other review of systems was otherwise negative.  Outpatient Encounter Prescriptions as of 12/10/2015  Medication Sig  . ALPRAZolam (XANAX) 0.25 MG tablet Take 1 tablet (0.25 mg total) by mouth 2 (two) times daily as needed for anxiety. (Patient taking differently: Take 0.25 mg by mouth as needed for anxiety. )  . amitriptyline  (ELAVIL) 50 MG tablet Take 1 tablet (50 mg total) by mouth at bedtime.  Marland Kitchen azelastine (OPTIVAR) 0.05 % ophthalmic solution   . Biotin 5000 MCG CAPS Take 1 capsule by mouth daily.  Marland Kitchen levothyroxine (SYNTHROID, LEVOTHROID) 112 MCG tablet Take 1 tablet (112 mcg total) by mouth daily.  . mupirocin ointment (BACTROBAN) 2 % Place 1 application into the nose 2 (two) times daily. (Patient taking differently: Place 1 application into the nose as needed. )  . PANAX GINSENG PO Take 600 mg by mouth daily.  . Prenatal Vit-Fe Fumarate-FA (MULTIVITAMIN-PRENATAL) 27-0.8 MG TABS tablet Take 1 tablet by mouth daily at 12 noon. OTC prenatal  . sertraline (ZOLOFT) 100 MG tablet Take 1 tablet (100 mg total) by mouth daily.  . SUMAtriptan (IMITREX) 100 MG tablet Take 1 tablet (100 mg total) by mouth every 2 (two) hours as needed for migraine. May repeat in 2 hours if headache persists or recurs. (Patient taking differently: Take 100 mg by mouth as needed for migraine. May repeat in 2 hours if headache persists or recurs.)  . topiramate (TOPAMAX) 25 MG tablet 2 po daily for prevention of migraine headaches  . triamterene-hydrochlorothiazide (MAXZIDE-25) 37.5-25 MG tablet Take 1 tablet by mouth daily.  . Na Sulfate-K Sulfate-Mg Sulf 17.5-3.13-1.6 GM/180ML SOLN Take as directed for colonoscopy prep.   No facility-administered encounter medications on file as of 12/10/2015.    No Known Allergies Patient Active Problem List   Diagnosis Date Noted  . Hypothyroidism 12/03/2015  . Carbuncle  02/17/2014  . Anxiety and depression 02/17/2014  . History of migraine headaches 02/17/2014   Social History   Social History  . Marital status: Married    Spouse name: N/A  . Number of children: 2  . Years of education: N/A   Occupational History  . Operations Support Specialist    Social History Main Topics  . Smoking status: Never Smoker  . Smokeless tobacco: Never Used  . Alcohol use 0.0 oz/week     Comment: occasional    . Drug use: No  . Sexual activity: Yes    Birth control/ protection: IUD, Surgical     Comment: INTERCOUSE AGE 3, SEXUAL PARTNERS MORE THAN 5   Other Topics Concern  . Not on file   Social History Narrative  . No narrative on file    Ms. Rebecca Mccoy's family history includes Colon polyps in her mother; Crohn's disease in her cousin; Diabetes in her mother; Pancreatic cancer in her father; Uterine cancer in her mother.      Objective:    Vitals:   12/10/15 1019  BP: (!) 122/92  Pulse: 80    Physical Exam  well-developed white female in no acute distress, pleasant blood pressure 122/92 pulse 80, BMI 31.9. HEENT; nontraumatic normocephalic EOMI PERRLA sclera anicteric, Cardiovascular; regular rate and rhythm with S1-S2 no murmur or gallop, Pulmonary; clear bilaterally, Abdomen; soft, bowel sounds are present she has mild tenderness in the left mid left lower quadrant there is no guarding or rebound no palpable mass or hepatosplenomegaly bowel sounds present, she is status post abdominoplasty Rectal ;exam not done  Ext; no clubbing cyanosis or edema skin warm and dry, Neuropsych; mood and affect appropriate       Assessment & Plan:   #88 50 year old white female with 3-4 month history of episodes of sharp left lower quadrant pain lasting for around 24 hours, associated with looser stool and nausea. Recent CT scan negative for diverticulitis, she does have sigmoid diverticulosis and had a moderate amount of stool throughout the colon. Appetite rule out symptomatic diverticular disease, rule out occult colon lesion, rule out spasm #2 GERD-will controlled with OTC Prilosec  # 3 status post abdominoplasty  Plan; Patient will be scheduled for colonoscopy with Dr. Ardis Hughs. Procedure was discussed in detail with the patient including risks and benefits and she is agreeable to proceed. Further recommendations pending results of colonoscopy.   Amy Genia Harold PA-C 12/10/2015   Cc: Elby Showers, MD

## 2015-12-10 NOTE — Patient Instructions (Signed)
You have been scheduled for a colonoscopy. Please follow written instructions given to you at your visit today.  Please pick up your prep supplies at the pharmacy within the next 1-3 days. [facility] [address]  If you use inhalers (even only as needed), please bring them with you on the day of your procedure. Your physician has requested that you go to www.startemmi.com and enter the access code given to you at your visit today. This web site gives a general overview about your procedure. However, you should still follow specific instructions given to you by our office regarding your preparation for the procedure.

## 2015-12-14 NOTE — Progress Notes (Signed)
I agree with the above note, plan 

## 2015-12-19 ENCOUNTER — Encounter: Payer: Self-pay | Admitting: Gastroenterology

## 2015-12-19 ENCOUNTER — Ambulatory Visit (AMBULATORY_SURGERY_CENTER): Payer: 59 | Admitting: Gastroenterology

## 2015-12-19 VITALS — BP 116/75 | HR 79 | Temp 97.5°F | Resp 11 | Ht 63.0 in | Wt 180.0 lb

## 2015-12-19 DIAGNOSIS — R1032 Left lower quadrant pain: Secondary | ICD-10-CM | POA: Diagnosis present

## 2015-12-19 DIAGNOSIS — D122 Benign neoplasm of ascending colon: Secondary | ICD-10-CM

## 2015-12-19 MED ORDER — SODIUM CHLORIDE 0.9 % IV SOLN: 500.0000 mL | INTRAVENOUS | Status: DC

## 2015-12-19 NOTE — Op Note (Signed)
[facility] Patient Name: Rebecca Mccoy Procedure Date: 12/19/2015 9:56 AM MRN: [id number] Endoscopist: Milus Banister , MD Age: 50 Referring MD:  Date of Birth: 1965/04/26 Gender: Female Account #: [id number] Procedure:                Colonoscopy Indications:              Abdominal pain in the left lower quadrant Medicines:                Monitored Anesthesia Care Procedure:                Pre-Anesthesia Assessment:                           - Prior to the procedure, a History and Physical                            was performed, and patient medications and                            allergies were reviewed. The patient's tolerance of                            previous anesthesia was also reviewed. The risks                            and benefits of the procedure and the sedation                            options and risks were discussed with the patient.                            All questions were answered, and informed consent                            was obtained. Prior Anticoagulants: The patient has                            taken no previous anticoagulant or antiplatelet                            agents. ASA Grade Assessment: II - A patient with                            mild systemic disease. After reviewing the risks                            and benefits, the patient was deemed in                            satisfactory condition to undergo the procedure.                           After obtaining informed consent, the colonoscope  was passed under direct vision. Throughout the                            procedure, the patient's blood pressure, pulse, and                            oxygen saturations were monitored continuously. The                            Model CF-HQ190L [id number]) scope was introduced                            through the anus and advanced to the the cecum,                            identified by  appendiceal orifice and ileocecal                            valve. The colonoscopy was performed without                            difficulty. The patient tolerated the procedure                            well. The quality of the bowel preparation was                            excellent. The ileocecal valve, appendiceal                            orifice, and rectum were photographed. Scope In: 9:59:50 AM Scope Out: 10:12:12 AM Scope Withdrawal Time: 0 hours 8 minutes 20 seconds  Total Procedure Duration: 0 hours 12 minutes 22 seconds  Findings:                 A 6 mm polyp was found in the ascending colon. The                            polyp was sessile. The polyp was removed with a                            cold snare. Resection and retrieval were complete.                           The exam was otherwise without abnormality on                            direct and retroflexion views. Complications:            No immediate complications. Estimated blood loss:                            None. Estimated Blood Loss:     Estimated blood loss: none. Impression:               -  One 6 mm polyp in the ascending colon, removed                            with a cold snare. Resected and retrieved.                           - The examination was otherwise normal on direct                            and retroflexion views. Recommendation:           - Patient has a contact number available for                            emergencies. The signs and symptoms of potential                            delayed complications were discussed with the                            patient. Return to normal activities tomorrow.                            Written discharge instructions were provided to the                            patient.                           - Resume previous diet.                           - Continue present medications.                           - Please start once daily fiber  supplement (OTC                            citrucel orange powder). Call in 5-6 weeks to                            report on your response. Hoping that if we can get                            your bowels moving easier, your left sided                            abdominal pain will be less frequent, severe.                           You will receive a letter within 2-3 weeks with the                            pathology results and my final recommendations.  If the polyp(s) is proven to be 'pre-cancerous' on                            pathology, you will need repeat colonoscopy in 5                            years. If the polyp(s) is NOT 'precancerous' on                            pathology then you should repeat colon cancer                            screening in 10 years with colonoscopy without need                            for colon cancer screening by any method prior to                            then (including stool testing). Milus Banister, MD 12/19/2015 10:16:14 AM This report has been signed electronically.

## 2015-12-19 NOTE — Patient Instructions (Addendum)
YOU HAD AN ENDOSCOPIC PROCEDURE TODAY AT [facility]:   Refer to the procedure report that was given to you for any specific questions about what was found during the examination.  If the procedure report does not answer your questions, please call your gastroenterologist to clarify.  If you requested that your care partner not be given the details of your procedure findings, then the procedure report has been included in a sealed envelope for you to review at your convenience later.  YOU SHOULD EXPECT: Some feelings of bloating in the abdomen. Passage of more gas than usual.  Walking can help get rid of the air that was put into your GI tract during the procedure and reduce the bloating. If you had a lower endoscopy (such as a colonoscopy or flexible sigmoidoscopy) you may notice spotting of blood in your stool or on the toilet paper. If you underwent a bowel prep for your procedure, you may not have a normal bowel movement for a few days.  Please Note:  You might notice some irritation and congestion in your nose or some drainage.  This is from the oxygen used during your procedure.  There is no need for concern and it should clear up in a day or so.  SYMPTOMS TO REPORT IMMEDIATELY:   Following lower endoscopy (colonoscopy or flexible sigmoidoscopy):  Excessive amounts of blood in the stool  Significant tenderness or worsening of abdominal pains  Swelling of the abdomen that is new, acute  Fever of 100F or higher For urgent or emergent issues, a gastroenterologist can be reached at any hour by calling [phone].   DIET:  We do recommend a small meal at first, but then you may proceed to your regular diet.  Drink plenty of fluids but you should avoid alcoholic beverages for 24 hours.  ACTIVITY:  You should plan to take it easy for the rest of today and you should NOT DRIVE or use heavy machinery until tomorrow (because of the sedation medicines used during the test).     FOLLOW UP: Our staff will call the number listed on your records the next business day following your procedure to check on you and address any questions or concerns that you may have regarding the information given to you following your procedure. If we do not reach you, we will leave a message.  However, if you are feeling well and you are not experiencing any problems, there is no need to return our call.  We will assume that you have returned to your regular daily activities without incident.  If any biopsies were taken you will be contacted by phone or by letter within the next 1-3 weeks.  Please call us at [phone] if you have not heard about the biopsies in 3 weeks.    SIGNATURES/CONFIDENTIALITY: You and/or your care partner have signed paperwork which will be entered into your electronic medical record.  These signatures attest to the fact that that the information above on your After Visit Summary has been reviewed and is understood.  Full responsibility of the confidentiality of this discharge information lies with you and/or your care-partner.  Polyp-handout given  Repeat colonoscopy will be determined by pathology.  Start Citrucel orange powder daily. Call in 5-6 weeks to report if your response to Citrucel.

## 2015-12-19 NOTE — Progress Notes (Signed)
Called to room to assist during endoscopic procedure.  Patient ID and intended procedure confirmed with present staff. Received instructions for my participation in the procedure from the performing physician.  

## 2015-12-19 NOTE — Progress Notes (Signed)
To recovery, report to Mirts, RN, VSS. 

## 2015-12-22 ENCOUNTER — Telehealth: Payer: Self-pay

## 2015-12-22 NOTE — Telephone Encounter (Signed)
No answer. Name identifier left a voicemail will attempt to call later today.

## 2015-12-22 NOTE — Telephone Encounter (Signed)
  Follow up Call-  Call back number 12/19/2015  Post procedure Call Back phone  # [phone]  Permission to leave phone message Yes    Patient was called for follow up after her procedure on 12/19/2015. No answer at the number given for follow up phone call. A message was left on the answering machine.

## 2015-12-29 ENCOUNTER — Encounter: Payer: Self-pay | Admitting: Gastroenterology

## 2016-01-26 ENCOUNTER — Other Ambulatory Visit: Payer: Commercial Managed Care - HMO | Admitting: Internal Medicine

## 2016-01-26 DIAGNOSIS — Z13220 Encounter for screening for lipoid disorders: Secondary | ICD-10-CM

## 2016-01-26 DIAGNOSIS — Z0000 Encounter for general adult medical examination without abnormal findings: Secondary | ICD-10-CM

## 2016-01-26 DIAGNOSIS — E039 Hypothyroidism, unspecified: Secondary | ICD-10-CM

## 2016-01-26 LAB — LIPID PANEL
CHOLESTEROL: 238 mg/dL — AB (ref <200)
HDL: 82 mg/dL (ref >50)
LDL Cholesterol: 133 mg/dL — ABNORMAL HIGH (ref <100)
Total CHOL/HDL Ratio: 2.9 Ratio (ref <5.0)
Triglycerides: 116 mg/dL (ref <150)
VLDL: 23 mg/dL (ref <30)

## 2016-01-26 LAB — TSH: TSH: 2.31 mIU/L

## 2016-01-27 LAB — VITAMIN D 25 HYDROXY (VIT D DEFICIENCY, FRACTURES): VIT D 25 HYDROXY: 50 ng/mL (ref 30–100)

## 2016-01-30 ENCOUNTER — Ambulatory Visit (INDEPENDENT_AMBULATORY_CARE_PROVIDER_SITE_OTHER): Payer: Commercial Managed Care - HMO | Admitting: Internal Medicine

## 2016-01-30 ENCOUNTER — Other Ambulatory Visit (HOSPITAL_COMMUNITY)
Admission: RE | Admit: 2016-01-30 | Discharge: 2016-01-30 | Disposition: A | Discharge status: Home / Self Care | Payer: Commercial Managed Care - HMO | Source: Ambulatory Visit | Attending: Internal Medicine | Admitting: Internal Medicine

## 2016-01-30 ENCOUNTER — Encounter: Payer: Self-pay | Admitting: Internal Medicine

## 2016-01-30 VITALS — BP 122/88 | HR 92 | Temp 98.7°F | Ht 63.0 in | Wt 184.0 lb

## 2016-01-30 DIAGNOSIS — I10 Essential (primary) hypertension: Secondary | ICD-10-CM | POA: Diagnosis not present

## 2016-01-30 DIAGNOSIS — Z0000 Encounter for general adult medical examination without abnormal findings: Secondary | ICD-10-CM

## 2016-01-30 DIAGNOSIS — E039 Hypothyroidism, unspecified: Secondary | ICD-10-CM | POA: Diagnosis not present

## 2016-01-30 DIAGNOSIS — F32A Depression, unspecified: Secondary | ICD-10-CM

## 2016-01-30 DIAGNOSIS — Z01419 Encounter for gynecological examination (general) (routine) without abnormal findings: Secondary | ICD-10-CM | POA: Diagnosis not present

## 2016-01-30 DIAGNOSIS — Z8669 Personal history of other diseases of the nervous system and sense organs: Secondary | ICD-10-CM

## 2016-01-30 DIAGNOSIS — Z01411 Encounter for gynecological examination (general) (routine) with abnormal findings: Secondary | ICD-10-CM | POA: Insufficient documentation

## 2016-01-30 DIAGNOSIS — J069 Acute upper respiratory infection, unspecified: Secondary | ICD-10-CM

## 2016-01-30 DIAGNOSIS — F418 Other specified anxiety disorders: Secondary | ICD-10-CM

## 2016-01-30 DIAGNOSIS — F419 Anxiety disorder, unspecified: Secondary | ICD-10-CM

## 2016-01-30 DIAGNOSIS — F329 Major depressive disorder, single episode, unspecified: Secondary | ICD-10-CM

## 2016-01-30 LAB — POCT URINALYSIS DIPSTICK
Bilirubin, UA: NEGATIVE
GLUCOSE UA: NEGATIVE
Ketones, UA: NEGATIVE
Leukocytes, UA: NEGATIVE
Nitrite, UA: NEGATIVE
Protein, UA: NEGATIVE
RBC UA: NEGATIVE
Spec Grav, UA: <=1.005
UROBILINOGEN UA: NEGATIVE
pH, UA: 6.5

## 2016-01-30 MED ORDER — TRIAMTERENE-HCTZ 75-50 MG PO TABS: 1.0000 | ORAL_TABLET | Freq: Every day | ORAL | 1 refills | Status: DC

## 2016-01-30 MED ORDER — HYDROCODONE-HOMATROPINE 5-1.5 MG/5ML PO SYRP: 5.0000 mL | ORAL_SOLUTION | Freq: Three times a day (TID) | ORAL | 0 refills | Status: DC | PRN

## 2016-01-30 MED ORDER — AZITHROMYCIN 250 MG PO TABS: ORAL_TABLET | ORAL | 0 refills | Status: DC

## 2016-01-30 MED ORDER — FLUCONAZOLE 150 MG PO TABS: 150.0000 mg | ORAL_TABLET | Freq: Once | ORAL | 1 refills | Status: AC

## 2016-01-30 MED ORDER — LEVOTHYROXINE SODIUM 112 MCG PO TABS: 112.0000 ug | ORAL_TABLET | Freq: Every day | ORAL | 3 refills | Status: DC

## 2016-01-30 NOTE — Progress Notes (Signed)
poc

## 2016-01-30 NOTE — Progress Notes (Signed)
Subjective:    Patient ID: Rebecca Mccoy, female    DOB: May 29, 1965, 51 y.o.   MRN: [id number]  HPI  51 year old Female for health maintenance exam and evaluation of medical issues.  C/o thinning hair, weight gain, and recent URI symptoms.  Has elevated cholesterol 238 and was 212. LDL has increased from 111 to 133.  Does take biotin.TSH is normal. CBC done in November was normal. C met was normal in November.  Has had Mirena IUD for 5 years. Says it needs to be removed next year. She will need to see GYN physician for that.  Order placed for mammogram.  She has a history of migraine headaches. Doesn't think Elavil is helping. She may want to stop it.  She had colonoscopy December 2017. 6 mm polyp found and ascending colon which was sessile. Pathology showed serrated adenoma.  History of recurrent MRSA infections. Husband has had similar problem.  History of mild anxiety treated with Xanax.  Past medical history: History of allergic rhinitis, GE reflux, status post tubal ligation. History of menorrhagia due to uterine fibroids 2006. History of bilateral carpal tunnel syndrome status post surgical release. History of hypertension.  Social history: She is married to Rebecca Mccoy who is also a patient here. She formerly lived in the [place] area between 2008 in 2015 as her previous husband at the time was in the [employer] and was deployed quite often. She divorced her husband who was in the [employer] in 2011. Ex-husband is a Camera operator in [employer]. Adult daughter living in [place] is moving back to the [place] to be for Abraxane. Patient has one granddaughter. She works for the [employer] and travels frequently. Reports 2 pregnancies and no miscarriages. Does not smoke. Social alcohol consumption maybe 1 or 2 drinks a month. Current husband is employed by city of [place]. She completed 2 years of college.  Tetanus immunization 2011.  History of bacterial vaginosis and  Candida vaginitis.  History of right knee pain. X-ray done 2014 was negative.  History of cellulitis right thigh July 2014. History of chondromalacia of the knee 2014.  Had negative exercise tolerance test in 2002 for chest pain. Has had sclerotherapy for varicose veins in 2004.  Family history: Mother with history of uterine cancer, hypertension and diabetes. Father died with pancreatic cancer diagnosed at age 10 with history of hypertension and MI.  Right distal phalanx second toe fracture October 15.  Had carbuncle on her chin December 2015 treated at an urgent care with clindamycin for 10 days.  She had an IUD inserted in 2008. History of sebaceous cyst on her back 2008. History of syncope 2008 referred for echocardiogram and an event monitor. No subsequent issues with syncope. Tonsillectomy 1972. Bilateral tubal ligation 1979. Abdominoplasty 2007. Breast augmentation 2010. Right carpal tunnel release February 2010. Left carpal tunnel release March 2011. Mirena IUD placed April 2013. History of anxiety disorder seen at [facility] in [place]. History of palpitations.  No known drug allergies        Review of Systems see above     Objective:   Physical Exam  Constitutional: She is oriented to person, place, and time. She appears well-developed and well-nourished. No distress.  HENT:  Head: Normocephalic and atraumatic.  Right Ear: External ear normal.  Mouth/Throat: Oropharynx is clear and moist.  Eyes: Conjunctivae and EOM are normal. Pupils are equal, round, and reactive to light. Right eye exhibits no discharge. Left  eye exhibits no discharge.  Neck: No JVD present.  Cardiovascular: Normal rate, regular rhythm and normal heart sounds.   No murmur heard. Pulmonary/Chest: She has no wheezes. She has no rales.  Breasts normal female  Abdominal: Soft. Bowel sounds are normal. She exhibits no distension and no mass. There is no tenderness.  There is no rebound and no guarding.  Genitourinary:  Genitourinary Comments: Pap taken. Bimanual normal.  Lymphadenopathy:    She has no cervical adenopathy.  Neurological: She is alert and oriented to person, place, and time. She has normal reflexes. No cranial nerve deficit.  Skin: Skin is warm and dry. No rash noted. She is not diaphoretic.  Psychiatric: She has a normal mood and affect. Her behavior is normal. Thought content normal.  Vitals reviewed.         Assessment & Plan:  Hair loss-etiology unclear. Recommend multivitamin plus the biotin supplement. Could be stress related or changes surrounding menopause. She may need to see dermatologist regarding this.  Weight gain-encouraged diet exercise and weight loss efforts  Acute URI  History of migraine headaches-she will stop Elavil and monitor headache activity. We'll continue low-dose Topamax and Zoloft.  Anxiety-treated sparingly with Xanax  History of MRSA infection  Hypothyroidism-continue thyroid replacement at same dose  Acute URI-treat with Hycodan and Zithromax  Abdominal pain with negative colonoscopy. Symptoms have improved slowly. Likely due to constipation or irritable bowel symptoms. Pain was mainly in her left lower quadrant.  Hyperlipidemia-watch diet and can recheck in 6 months  Plan: Return in 6 months for follow-up or sooner if necessary.

## 2016-02-04 LAB — CYTOLOGY - PAP: DIAGNOSIS: NEGATIVE

## 2016-02-10 ENCOUNTER — Other Ambulatory Visit: Payer: Self-pay | Admitting: Internal Medicine

## 2016-02-18 NOTE — Patient Instructions (Signed)
Take Zithromax Z-Pak and Hycodan as directed for respiratory infection. Discontinue Elavil. Continue same medications and return in 6 months.

## 2016-03-11 ENCOUNTER — Other Ambulatory Visit: Payer: Self-pay | Admitting: Internal Medicine

## 2016-03-11 DIAGNOSIS — Z1231 Encounter for screening mammogram for malignant neoplasm of breast: Secondary | ICD-10-CM

## 2016-03-13 ENCOUNTER — Encounter (HOSPITAL_COMMUNITY): Payer: Self-pay | Admitting: Family Medicine

## 2016-03-13 ENCOUNTER — Ambulatory Visit (INDEPENDENT_AMBULATORY_CARE_PROVIDER_SITE_OTHER): Payer: Commercial Managed Care - HMO

## 2016-03-13 ENCOUNTER — Ambulatory Visit (HOSPITAL_COMMUNITY)
Admission: EM | Admit: 2016-03-13 | Discharge: 2016-03-13 | Disposition: A | Discharge status: Home / Self Care | Payer: Commercial Managed Care - HMO | Attending: Family Medicine | Admitting: Family Medicine

## 2016-03-13 DIAGNOSIS — N200 Calculus of kidney: Secondary | ICD-10-CM | POA: Diagnosis not present

## 2016-03-13 DIAGNOSIS — R1012 Left upper quadrant pain: Secondary | ICD-10-CM

## 2016-03-13 LAB — POCT URINALYSIS DIP (DEVICE)
Bilirubin Urine: NEGATIVE
GLUCOSE, UA: NEGATIVE mg/dL
Hgb urine dipstick: NEGATIVE
LEUKOCYTES UA: NEGATIVE
Nitrite: NEGATIVE
PROTEIN: NEGATIVE mg/dL
Specific Gravity, Urine: 1.015 (ref 1.005–1.030)
UROBILINOGEN UA: 0.2 mg/dL (ref 0.0–1.0)
pH: 7 (ref 5.0–8.0)

## 2016-03-13 LAB — POCT PREGNANCY, URINE: PREG TEST UR: NEGATIVE

## 2016-03-13 MED ORDER — HYOSCYAMINE SULFATE SL 0.125 MG SL SUBL: 1.0000 | SUBLINGUAL_TABLET | Freq: Three times a day (TID) | SUBLINGUAL | 0 refills | Status: DC | PRN

## 2016-03-13 NOTE — ED Provider Notes (Signed)
[facility]    CSN: [id number] Arrival date & time: 03/13/16  1500     History   Chief Complaint Chief Complaint  Patient presents with  . Flank Pain    HPI Rebecca Mccoy is a 51 y.o. female.   This 51 year old woman who comes in with left sided abdominal pain. She's had similar pain twice this week that lasted now her. Today's pain began at 10:30 and is just subsiding now at 3:30. The pain today was more intense and was associated with one episode of vomiting.  Patient has a history of kidney stone. She was worked up for diverticulitis in the past when she had similar pain and her CAT scan and colonoscopy were negative.      Past Medical History:  Diagnosis Date  . Fluid retention   . GERD (gastroesophageal reflux disease)   . Hypertension   . Thyroid disease     Patient Active Problem List   Diagnosis Date Noted  . Hypothyroidism 12/03/2015  . Carbuncle 02/17/2014  . Anxiety and depression 02/17/2014  . History of migraine headaches 02/17/2014    Past Surgical History:  Procedure Laterality Date  . BREAST SURGERY  2005   Augmentation  . CARPAL TUNNEL RELEASE Bilateral    R in '07 and L '06  . leg lift    . TONSILLECTOMY  1972  . TUBAL LIGATION  1989  . tummy tuck  2005    OB History    No data available       Home Medications    Prior to Admission medications   Medication Sig Start Date End Date Taking? Authorizing Provider  azelastine (OPTIVAR) 0.05 % ophthalmic solution  09/17/15   Historical Provider, MD  Biotin 5000 MCG CAPS Take 1 capsule by mouth daily.    Historical Provider, MD  glucosamine-chondroitin 500-400 MG tablet Take 1 tablet by mouth daily.    Historical Provider, MD  Hyoscyamine Sulfate SL (LEVSIN/SL) 0.125 MG SUBL Place 1 tablet under the tongue every 8 (eight) hours as needed. 03/13/16   Robyn Haber, MD  levothyroxine (SYNTHROID, LEVOTHROID) 112 MCG tablet Take 1 tablet (112 mcg total) by mouth daily. 01/30/16    Elby Showers, MD  PANAX GINSENG PO Take 600 mg by mouth daily.    Historical Provider, MD  Prenatal Vit-Fe Fumarate-FA (MULTIVITAMIN-PRENATAL) 27-0.8 MG TABS tablet Take 1 tablet by mouth daily at 12 noon. OTC prenatal    Historical Provider, MD  sertraline (ZOLOFT) 100 MG tablet Take 1 tablet (100 mg total) by mouth daily. 09/26/15   Elby Showers, MD  topiramate (TOPAMAX) 25 MG tablet 2 po daily for prevention of migraine headaches 06/25/15   Elby Showers, MD  triamterene-hydrochlorothiazide (MAXZIDE) 75-50 MG tablet Take 1 tablet by mouth daily. 01/30/16   Elby Showers, MD    Family History Family History  Problem Relation Age of Onset  . Diabetes Mother   . Uterine cancer Mother   . Colon polyps Mother   . Pancreatic cancer Father   . Crohn's disease Cousin   . Colon cancer Neg Hx   . Stomach cancer Neg Hx   . Esophageal cancer Neg Hx   . Rectal cancer Neg Hx   . Liver cancer Neg Hx     Social History Social History  Substance Use Topics  . Smoking status: Never Smoker  . Smokeless tobacco: Never Used  . Alcohol use 0.0 oz/week     Comment: occasional  Allergies   Patient has no known allergies.   Review of Systems Review of Systems  Constitutional: Negative.   HENT: Negative.   Respiratory: Negative.   Cardiovascular: Negative.   Gastrointestinal: Positive for abdominal pain and vomiting. Negative for diarrhea.  Musculoskeletal: Negative.   Neurological: Negative.   Psychiatric/Behavioral: Negative.      Physical Exam Triage Vital Signs ED Triage Vitals  Enc Vitals Group     BP 03/13/16 1529 122/87     Pulse Rate 03/13/16 1529 85     Resp 03/13/16 1529 16     Temp 03/13/16 1529 98.5 F (36.9 C)     Temp Source 03/13/16 1529 Oral     SpO2 03/13/16 1529 98 %     Weight 03/13/16 1529 175 lb (79.4 kg)     Height 03/13/16 1529 5\' 3"  (1.6 m)     Head Circumference --      Peak Flow --      Pain Score 03/13/16 1530 4     Pain Loc --      Pain Edu? --       Excl. in [place]? --    No data found.   Updated Vital Signs BP 122/87   Pulse 85   Temp 98.5 F (36.9 C) (Oral)   Resp 16   Ht 5\' 3"  (1.6 m)   Wt 175 lb (79.4 kg)   SpO2 98%   BMI 31.00 kg/m    Physical Exam  Constitutional: She is oriented to person, place, and time. She appears well-developed and well-nourished.  HENT:  Head: Normocephalic.  Right Ear: External ear normal.  Left Ear: External ear normal.  Mouth/Throat: Oropharynx is clear and moist.  Eyes: Conjunctivae and EOM are normal. Pupils are equal, round, and reactive to light.  Neck: Normal range of motion. Neck supple.  Pulmonary/Chest: Effort normal.  Abdominal: Soft. She exhibits no mass. There is tenderness. There is no rebound and no guarding.  Tender left upper quadrant with deep palpation. No flank tenderness.  Musculoskeletal: Normal range of motion.  Neurological: She is alert and oriented to person, place, and time.  Skin: Skin is warm and dry.  Nursing note and vitals reviewed.    UC Treatments / Results  Labs (all labs ordered are listed, but only abnormal results are displayed) Labs Reviewed  POCT URINALYSIS DIP (DEVICE) - Abnormal; Notable for the following:       Result Value   Ketones, ur TRACE (*)    All other components within normal limits  POCT PREGNANCY, URINE    EKG  EKG Interpretation None       Radiology Left upper quadrant gas with mildly dilated colon Procedures Procedures (including critical care time)  Medications Ordered in UC Medications - No data to display   Initial Impression / Assessment and Plan / UC Course  I have reviewed the triage vital signs and the nursing notes.  Pertinent labs & imaging results that were available during my care of the patient were reviewed by me and considered in my medical decision making (see chart for details).     Final Clinical Impressions(s) / UC Diagnoses   Final diagnoses:  LUQ abdominal pain    New  Prescriptions New Prescriptions   HYOSCYAMINE SULFATE SL (LEVSIN/SL) 0.125 MG SUBL    Place 1 tablet under the tongue every 8 (eight) hours as needed.     Robyn Haber, MD 03/13/16 [phone]

## 2016-03-13 NOTE — ED Triage Notes (Signed)
PT reports left side pain with radiation to left and center back. PT had a CT scan a few months ago that showed a kidney stone in left kidney. PT reports pain intermittent and she is nauseated when severe pain occurs

## 2016-03-25 ENCOUNTER — Ambulatory Visit
Admission: RE | Admit: 2016-03-25 | Discharge: 2016-03-25 | Disposition: A | Discharge status: Home / Self Care | Payer: Commercial Managed Care - HMO | Source: Ambulatory Visit | Attending: Internal Medicine | Admitting: Internal Medicine

## 2016-03-25 DIAGNOSIS — Z1231 Encounter for screening mammogram for malignant neoplasm of breast: Secondary | ICD-10-CM

## 2016-05-20 ENCOUNTER — Ambulatory Visit (INDEPENDENT_AMBULATORY_CARE_PROVIDER_SITE_OTHER): Payer: Commercial Managed Care - HMO | Admitting: Internal Medicine

## 2016-05-20 ENCOUNTER — Encounter: Payer: Self-pay | Admitting: Internal Medicine

## 2016-05-20 VITALS — BP 122/62 | HR 77 | Temp 97.9°F | Wt 176.0 lb

## 2016-05-20 DIAGNOSIS — L237 Allergic contact dermatitis due to plants, except food: Secondary | ICD-10-CM | POA: Diagnosis not present

## 2016-05-20 DIAGNOSIS — Z8669 Personal history of other diseases of the nervous system and sense organs: Secondary | ICD-10-CM

## 2016-05-20 DIAGNOSIS — I10 Essential (primary) hypertension: Secondary | ICD-10-CM | POA: Diagnosis not present

## 2016-05-20 DIAGNOSIS — G4709 Other insomnia: Secondary | ICD-10-CM

## 2016-05-20 MED ORDER — ALPRAZOLAM 0.5 MG PO TABS: 0.5000 mg | ORAL_TABLET | Freq: Every evening | ORAL | 5 refills | Status: DC | PRN

## 2016-05-20 MED ORDER — HYOSCYAMINE SULFATE SL 0.125 MG SL SUBL: 1.0000 | SUBLINGUAL_TABLET | Freq: Three times a day (TID) | SUBLINGUAL | 0 refills | Status: DC | PRN

## 2016-05-20 MED ORDER — PREDNISONE 10 MG PO TABS: ORAL_TABLET | ORAL | 0 refills | Status: DC

## 2016-05-20 NOTE — Patient Instructions (Addendum)
Take Prednisone as directed. Xanax refilled. Discussion regarding insomnia.

## 2016-05-20 NOTE — Progress Notes (Signed)
   Subjective:    Patient ID: CHIQUITA HECKERT, female    DOB: 23-Jul-1965, 51 y.o.   MRN: [id number]  HPI 51 year old Female with longstanding history of insomnia. Taking Xanax 0.25 mg  at bedtime but Still with some issues sleeping. Took Elavil for migraine headache control but in January did not think it was helping and  suggested she stop it. She now realizes that was helping her sleep. Discussion regarding sleep and sleep habits. Have refilled Xanax at her request. Tends to do work late at night and cannot turn it off when she goes to sleep.  She has come into contact with some poison ivy recently helping her husband yard work.    Review of Systems see above     Objective:   Physical Exam She has some papular lesions on her hands that is consistent with contact dermatitis/poison ivy. She is alert and oriented. Affect is appropriate.       Assessment & Plan:  Contact dermatitis-poison ivy  History of headaches  Insomnia-long-standing and likely related to anxiety with work stress  History of anxiety  Plan: Sterapred DS 10 mg 6 day dosepak to take in tapering course as directed starting with 60 mg and decreasing by 10 mg daily. Xanax refilled.  Spent 25 minutes discussing these issues with patient

## 2016-06-02 ENCOUNTER — Encounter: Payer: Self-pay | Admitting: Gynecology

## 2016-06-29 ENCOUNTER — Telehealth: Payer: Self-pay | Admitting: Internal Medicine

## 2016-06-29 DIAGNOSIS — R109 Unspecified abdominal pain: Secondary | ICD-10-CM

## 2016-06-29 DIAGNOSIS — R197 Diarrhea, unspecified: Secondary | ICD-10-CM

## 2016-06-29 DIAGNOSIS — H109 Unspecified conjunctivitis: Secondary | ICD-10-CM

## 2016-06-29 MED ORDER — CILIDINIUM-CHLORDIAZEPOXIDE 2.5-5 MG PO CAPS: 1.0000 | ORAL_CAPSULE | Freq: Two times a day (BID) | ORAL | 0 refills | Status: DC

## 2016-06-29 MED ORDER — OFLOXACIN 0.3 % OP SOLN: 2.0000 [drp] | Freq: Four times a day (QID) | OPHTHALMIC | 0 refills | Status: DC

## 2016-06-29 NOTE — Telephone Encounter (Signed)
Patient wants to come in and talk about pain and loose bowels and she thinks she has the pink eye.

## 2016-06-29 NOTE — Telephone Encounter (Addendum)
Side pain is the same as what GI seen her for. States that the pain is reminding her of a kidney stone and seen UC recently states that they told her that her right of her colon was constricted. A medication was sent in for that and refilled once here. Pain has increased today and with that is increasing her bowel movements. No blood in stool. Has not had a solid stool in the last 2 months. Still has mirena in that was supposed to be removed in May so not sure if that has something to do with it. States she also has pink eye, started 3 days ago with the pink eyes crusty eye lids and itching some but mainly burning feeling,  and would like to know if an eye drop called in for her. Pt called back and Dr. Renold Genta took the call  Pt says she is having several loose stools 3-4 daily not responding to Levsin. Has been going on for some time. Not related to milk or milk products. Has had unremarkable colonoscopy by Dr. Ardis Hughs. Also C/o conjunctivitis symptoms. Does not have travel history no blood in bowel movement. Asking about gallbladder disease. Has many questions.No hematuria or UTi symptoms. Does not feel she needs to go to ED.  We will call in Librax bid and ocuflox opthalmic drops. Needs to go back to GI- sounds frustrated.

## 2016-06-29 NOTE — Telephone Encounter (Deleted)
Please call her. She has seen GI before.

## 2016-07-07 ENCOUNTER — Ambulatory Visit: Payer: Commercial Managed Care - HMO | Admitting: Physician Assistant

## 2016-07-09 ENCOUNTER — Other Ambulatory Visit: Payer: 59

## 2016-07-09 ENCOUNTER — Ambulatory Visit (INDEPENDENT_AMBULATORY_CARE_PROVIDER_SITE_OTHER): Payer: 59 | Admitting: Physician Assistant

## 2016-07-09 ENCOUNTER — Ambulatory Visit: Payer: Commercial Managed Care - HMO | Admitting: Physician Assistant

## 2016-07-09 ENCOUNTER — Encounter: Payer: Self-pay | Admitting: Physician Assistant

## 2016-07-09 VITALS — BP 120/80 | HR 73 | Ht 63.0 in | Wt 176.0 lb

## 2016-07-09 DIAGNOSIS — R109 Unspecified abdominal pain: Secondary | ICD-10-CM

## 2016-07-09 DIAGNOSIS — Z860101 Personal history of adenomatous and serrated colon polyps: Secondary | ICD-10-CM

## 2016-07-09 DIAGNOSIS — R197 Diarrhea, unspecified: Secondary | ICD-10-CM

## 2016-07-09 DIAGNOSIS — Z86010 Personal history of colonic polyps: Secondary | ICD-10-CM | POA: Insufficient documentation

## 2016-07-09 DIAGNOSIS — R1030 Lower abdominal pain, unspecified: Secondary | ICD-10-CM | POA: Diagnosis not present

## 2016-07-09 DIAGNOSIS — K580 Irritable bowel syndrome with diarrhea: Secondary | ICD-10-CM

## 2016-07-09 HISTORY — DX: Personal history of colonic polyps: Z86.010

## 2016-07-09 HISTORY — DX: Personal history of adenomatous and serrated colon polyps: Z86.0101

## 2016-07-09 MED ORDER — GLYCOPYRROLATE 2 MG PO TABS: 2.0000 mg | ORAL_TABLET | Freq: Two times a day (BID) | ORAL | 3 refills | Status: DC

## 2016-07-09 NOTE — Progress Notes (Signed)
Excellent evaluation

## 2016-07-09 NOTE — Progress Notes (Signed)
I agree with the above note, plan 

## 2016-07-09 NOTE — Progress Notes (Signed)
Subjective:    Patient ID: Rebecca Mccoy, female    DOB: 03-10-1965, 51 y.o.   MRN: [id number]  HPI Rebecca Mccoy is a pleasant 51 year old white female, known to Dr. Ardis Hughs and myself. She was initially seen in our office in November 2017 with left lower quadrant pain. She underwent colonoscopy in December 2017 with finding of a 6 mm polyp in the ascending colon and an otherwise normal exam. Biopsy showed a sessile serrated polyp no dysplasia and she is indicated for 5 year interval follow-up. She had also had CT of the abdomen and pelvis in November 2017 which showed a few scattered small diverticuli, and IUD, and a 1.6 x 1.6 cm stone in the upper pole of the left kidney, no hydronephrosis. Patient comes in today with about a 3 month history of change in bowel habits. She says her bowels had been fairly normal and she started having much looser stools and 2-3 bowel movements per day. He has also had some intermittent episodes of left-sided abdominal pain which may be present for several hours to an entire day. She says with these episodes she will generally have more urgent diarrheal stools and up to 4-5 bowel movements per day. She's not noted any melena or hematochezia. No fever or chills. Appetite has been fine and weight has been stable. She does feel that the pain episodes are aggravated by stress and sometimes fried foods. She had been given a prescription for Librax which she says is helpful but completely knocks her out, she also has a prescription for Levsin sublingual and has used that off and on with some success. Patient has not had any recent antibiotics, no new medications. She says the only change was coming  off of Elavil about the same time that her symptoms started.  Review of Systems Pertinent positive and negative review of systems were noted in the above HPI section.  All other review of systems was otherwise negative.  Outpatient Encounter Prescriptions as of 07/09/2016  Medication Sig    . ALPRAZolam (XANAX) 0.5 MG tablet Take 1 tablet (0.5 mg total) by mouth at bedtime as needed for anxiety.  Marland Kitchen azelastine (OPTIVAR) 0.05 % ophthalmic solution   . Biotin 5000 MCG CAPS Take 1 capsule by mouth daily.  . clidinium-chlordiazePOXIDE (LIBRAX) 5-2.5 MG capsule Take 1 capsule by mouth 2 (two) times daily before a meal. (Patient taking differently: Take 1 capsule by mouth daily. )  . glucosamine-chondroitin 500-400 MG tablet Take 1 tablet by mouth daily.  Marland Kitchen Hyoscyamine Sulfate SL (LEVSIN/SL) 0.125 MG SUBL Place 1 tablet under the tongue every 8 (eight) hours as needed.  Marland Kitchen levothyroxine (SYNTHROID, LEVOTHROID) 112 MCG tablet Take 1 tablet (112 mcg total) by mouth daily.  Marland Kitchen PANAX GINSENG PO Take 600 mg by mouth daily.  . Prenatal Vit-Fe Fumarate-FA (MULTIVITAMIN-PRENATAL) 27-0.8 MG TABS tablet Take 1 tablet by mouth daily at 12 noon. OTC prenatal  . sertraline (ZOLOFT) 100 MG tablet Take 1 tablet (100 mg total) by mouth daily.  Marland Kitchen triamterene-hydrochlorothiazide (MAXZIDE) 75-50 MG tablet Take 1 tablet by mouth daily.  Marland Kitchen glycopyrrolate (ROBINUL) 2 MG tablet Take 1 tablet (2 mg total) by mouth 2 (two) times daily.  . [DISCONTINUED] ofloxacin (OCUFLOX) 0.3 % ophthalmic solution Place 2 drops into both eyes 4 (four) times daily.  . [DISCONTINUED] predniSONE (DELTASONE) 10 MG tablet Take as directed in tapering course as directed 6-5-4-3-2-1 .  Marland Kitchen [DISCONTINUED] topiramate (TOPAMAX) 25 MG tablet 2 po daily for prevention of  migraine headaches (Patient not taking: Reported on 05/20/2016)   Facility-Administered Encounter Medications as of 07/09/2016  Medication  . 0.9 %  sodium chloride infusion   No Known Allergies Patient Active Problem List   Diagnosis Date Noted  . Hypothyroidism 12/03/2015  . Carbuncle 02/17/2014  . Anxiety and depression 02/17/2014  . History of migraine headaches 02/17/2014   Social History   Social History  . Marital status: Married    Spouse name: N/A  . Number  of children: 2  . Years of education: N/A   Occupational History  . Operations Support Specialist    Social History Main Topics  . Smoking status: Never Smoker  . Smokeless tobacco: Never Used  . Alcohol use 0.0 oz/week     Comment: occasional  . Drug use: No  . Sexual activity: Yes    Birth control/ protection: IUD, Surgical     Comment: INTERCOUSE AGE 54, SEXUAL PARTNERS MORE THAN 5   Other Topics Concern  . Not on file   Social History Narrative  . No narrative on file    Rebecca Mccoy's family history includes Colon polyps in her mother; Crohn's disease in her cousin; Diabetes in her mother; Pancreatic cancer in her father; Uterine cancer in her mother.      Objective:    Vitals:   07/09/16 1402  BP: 120/80  Pulse: 73    Physical Exam  well-developed white female in no acute distress, blood pressure 120/80 pulse 73, height 5 foot 3, weight 176, BMI 31.1. HEENT; nontraumatic, cephalic EOMI PERRLA sclera anicteric, Cardiovascular; regular rate and rhythm with S1-S2 no murmur or gallop, Pulmonary; clear bilaterally, Abdomen; soft, bowel sounds are present, no palpable mass or hepatosplenomegaly, she is status post abdominoplasty, she has some mild tenderness in the left mid quadrant no guarding or rebound, Rectal ;exam not done, Extremities; no clubbing cyanosis or edema skin warm and dry, Neuropsych; mood and affect appropriate         Assessment & Plan:   #51 51 year old female with 3 month history of change in bowel habits with looser more frequent stools and intermittent episodes of left-sided abdominal pain associated with more urgent diarrheal stools. Symptoms aggravated by stress and fried foods. I think her symptoms are consistent with IBS- D,, and pain most likely secondary to colonic spasm. She's had a relatively recent evaluation with colonoscopy and CT scan both of which were reassuring Interesting that symptoms started after she stopped Elavil- as Elavil has been  found to be helpful with IBS type pain, and also generally is associated with constipation.  #2 history of sessile serrated polyp-status post colonoscopy December 2017, will be due for follow-up December 2022 #3 anxiety Exline #4 hypothyroid #5 status post abdominoplasty #6 left upper pole renal calculus  Plan; CBC with differential, BMET, sedimentation rate, Check GI pathogen panel and stool for lactoferrin Start trial of align 1 by mouth daily over the next couple of months Start Robinul Forte 2 mg by mouth twice a day to be taken regularly, not when necessary Patient will follow-up with Dr. Ardis Hughs or myself as needed. If symptoms do not improve with the above regimen and/or worsen would consider repeat imaging.  Amy Genia Harold PA-C 07/09/2016   Cc: Elby Showers, MD

## 2016-07-09 NOTE — Patient Instructions (Signed)
We have sent the following medications to your pharmacy for you to pick up at your convenience:  Robinul Forte  Please start over the counter Align, one daily  Follow up with Amy or Dr. Ardis Hughs as needed

## 2016-07-12 ENCOUNTER — Other Ambulatory Visit: Payer: 59

## 2016-07-12 DIAGNOSIS — R1030 Lower abdominal pain, unspecified: Secondary | ICD-10-CM

## 2016-07-12 DIAGNOSIS — R109 Unspecified abdominal pain: Secondary | ICD-10-CM

## 2016-07-12 DIAGNOSIS — R197 Diarrhea, unspecified: Secondary | ICD-10-CM

## 2016-07-15 LAB — GASTROINTESTINAL PATHOGEN PANEL PCR
C. difficile Tox A/B, PCR: NOT DETECTED
Campylobacter, PCR: NOT DETECTED
Cryptosporidium, PCR: NOT DETECTED
E coli (ETEC) LT/ST PCR: NOT DETECTED
E coli (STEC) stx1/stx2, PCR: NOT DETECTED
E coli 0157, PCR: NOT DETECTED
Giardia lamblia, PCR: NOT DETECTED
NOROVIRUS, PCR: NOT DETECTED
Rotavirus A, PCR: NOT DETECTED
SALMONELLA, PCR: NOT DETECTED
SHIGELLA, PCR: NOT DETECTED

## 2016-07-23 ENCOUNTER — Other Ambulatory Visit: Payer: Commercial Managed Care - HMO | Admitting: Internal Medicine

## 2016-07-26 ENCOUNTER — Telehealth: Payer: Self-pay

## 2016-07-26 ENCOUNTER — Other Ambulatory Visit: Payer: Commercial Managed Care - HMO | Admitting: Internal Medicine

## 2016-07-26 DIAGNOSIS — E039 Hypothyroidism, unspecified: Secondary | ICD-10-CM

## 2016-07-26 DIAGNOSIS — E7800 Pure hypercholesterolemia, unspecified: Secondary | ICD-10-CM

## 2016-07-26 MED ORDER — TRIAMTERENE-HCTZ 75-50 MG PO TABS: 1.0000 | ORAL_TABLET | Freq: Every day | ORAL | 3 refills | Status: DC

## 2016-07-26 NOTE — Telephone Encounter (Signed)
Received fax from [facility] in regards to a refill on Triamterene-Hydrochlorothiazide 75-50mg  for patient. Medication was refilled per Dr. Verlene Mayer request. Sent in one year supply

## 2016-07-27 LAB — LIPID PANEL
Cholesterol: 236 mg/dL — ABNORMAL HIGH (ref <200)
HDL: 75 mg/dL (ref >50)
LDL CALC: 139 mg/dL — AB (ref <100)
TRIGLYCERIDES: 112 mg/dL (ref <150)
Total CHOL/HDL Ratio: 3.1 Ratio (ref <5.0)
VLDL: 22 mg/dL (ref <30)

## 2016-07-27 LAB — TSH: TSH: 3.71 m[IU]/L

## 2016-07-28 ENCOUNTER — Other Ambulatory Visit (INDEPENDENT_AMBULATORY_CARE_PROVIDER_SITE_OTHER): Payer: 59

## 2016-07-28 DIAGNOSIS — R109 Unspecified abdominal pain: Secondary | ICD-10-CM

## 2016-07-28 DIAGNOSIS — R1030 Lower abdominal pain, unspecified: Secondary | ICD-10-CM | POA: Diagnosis not present

## 2016-07-28 DIAGNOSIS — R197 Diarrhea, unspecified: Secondary | ICD-10-CM

## 2016-07-28 LAB — BASIC METABOLIC PANEL
BUN: 18 mg/dL (ref 6–23)
CHLORIDE: 97 meq/L (ref 96–112)
CO2: 31 meq/L (ref 19–32)
CREATININE: 0.92 mg/dL (ref 0.40–1.20)
Calcium: 10.1 mg/dL (ref 8.4–10.5)
GFR: 68.38 mL/min (ref >60.00)
Glucose, Bld: 110 mg/dL — ABNORMAL HIGH (ref 70–99)
POTASSIUM: 4.1 meq/L (ref 3.5–5.1)
Sodium: 137 mEq/L (ref 135–145)

## 2016-07-28 LAB — CBC WITH DIFFERENTIAL/PLATELET
BASOS ABS: 0 10*3/uL (ref 0.0–0.1)
Basophils Relative: 0.8 % (ref 0.0–3.0)
EOS ABS: 0.1 10*3/uL (ref 0.0–0.7)
Eosinophils Relative: 2.5 % (ref 0.0–5.0)
HEMATOCRIT: 39.3 % (ref 36.0–46.0)
Hemoglobin: 13.4 g/dL (ref 12.0–15.0)
LYMPHS ABS: 2.1 10*3/uL (ref 0.7–4.0)
LYMPHS PCT: 35.3 % (ref 12.0–46.0)
MCHC: 34.1 g/dL (ref 30.0–36.0)
MCV: 92.7 fl (ref 78.0–100.0)
MONOS PCT: 7.4 % (ref 3.0–12.0)
Monocytes Absolute: 0.4 10*3/uL (ref 0.1–1.0)
NEUTROS PCT: 54 % (ref 43.0–77.0)
Neutro Abs: 3.2 10*3/uL (ref 1.4–7.7)
Platelets: 268 10*3/uL (ref 150.0–400.0)
RBC: 4.24 Mil/uL (ref 3.87–5.11)
RDW: 14.1 % (ref 11.5–15.5)
WBC: 5.9 10*3/uL (ref 4.0–10.5)

## 2016-07-28 LAB — SEDIMENTATION RATE: SED RATE: 47 mm/h — AB (ref 0–30)

## 2016-07-30 ENCOUNTER — Ambulatory Visit (INDEPENDENT_AMBULATORY_CARE_PROVIDER_SITE_OTHER): Payer: Commercial Managed Care - HMO | Admitting: Internal Medicine

## 2016-07-30 ENCOUNTER — Encounter: Payer: Self-pay | Admitting: Internal Medicine

## 2016-07-30 VITALS — BP 108/76 | HR 75 | Temp 97.5°F | Wt 173.0 lb

## 2016-07-30 DIAGNOSIS — G4700 Insomnia, unspecified: Secondary | ICD-10-CM

## 2016-07-30 DIAGNOSIS — E7800 Pure hypercholesterolemia, unspecified: Secondary | ICD-10-CM | POA: Diagnosis not present

## 2016-07-30 DIAGNOSIS — R197 Diarrhea, unspecified: Secondary | ICD-10-CM

## 2016-07-30 DIAGNOSIS — R5383 Other fatigue: Secondary | ICD-10-CM | POA: Diagnosis not present

## 2016-07-30 DIAGNOSIS — Z8669 Personal history of other diseases of the nervous system and sense organs: Secondary | ICD-10-CM

## 2016-07-30 DIAGNOSIS — R609 Edema, unspecified: Secondary | ICD-10-CM | POA: Diagnosis not present

## 2016-07-30 DIAGNOSIS — E039 Hypothyroidism, unspecified: Secondary | ICD-10-CM | POA: Diagnosis not present

## 2016-07-30 DIAGNOSIS — Z6830 Body mass index (BMI) 30.0-30.9, adult: Secondary | ICD-10-CM

## 2016-07-30 HISTORY — DX: Insomnia, unspecified: G47.00

## 2016-07-30 MED ORDER — ROSUVASTATIN CALCIUM 5 MG PO TABS: 5.0000 mg | ORAL_TABLET | Freq: Every day | ORAL | 3 refills | Status: DC

## 2016-07-30 MED ORDER — LEVOTHYROXINE SODIUM 125 MCG PO TABS
125.0000 ug | ORAL_TABLET | Freq: Every day | ORAL | 1 refills | Status: DC
Start: 2016-07-30 — End: 2017-01-19

## 2016-07-30 MED ORDER — SERTRALINE HCL 100 MG PO TABS: 100.0000 mg | ORAL_TABLET | Freq: Every day | ORAL | 1 refills | Status: DC

## 2016-07-30 NOTE — Patient Instructions (Signed)
Start Crestor 5 mg daily. Increase thyroid replacement medication to 0.125 mg daily. Follow-up on these issues in 3 months. Check hemoglobin A1c for glucose intolerance. Continue Xanax for insomnia. Irritable bowel handout given.

## 2016-07-30 NOTE — Progress Notes (Signed)
   Subjective:    Patient ID: Rebecca Mccoy, female    DOB: 1965-12-23, 51 y.o.   MRN: [id number]  HPI  Pt in today for follow up of multiple issues.  She had normal colonoscopy in December. Still with diarrhea. Seeing Amy Esterwood. Sed rate 47.                ? Irritable Bowel syndrome. Given Dr. Buel Ream irritable bowel handout. Certain foods trigger diarrhea such as  [place] and red meat.  Lipid panel shows pure hyperlipidemia. Start Crestor 5 mg daily RTC 3 months.  Needs refill on Zoloft- done  Taking Xanax 0.5 mg hs for insomnia.Works until 12:30 at night and cannot turn her brain off to sleep. This is working well for her. We will continue for now-may also help with irritable bowel symptoms.  Lab work done July 11 at GI office showed  elevated serum glucose. Check hemoglobin A1c. This was drawn today. Only had protein bar and water when B-met done July 11.  TSH 3.71  with c/o of fatigue-increase levothyroxine to 0.125 mg in follow-up in 3 months  Migraines stable at this time.  Has allergic rhinitis symptoms with watery eyes. Has eye drops per eye doctor.  Fluid retention treated with diuretic.      Review of Systems see above     Objective:   Physical Exam No thyromegaly. Chest clear. Cardiac exam regular rate and rhythm normal S1 and S2. Extremities without edema       Assessment & Plan:  Probable irritable bowel syndrome with persistent intermittent diarrhea with negative workup thus far. Irritable bowel handout given.  Insomnia  Fatigue-change thyroid replacement dose and follow-up in 3 months. Check hemoglobin A1c due to elevated serum glucose. Some of the fatigue may be situational stress with job. May not be getting enough sleep if staying up to 12:30 AM every night during the week.  Allergic rhinitis with allergic conjunctivitis. Has eye drops per eye physician  Hypothyroidism-see above. Increase levothyroxine to 0.125 mg daily and follow-up in 3  months  Obesity-does not want to see dietitian. Says she eats healthy.  Hyperlipidemia-start Crestor 5 mg daily and follow-up with lipid panel and liver functions in 3 months  Plan: Follow-up in 3 months. Will need TSH at that time and perhaps hemoglobin A1c.

## 2016-07-31 LAB — HEMOGLOBIN A1C
HEMOGLOBIN A1C: 5.2 % (ref <5.7)
MEAN PLASMA GLUCOSE: 103 mg/dL

## 2016-10-05 ENCOUNTER — Ambulatory Visit: Payer: 59 | Admitting: Gastroenterology

## 2016-10-08 ENCOUNTER — Telehealth: Payer: Self-pay

## 2016-10-08 DIAGNOSIS — Z029 Encounter for administrative examinations, unspecified: Secondary | ICD-10-CM

## 2016-10-08 MED ORDER — FLUCONAZOLE 150 MG PO TABS
150.0000 mg | ORAL_TABLET | Freq: Once | ORAL | 1 refills | Status: AC
Start: 2016-10-08 — End: 2016-10-08

## 2016-10-08 MED ORDER — DOXYCYCLINE HYCLATE 100 MG PO TABS: 100.0000 mg | ORAL_TABLET | Freq: Two times a day (BID) | ORAL | 0 refills | Status: DC

## 2016-10-08 NOTE — Telephone Encounter (Signed)
We will make an exception but generally do not do this without OV.Please let her know this. Call in Doxycycline 100 mg #20 to take one po bid and Diflucan 150 mg tab with one refill. There will be a charge for call in Rxs.

## 2016-10-08 NOTE — Telephone Encounter (Signed)
Pt called and stated that a "MRSA bump" has popped up and she would like Doxycycline and Diflucan sent in for her. She said she can not come in for an appointment until Monday at the earliest and would prefer to be able to just have the medications sent in. Please advise

## 2016-10-08 NOTE — Telephone Encounter (Signed)
Done, and LVM for pt explaining this all to her

## 2016-10-20 ENCOUNTER — Telehealth: Payer: Self-pay | Admitting: *Deleted

## 2016-10-20 ENCOUNTER — Other Ambulatory Visit: Payer: Self-pay | Admitting: Women's Health

## 2016-10-20 DIAGNOSIS — N912 Amenorrhea, unspecified: Secondary | ICD-10-CM

## 2016-10-20 NOTE — Telephone Encounter (Signed)
Patient last seen in 2016 here. Pt had pap with Dr.Baxley (in epic) in 01/2016. Mirena IUD due for removal, would like to have new one placed as well. Patient asked if she can just schedule OV for removal/ insertion only with no prior appointment? Please advise

## 2016-10-20 NOTE — Telephone Encounter (Signed)
Phone call, amenorrheic with Mirena IUD placed 05/2011 at [facility]. Also has a BTL. No menopausal symptoms. Will check an [place] if low remove or replace Mirena IUD elevated we will just remove Mirena IUD. Will schedule lab appointment.

## 2016-10-21 ENCOUNTER — Other Ambulatory Visit: Payer: Commercial Managed Care - HMO

## 2016-10-21 DIAGNOSIS — N912 Amenorrhea, unspecified: Secondary | ICD-10-CM

## 2016-10-22 LAB — FOLLICLE STIMULATING HORMONE: FSH: 95.6 m[IU]/mL

## 2016-11-01 ENCOUNTER — Ambulatory Visit (INDEPENDENT_AMBULATORY_CARE_PROVIDER_SITE_OTHER): Payer: 59 | Admitting: Women's Health

## 2016-11-01 ENCOUNTER — Encounter: Payer: Self-pay | Admitting: Women's Health

## 2016-11-01 VITALS — BP 130/80

## 2016-11-01 DIAGNOSIS — Z30432 Encounter for removal of intrauterine contraceptive device: Secondary | ICD-10-CM

## 2016-11-01 NOTE — Progress Notes (Signed)
51 year old MWF G2 P2 Presents for IUD removal History of menorrhagia, second IUD, placed 2013 due to be removed.amenorrheic. Placed at [facility] .[place] [address] minimal hot flushes, having some issues with weight gain, decreased libido, some mood changes.  Annual exam with primary care with Paps being done there.   History of BTL.   Exam: Appears well, external genitalia within normal limits, speculum exam IUD strings visible, grasped with ring forcept, removed intact shown to patient and discarded.  Plan: instructed to call if any bleeding, menopausal symptoms that are bothersome. Mother history of endometrial cancer, survivor, symptoms discussed .

## 2016-11-01 NOTE — Patient Instructions (Signed)
Menopause Menopause is the normal time of life when menstrual periods stop completely. Menopause is complete when you have missed 12 consecutive menstrual periods. It usually occurs between the ages of 48 years and 55 years. Very rarely does a woman develop menopause before the age of 40 years. At menopause, your ovaries stop producing the female hormones estrogen and progesterone. This can cause undesirable symptoms and also affect your health. Sometimes the symptoms may occur 4-5 years before the menopause begins. There is no relationship between menopause and:  Oral contraceptives.  Number of children you had.  Race.  The age your menstrual periods started (menarche).  Heavy smokers and very thin women may develop menopause earlier in life. What are the causes?  The ovaries stop producing the female hormones estrogen and progesterone. Other causes include:  Surgery to remove both ovaries.  The ovaries stop functioning for no known reason.  Tumors of the pituitary gland in the brain.  Medical disease that affects the ovaries and hormone production.  Radiation treatment to the abdomen or pelvis.  Chemotherapy that affects the ovaries.  What are the signs or symptoms?  Hot flashes.  Night sweats.  Decrease in sex drive.  Vaginal dryness and thinning of the vagina causing painful intercourse.  Dryness of the skin and developing wrinkles.  Headaches.  Tiredness.  Irritability.  Memory problems.  Weight gain.  Bladder infections.  Hair growth of the face and chest.  Infertility. More serious symptoms include:  Loss of bone (osteoporosis) causing breaks (fractures).  Depression.  Hardening and narrowing of the arteries (atherosclerosis) causing heart attacks and strokes.  How is this diagnosed?  When the menstrual periods have stopped for 12 straight months.  Physical exam.  Hormone studies of the blood. How is this treated? There are many treatment  choices and nearly as many questions about them. The decisions to treat or not to treat menopausal changes is an individual choice made with your health care provider. Your health care provider can discuss the treatments with you. Together, you can decide which treatment will work best for you. Your treatment choices may include:  Hormone therapy (estrogen and progesterone).  Non-hormonal medicines.  Treating the individual symptoms with medicine (for example antidepressants for depression).  Herbal medicines that may help specific symptoms.  Counseling by a psychiatrist or psychologist.  Group therapy.  Lifestyle changes including: ? Eating healthy. ? Regular exercise. ? Limiting caffeine and alcohol. ? Stress management and meditation.  No treatment.  Follow these instructions at home:  Take the medicine your health care provider gives you as directed.  Get plenty of sleep and rest.  Exercise regularly.  Eat a diet that contains calcium (good for the bones) and soy products (acts like estrogen hormone).  Avoid alcoholic beverages.  Do not smoke.  If you have hot flashes, dress in layers.  Take supplements, calcium, and vitamin D to strengthen bones.  You can use over-the-counter lubricants or moisturizers for vaginal dryness.  Group therapy is sometimes very helpful.  Acupuncture may be helpful in some cases. Contact a health care provider if:  You are not sure you are in menopause.  You are having menopausal symptoms and need advice and treatment.  You are still having menstrual periods after age 55 years.  You have pain with intercourse.  Menopause is complete (no menstrual period for 12 months) and you develop vaginal bleeding.  You need a referral to a specialist (gynecologist, psychiatrist, or psychologist) for treatment. Get help right   away if:  You have severe depression.  You have excessive vaginal bleeding.  You fell and think you have a  broken bone.  You have pain when you urinate.  You develop leg or chest pain.  You have a fast pounding heart beat (palpitations).  You have severe headaches.  You develop vision problems.  You feel a lump in your breast.  You have abdominal pain or severe indigestion. This information is not intended to replace advice given to you by your health care provider. Make sure you discuss any questions you have with your health care provider. Document Released: 03/27/2003 Document Revised: 06/12/2015 Document Reviewed: 08/03/2012 Elsevier Interactive Patient Education  2017 Elsevier Inc.  

## 2016-11-08 ENCOUNTER — Other Ambulatory Visit (INDEPENDENT_AMBULATORY_CARE_PROVIDER_SITE_OTHER): Payer: 59 | Admitting: Internal Medicine

## 2016-11-08 DIAGNOSIS — E039 Hypothyroidism, unspecified: Secondary | ICD-10-CM

## 2016-11-08 NOTE — Addendum Note (Signed)
Addended by: Mady Haagensen on: 11/08/2016 09:23 AM   Modules accepted: Orders

## 2016-11-11 ENCOUNTER — Ambulatory Visit (INDEPENDENT_AMBULATORY_CARE_PROVIDER_SITE_OTHER): Payer: 59 | Admitting: Internal Medicine

## 2016-11-11 VITALS — BP 112/80 | HR 95 | Temp 98.0°F | Wt 187.0 lb

## 2016-11-11 DIAGNOSIS — E039 Hypothyroidism, unspecified: Secondary | ICD-10-CM

## 2016-11-11 DIAGNOSIS — G4700 Insomnia, unspecified: Secondary | ICD-10-CM

## 2016-11-11 DIAGNOSIS — E7800 Pure hypercholesterolemia, unspecified: Secondary | ICD-10-CM

## 2016-11-11 DIAGNOSIS — R609 Edema, unspecified: Secondary | ICD-10-CM | POA: Diagnosis not present

## 2016-11-11 DIAGNOSIS — Z8669 Personal history of other diseases of the nervous system and sense organs: Secondary | ICD-10-CM | POA: Diagnosis not present

## 2016-11-11 DIAGNOSIS — K580 Irritable bowel syndrome with diarrhea: Secondary | ICD-10-CM | POA: Diagnosis not present

## 2016-11-11 LAB — HEPATIC FUNCTION PANEL
ALBUMIN/GLOBULIN RATIO: 1.6 (calc) (ref 0.9–2.3)
ALKALINE PHOSPHATASE (APISO): 57 U/L (ref 33–130)
ALT: 25 U/L (ref 6–29)
AST: 20 U/L (ref 10–35)
Albumin: 4.6 g/dL (ref 3.6–5.1)
BILIRUBIN INDIRECT: 0.6 mg/dL (ref 0.2–1.2)
Bilirubin, Direct: 0.1 mg/dL (ref 0.0–0.2)
GLOBULIN: 2.8 g/dL (ref 2.2–4.0)
TOTAL PROTEIN: 7.4 g/dL (ref 6.4–8.4)
Total Bilirubin: 0.7 mg/dL (ref 0.2–1.2)

## 2016-11-11 LAB — LIPID PANEL
CHOL/HDL RATIO: 2.1 (calc) (ref <5.0)
Cholesterol: 199 mg/dL (ref <200)
HDL: 96 mg/dL (ref >50)
LDL Cholesterol (Calc): 83 mg/dL (calc)
NON-HDL CHOLESTEROL (CALC): 103 mg/dL (ref <130)
Triglycerides: 102 mg/dL (ref <150)

## 2016-11-11 LAB — TSH: TSH: 1.72 m[IU]/L

## 2016-11-11 LAB — TEST AUTHORIZATION

## 2016-11-11 NOTE — Progress Notes (Signed)
   Subjective:    Patient ID: Rebecca Mccoy, female    DOB: 1965-06-07, 51 y.o.   MRN: [id number]  HPI 51 year old female with intermittent abdominal pain and diarrhea improved with Robinul Forte. Colonoscopy was normal.  Had IUD removed. Is menopausal.  Recent FSH 95.6 at GYN office.  At last visit, TSH was 3.71 and levothyroxine was changed to 0.125 mg daily. TSH is now improved at 1.72.  Started Crestor in July when total cholesterol was 236 and LDL cholesterol 139.  There has been no progress in the previous several months.  Is on 5 mg daily.  Her health maintenance exam is due in January and we can recheck lipids at that time.  Doing well with insomnia medication.  No new complaints.    Review of Systems See above    Objective:   Physical Exam  No thyromegaly.  Chest clear.  Cardiac exam regular rate and rhythm normal S1 and S2.  Abdomen unremarkable.  Extremities without edema.      Assessment & Plan:  Irritable bowel syndrome improved with Robinul Forte  Hypothyroidism-continue current dose of levothyroxine  Hyperlipidemia-now on low-dose Crestor 5 mg daily and follow-up in January  Insomnia-continue current treatment with Xanax  Migraine headaches-stable  Allergic rhinitis  Fluid retention treated with diuretic  Plan: Health maintenance exam due January 2019.

## 2016-11-13 ENCOUNTER — Encounter: Payer: Self-pay | Admitting: Internal Medicine

## 2016-11-13 NOTE — Patient Instructions (Signed)
Continue current dose of levothyroxine.  Continue Crestor.  Follow-up January 2019 with physical examination.

## 2016-11-18 DIAGNOSIS — M79632 Pain in left forearm: Secondary | ICD-10-CM | POA: Diagnosis not present

## 2016-12-02 ENCOUNTER — Telehealth: Payer: Self-pay

## 2016-12-02 MED ORDER — ALPRAZOLAM 0.5 MG PO TABS: 0.5000 mg | ORAL_TABLET | Freq: Every evening | ORAL | 5 refills | Status: DC | PRN

## 2016-12-02 NOTE — Telephone Encounter (Signed)
Received fax from [facility] in regards to a refill on Xanax 0,5 mg for patient. Medication was refilled per Dr. Verlene Mayer request. Sent 1 year

## 2016-12-06 ENCOUNTER — Encounter (INDEPENDENT_AMBULATORY_CARE_PROVIDER_SITE_OTHER): Payer: Self-pay

## 2016-12-06 ENCOUNTER — Encounter: Payer: Self-pay | Admitting: Gastroenterology

## 2016-12-06 ENCOUNTER — Ambulatory Visit: Payer: 59 | Admitting: Gastroenterology

## 2016-12-06 VITALS — BP 104/70 | HR 80 | Ht 61.25 in | Wt 186.4 lb

## 2016-12-06 DIAGNOSIS — K580 Irritable bowel syndrome with diarrhea: Secondary | ICD-10-CM

## 2016-12-06 NOTE — Progress Notes (Signed)
Review of pertinent gastrointestinal problems: 1. History of precancerous polyps: colonoscopy in December 2017 with finding of a 6 mm polyp in the ascending colon and an otherwise normal exam. Biopsy showed a sessile serrated polyp no dysplasia and she is indicated for 5 year interval follow-up. 2. LLQ pains 2017: CT of the abdomen and pelvis in November 2017 which showed a few scattered small diverticuli, and IUD, and a 1.6 x 1.6 cm stone in the upper pole of the left kidney, no hydronephrosis.  HPI: This is a very pleasant 51 year old woman who was last here in the office about 5 months ago  Chief complaint is diarrhea, abdominal pain  Both of her symptoms have nearly completely resolved since her last visit.  Labs 07/2016: cbc, cmet normal. ESR slightly elevated 47; 06/2016 GI path panel negative.  Started robinul at last visit.  The diarrhea is gone.  The LLQ pains are much much better.  She takes the robinul once a day.  Bothered by ;pain only rarely, once per month  ROS: complete GI ROS as described in HPI, all other review negative.  Constitutional:  No unintentional weight loss  Her weight is up 10 pounds since last visit 5 months ago.   Past Medical History:  Diagnosis Date  . Fluid retention   . GERD (gastroesophageal reflux disease)   . Hypertension   . Thyroid disease     Past Surgical History:  Procedure Laterality Date  . AUGMENTATION MAMMAPLASTY Bilateral 2005  . BREAST SURGERY  2005   Augmentation  . CARPAL TUNNEL RELEASE Bilateral    R in '07 and L '06  . leg lift    . TONSILLECTOMY  1972  . TUBAL LIGATION  1989  . tummy tuck  2005    Current Outpatient Medications  Medication Sig Dispense Refill  . ALPRAZolam (XANAX) 0.5 MG tablet Take 1 tablet (0.5 mg total) at bedtime as needed by mouth for anxiety. 30 tablet 5  . Biotin 5000 MCG CAPS Take 1 capsule by mouth daily.    Marland Kitchen glucosamine-chondroitin 500-400 MG tablet Take 1 tablet by mouth daily.    Marland Kitchen  glycopyrrolate (ROBINUL) 2 MG tablet Take 1 tablet by mouth daily.  0  . levothyroxine (SYNTHROID, LEVOTHROID) 125 MCG tablet Take 1 tablet (125 mcg total) by mouth daily before breakfast. 90 tablet 1  . PANAX GINSENG PO Take 600 mg by mouth daily.    . Prenatal Vit-Fe Fumarate-FA (MULTIVITAMIN-PRENATAL) 27-0.8 MG TABS tablet Take 1 tablet by mouth daily at 12 noon. OTC prenatal    . rosuvastatin (CRESTOR) 5 MG tablet Take 1 tablet (5 mg total) by mouth daily. 90 tablet 3  . sertraline (ZOLOFT) 100 MG tablet Take 1 tablet (100 mg total) by mouth daily. 90 tablet 1  . triamterene-hydrochlorothiazide (MAXZIDE) 75-50 MG tablet Take 1 tablet by mouth daily. 90 tablet 3  . TURMERIC PO Take 1 tablet daily by mouth.     No current facility-administered medications for this visit.     Allergies as of 12/06/2016  . (No Known Allergies)    Family History  Problem Relation Age of Onset  . Diabetes Mother   . Uterine cancer Mother   . Colon polyps Mother   . Pancreatic cancer Father   . Crohn's disease Cousin   . Colon cancer Neg Hx   . Stomach cancer Neg Hx   . Esophageal cancer Neg Hx   . Rectal cancer Neg Hx   . Liver cancer Neg  Hx     Social History   Socioeconomic History  . Marital status: Married    Spouse name: Not on file  . Number of children: 2  . Years of education: Not on file  . Highest education level: Not on file  Social Needs  . Financial resource strain: Not on file  . Food insecurity - worry: Not on file  . Food insecurity - inability: Not on file  . Transportation needs - medical: Not on file  . Transportation needs - non-medical: Not on file  Occupational History  . Occupation: Garment/textile technologist  Tobacco Use  . Smoking status: Never Smoker  . Smokeless tobacco: Never Used  Substance and Sexual Activity  . Alcohol use: Yes    Alcohol/week: 0.0 oz    Comment: occasional  . Drug use: No  . Sexual activity: Yes    Birth control/protection: IUD,  Surgical    Comment: INTERCOUSE AGE 16, SEXUAL PARTNERS MORE THAN 5  Other Topics Concern  . Not on file  Social History Narrative  . Not on file     Physical Exam: BP 104/70 (BP Location: Left Arm, Patient Position: Sitting, Cuff Size: Normal)   Pulse 80   Ht 5' 1.25" (1.556 m) Comment: height measured without shoes  Wt 186 lb 6 oz (84.5 kg)   BMI 34.93 kg/m  Constitutional: generally well-appearing Psychiatric: alert and oriented x3 Abdomen: soft, nontender, nondistended, no obvious ascites, no peritoneal signs, normal bowel sounds No peripheral edema noted in lower extremities  Assessment and plan: 51 y.o. female with IBS D  Her symptoms are much improved on Robinul which she takes once daily.  She knows to continue this and I am happy to refill it when she needs.  She had colonoscopy 2018, CT scan abdomen pelvis 2017 I do not think any further workup needs to be done at this point.  Please see the "Patient Instructions" section for addition details about the plan.  Owens Loffler, MD [facility] 12/06/2016, 9:16 AM

## 2016-12-06 NOTE — Patient Instructions (Addendum)
Return as needed.  Normal BMI (Body Mass Index- based on height and weight) is between 19 and 25. Your BMI today is Body mass index is 34.93 kg/m. Marland Kitchen Please consider follow up  regarding your BMI with your Primary Care Provider.

## 2016-12-10 ENCOUNTER — Ambulatory Visit (HOSPITAL_COMMUNITY)
Admission: EM | Admit: 2016-12-10 | Discharge: 2016-12-10 | Disposition: A | Discharge status: Home / Self Care | Payer: 59 | Attending: Physician Assistant | Admitting: Physician Assistant

## 2016-12-10 ENCOUNTER — Other Ambulatory Visit: Payer: Self-pay | Admitting: Physician Assistant

## 2016-12-10 ENCOUNTER — Encounter (HOSPITAL_COMMUNITY): Payer: Self-pay | Admitting: Emergency Medicine

## 2016-12-10 DIAGNOSIS — Z1620 Resistance to unspecified antibiotic: Secondary | ICD-10-CM | POA: Insufficient documentation

## 2016-12-10 DIAGNOSIS — B9562 Methicillin resistant Staphylococcus aureus infection as the cause of diseases classified elsewhere: Secondary | ICD-10-CM | POA: Insufficient documentation

## 2016-12-10 DIAGNOSIS — L03019 Cellulitis of unspecified finger: Secondary | ICD-10-CM | POA: Diagnosis not present

## 2016-12-10 DIAGNOSIS — Z23 Encounter for immunization: Secondary | ICD-10-CM | POA: Diagnosis not present

## 2016-12-10 DIAGNOSIS — L02519 Cutaneous abscess of unspecified hand: Secondary | ICD-10-CM

## 2016-12-10 DIAGNOSIS — L03119 Cellulitis of unspecified part of limb: Secondary | ICD-10-CM

## 2016-12-10 MED ORDER — TETANUS-DIPHTH-ACELL PERTUSSIS 5-2.5-18.5 LF-MCG/0.5 IM SUSP
0.5000 mL | Freq: Once | INTRAMUSCULAR | Status: AC
  Administered 2016-12-10: 0.5 mL via INTRAMUSCULAR

## 2016-12-10 MED ORDER — TETANUS-DIPHTH-ACELL PERTUSSIS 5-2.5-18.5 LF-MCG/0.5 IM SUSP
INTRAMUSCULAR | Status: AC
  Filled 2016-12-10: qty 0.5

## 2016-12-10 MED ORDER — IBUPROFEN 800 MG PO TABS: 800.0000 mg | ORAL_TABLET | Freq: Three times a day (TID) | ORAL | 0 refills | Status: DC

## 2016-12-10 MED ORDER — CEPHALEXIN 500 MG PO CAPS: 500.0000 mg | ORAL_CAPSULE | Freq: Four times a day (QID) | ORAL | 0 refills | Status: DC

## 2016-12-10 MED ORDER — FLUCONAZOLE 150 MG PO TABS: 150.0000 mg | ORAL_TABLET | Freq: Once | ORAL | 0 refills | Status: AC

## 2016-12-10 NOTE — ED Triage Notes (Signed)
Pt here with possible spider bite to right pinky that happened 5 days ago with increase pain and redness

## 2016-12-10 NOTE — Discharge Instructions (Signed)
Come back tomorrow if you are getting worse.

## 2016-12-10 NOTE — ED Provider Notes (Addendum)
12/10/2016 5:27 PM   DOB: 1965-04-06 / MRN: [id number]  SUBJECTIVE:  Rebecca Mccoy is a 51 y.o. female presenting for painful insect bite that started about three days ago.  Tells me that since she has been having worsening tenderness and pain with movement of the hand. Can't remember the last tetanus shot.   Immunization History  Administered Date(s) Administered  . Influenza,inj,Quad PF,6+ Mos 12/25/2013, 01/02/2015    She has No Known Allergies.   She  has a past medical history of Fluid retention, GERD (gastroesophageal reflux disease), Hypertension, and Thyroid disease.    She  reports that  has never smoked. she has never used smokeless tobacco. She reports that she drinks alcohol. She reports that she does not use drugs. She  reports that she currently engages in sexual activity. She reports using the following methods of birth control/protection: IUD and Surgical. The patient  has a past surgical history that includes Tonsillectomy (1972); tummy tuck (2005); Breast surgery (2005); Carpal tunnel release (Bilateral); Tubal ligation (1989); leg lift; and Augmentation mammaplasty (Bilateral, 2005).  Her family history includes Colon polyps in her mother; Crohn's disease in her cousin; Diabetes in her mother; Pancreatic cancer in her father; Uterine cancer in her mother.  Review of Systems  Constitutional: Negative for chills and fever.  Skin: Positive for rash. Negative for itching.  Neurological: Negative for dizziness.    OBJECTIVE:  BP (!) 152/92 (BP Location: Left Arm)   Pulse 91   Temp 98.5 F (36.9 C) (Oral)   Resp 18   SpO2 100%   Physical Exam  Constitutional: She is active.  Non-toxic appearance.  Cardiovascular: Normal rate.  Pulmonary/Chest: Effort normal. No tachypnea.  Musculoskeletal:       Hands: Neurological: She is alert.  Skin: Skin is warm and dry. She is not diaphoretic. No pallor.   Risk and benefits discussed and verbal consent obtained. Anesthetic  allergies reviewed. Patient anesthetized using 1:1 mix of 2% lidocaine without epi. A 1 cm incision was made using a number 11 blade and purulent material was expressed.  The was not wound packed. The patient tolerated the procedure without difficulty.   A clean dressing was placed and wound care instructions were provided.    No results found for this or any previous visit (from the past 72 hour(s)).  No results found.  ASSESSMENT AND PLAN:  The encounter diagnosis was Cellulitis and abscess of hand. Keflex and Ibuprofen.  RTC if worsening at any time and would consider the addition of doxy if she comes back before the culture results. Often contracts a yeast infection from ABX and would like diflucan.     The patient is advised to call or return to clinic if she does not see an improvement in symptoms, or to seek the care of the closest emergency department if she worsens with the above plan.   Philis Fendt, MHS, PA-C 12/10/2016 5:27 PM       Tereasa Coop, PA-C 12/10/16 1738

## 2016-12-13 ENCOUNTER — Other Ambulatory Visit: Payer: Self-pay | Admitting: Physician Assistant

## 2016-12-13 LAB — AEROBIC CULTURE  (SUPERFICIAL SPECIMEN)

## 2016-12-13 LAB — AEROBIC CULTURE W GRAM STAIN (SUPERFICIAL SPECIMEN)

## 2016-12-13 MED ORDER — DOXYCYCLINE HYCLATE 100 MG PO CAPS: 100.0000 mg | ORAL_CAPSULE | Freq: Two times a day (BID) | ORAL | 0 refills | Status: AC

## 2016-12-13 NOTE — Progress Notes (Signed)
LMOM advising patient.  If not improving she should go and pick up doxycycline. I have sent this to pharmacy on record today. Philis Fendt, MS, PA-C 2:36 PM, 12/13/2016

## 2016-12-13 NOTE — Progress Notes (Signed)
See most recent phone message. Philis Fendt, MS, PA-C 2:37 PM, 12/13/2016

## 2017-01-19 ENCOUNTER — Encounter: Payer: Self-pay | Admitting: Internal Medicine

## 2017-01-19 ENCOUNTER — Telehealth: Payer: Self-pay | Admitting: Internal Medicine

## 2017-01-19 MED ORDER — LEVOTHYROXINE SODIUM 125 MCG PO TABS: 125.0000 ug | ORAL_TABLET | Freq: Every day | ORAL | 0 refills | Status: DC

## 2017-01-19 NOTE — Telephone Encounter (Signed)
Refill Levothyroxine x 90 days

## 2017-01-20 ENCOUNTER — Other Ambulatory Visit: Payer: Self-pay

## 2017-01-20 MED ORDER — LEVOTHYROXINE SODIUM 125 MCG PO TABS: 125.0000 ug | ORAL_TABLET | Freq: Every day | ORAL | 0 refills | Status: DC

## 2017-02-03 ENCOUNTER — Other Ambulatory Visit: Payer: Self-pay

## 2017-02-03 MED ORDER — SERTRALINE HCL 100 MG PO TABS: 100.0000 mg | ORAL_TABLET | Freq: Every day | ORAL | 3 refills | Status: DC

## 2017-03-10 ENCOUNTER — Ambulatory Visit: Payer: 59 | Admitting: Urgent Care

## 2017-03-10 ENCOUNTER — Encounter: Payer: Self-pay | Admitting: Urgent Care

## 2017-03-10 VITALS — BP 122/82 | HR 92 | Temp 98.2°F | Resp 18 | Ht 61.0 in | Wt 196.0 lb

## 2017-03-10 DIAGNOSIS — R0789 Other chest pain: Secondary | ICD-10-CM

## 2017-03-10 DIAGNOSIS — R519 Headache, unspecified: Secondary | ICD-10-CM

## 2017-03-10 DIAGNOSIS — R05 Cough: Secondary | ICD-10-CM

## 2017-03-10 DIAGNOSIS — R51 Headache: Secondary | ICD-10-CM | POA: Diagnosis not present

## 2017-03-10 DIAGNOSIS — R070 Pain in throat: Secondary | ICD-10-CM | POA: Diagnosis not present

## 2017-03-10 DIAGNOSIS — J3489 Other specified disorders of nose and nasal sinuses: Secondary | ICD-10-CM

## 2017-03-10 DIAGNOSIS — J019 Acute sinusitis, unspecified: Secondary | ICD-10-CM

## 2017-03-10 DIAGNOSIS — R059 Cough, unspecified: Secondary | ICD-10-CM

## 2017-03-10 MED ORDER — AMOXICILLIN 500 MG PO CAPS: 500.0000 mg | ORAL_CAPSULE | Freq: Three times a day (TID) | ORAL | 0 refills | Status: DC

## 2017-03-10 MED ORDER — BENZONATATE 100 MG PO CAPS
100.0000 mg | ORAL_CAPSULE | Freq: Three times a day (TID) | ORAL | 0 refills | Status: DC | PRN
Start: 2017-03-10 — End: 2017-03-17

## 2017-03-10 MED ORDER — HYDROCODONE-HOMATROPINE 5-1.5 MG/5ML PO SYRP: 5.0000 mL | ORAL_SOLUTION | Freq: Every evening | ORAL | 0 refills | Status: DC | PRN

## 2017-03-10 MED ORDER — FLUCONAZOLE 150 MG PO TABS: 150.0000 mg | ORAL_TABLET | ORAL | 0 refills | Status: DC

## 2017-03-10 NOTE — Progress Notes (Signed)
  MRN: [id number] DOB: 09/18/1965  Subjective:   Rebecca Mccoy is a 52 y.o. female presenting for 1 week history of worsening sinus pain and congestion, sinus headache, sinus pain, sore throat, productive cough that elicits chest pain. Has tried APAP, NyQuil with minimal relief. Denies fever, migraine, photosensitivity, dizziness, ear pain, ear drainage, shob, wheezing, n/v, abdominal pain, rashes. Has not gotten her flu shot this season. Denies smoking cigarettes.   Rebecca Mccoy has a current medication list which includes the following prescription(s): alprazolam, biotin, cyanocobalamin, garlic, glucosamine-chondroitin, glycopyrrolate, levothyroxine, ginseng, multivitamin-prenatal, probiotic product, rosuvastatin, sertraline, triamterene-hydrochlorothiazide, and turmeric. Also has No Known Allergies.  Rebecca Mccoy  has a past medical history of Fluid retention, GERD (gastroesophageal reflux disease), Hypertension, and Thyroid disease. Also  has a past surgical history that includes Tonsillectomy (1972); tummy tuck (2005); Breast surgery (2005); Carpal tunnel release (Bilateral); Tubal ligation (1989); leg lift; and Augmentation mammaplasty (Bilateral, 2005). Her family history includes Colon polyps in her mother; Crohn's disease in her cousin; Diabetes in her mother; Pancreatic cancer in her father; Uterine cancer in her mother.   Objective:   Vitals: BP 122/82   Pulse 92   Temp 98.2 F (36.8 C) (Oral)   Resp 18   Ht 5\' 1"  (1.549 m)   Wt 196 lb (88.9 kg)   SpO2 98%   BMI 37.03 kg/m   Physical Exam  Constitutional: She is oriented to person, place, and time. She appears well-developed and well-nourished.  HENT:  TM's intact bilaterally, no effusions or erythema. Nasal turbinates erythematous, moist, nasal passages minimally patent. Bilateral maxillary sinus tenderness. Oropharynx with moderate post-nasal drainage but no exudates, mucous membranes moist.    Eyes: EOM are normal. Right eye exhibits no  discharge. Left eye exhibits no discharge.  Neck: Normal range of motion. Neck supple.  Bilateral anterior cervical lymph node tenderness.  Cardiovascular: Normal rate, regular rhythm and intact distal pulses. Exam reveals no gallop and no friction rub.  No murmur heard. Pulmonary/Chest: No respiratory distress. She has no wheezes. She has no rales.  Musculoskeletal: She exhibits no edema.  Lymphadenopathy:    She has no cervical adenopathy.  Neurological: She is alert and oriented to person, place, and time.  Skin: Skin is warm and dry.  Psychiatric: She has a normal mood and affect.   Assessment and Plan :   Acute non-recurrent sinusitis, unspecified location  Sinus pain  Sinus headache  Cough  Atypical chest pain  Throat pain  Will cover for sinusitis with Amoxicillin. Supportive care recommended otherwise. Return-to-clinic precautions discussed, patient verbalized understanding.   Jaynee Eagles, PA-C [facility] [phone] 03/10/2017  9:38 AM

## 2017-03-10 NOTE — Patient Instructions (Addendum)
Hydrate well with at least 2 liters (1 gallon) of water daily. For sore throat try using a honey-based tea. Use 3 teaspoons of honey with juice squeezed from half lemon. Place shaved pieces of ginger into 1/2-1 cup of water and warm over stove top. Then mix the ingredients and repeat every 4 hours as needed.     Sinusitis, Adult Sinusitis is soreness and inflammation of your sinuses. Sinuses are hollow spaces in the bones around your face. Your sinuses are located:  Around your eyes.  In the middle of your forehead.  Behind your nose.  In your cheekbones.  Your sinuses and nasal passages are lined with a stringy fluid (mucus). Mucus normally drains out of your sinuses. When your nasal tissues become inflamed or swollen, the mucus can become trapped or blocked so air cannot flow through your sinuses. This allows bacteria, viruses, and funguses to grow, which leads to infection. Sinusitis can develop quickly and last for 7?10 days (acute) or for more than 12 weeks (chronic). Sinusitis often develops after a cold. What are the causes? This condition is caused by anything that creates swelling in the sinuses or stops mucus from draining, including:  Allergies.  Asthma.  Bacterial or viral infection.  Abnormally shaped bones between the nasal passages.  Nasal growths that contain mucus (nasal polyps).  Narrow sinus openings.  Pollutants, such as chemicals or irritants in the air.  A foreign object stuck in the nose.  A fungal infection. This is rare.  What increases the risk? The following factors may make you more likely to develop this condition:  Having allergies or asthma.  Having had a recent cold or respiratory tract infection.  Having structural deformities or blockages in your nose or sinuses.  Having a weak immune system.  Doing a lot of swimming or diving.  Overusing nasal sprays.  Smoking.  What are the signs or symptoms? The main symptoms of this  condition are pain and a feeling of pressure around the affected sinuses. Other symptoms include:  Upper toothache.  Earache.  Headache.  Bad breath.  Decreased sense of smell and taste.  A cough that may get worse at night.  Fatigue.  Fever.  Thick drainage from your nose. The drainage is often green and it may contain pus (purulent).  Stuffy nose or congestion.  Postnasal drip. This is when extra mucus collects in the throat or back of the nose.  Swelling and warmth over the affected sinuses.  Sore throat.  Sensitivity to light.  How is this diagnosed? This condition is diagnosed based on symptoms, a medical history, and a physical exam. To find out if your condition is acute or chronic, your health care provider may:  Look in your nose for signs of nasal polyps.  Tap over the affected sinus to check for signs of infection.  View the inside of your sinuses using an imaging device that has a light attached (endoscope).  If your health care provider suspects that you have chronic sinusitis, you may also:  Be tested for allergies.  Have a sample of mucus taken from your nose (nasal culture) and checked for bacteria.  Have a mucus sample examined to see if your sinusitis is related to an allergy.  If your sinusitis does not respond to treatment and it lasts longer than 8 weeks, you may have an MRI or CT scan to check your sinuses. These scans also help to determine how severe your infection is. In rare cases, a  bone biopsy may be done to rule out more serious types of fungal sinus disease. How is this treated? Treatment for sinusitis depends on the cause and whether your condition is chronic or acute. If a virus is causing your sinusitis, your symptoms will go away on their own within 10 days. You may be given medicines to relieve your symptoms, including:  Topical nasal decongestants. They shrink swollen nasal passages and let mucus drain from your  sinuses.  Antihistamines. These drugs block inflammation that is triggered by allergies. This can help to ease swelling in your nose and sinuses.  Topical nasal corticosteroids. These are nasal sprays that ease inflammation and swelling in your nose and sinuses.  Nasal saline washes. These rinses can help to get rid of thick mucus in your nose.  If your condition is caused by bacteria, you will be given an antibiotic medicine. If your condition is caused by a fungus, you will be given an antifungal medicine. Surgery may be needed to correct underlying conditions, such as narrow nasal passages. Surgery may also be needed to remove polyps. Follow these instructions at home: Medicines  Take, use, or apply over-the-counter and prescription medicines only as told by your health care provider. These may include nasal sprays.  If you were prescribed an antibiotic medicine, take it as told by your health care provider. Do not stop taking the antibiotic even if you start to feel better. Hydrate and Humidify  Drink enough water to keep your urine clear or pale yellow. Staying hydrated will help to thin your mucus.  Use a cool mist humidifier to keep the humidity level in your home above 50%.  Inhale steam for 10-15 minutes, 3-4 times a day or as told by your health care provider. You can do this in the bathroom while a hot shower is running.  Limit your exposure to cool or dry air. Rest  Rest as much as possible.  Sleep with your head raised (elevated).  Make sure to get enough sleep each night. General instructions  Apply a warm, moist washcloth to your face 3-4 times a day or as told by your health care provider. This will help with discomfort.  Wash your hands often with soap and water to reduce your exposure to viruses and other germs. If soap and water are not available, use hand sanitizer.  Do not smoke. Avoid being around people who are smoking (secondhand smoke).  Keep all  follow-up visits as told by your health care provider. This is important. Contact a health care provider if:  You have a fever.  Your symptoms get worse.  Your symptoms do not improve within 10 days. Get help right away if:  You have a severe headache.  You have persistent vomiting.  You have pain or swelling around your face or eyes.  You have vision problems.  You develop confusion.  Your neck is stiff.  You have trouble breathing. This information is not intended to replace advice given to you by your health care provider. Make sure you discuss any questions you have with your health care provider. Document Released: 01/04/2005 Document Revised: 08/31/2015 Document Reviewed: 10/30/2014 Elsevier Interactive Patient Education  2018 [employer].     IF you received an x-ray today, you will receive an invoice from [facility]. Please contact [facility] at [phone] with questions or concerns regarding your invoice.   IF you received labwork today, you will receive an invoice from [facility]. Please contact [facility] at [phone] with  questions or concerns regarding your invoice.   Our billing staff will not be able to assist you with questions regarding bills from these companies.  You will be contacted with the lab results as soon as they are available. The fastest way to get your results is to activate your My Chart account. Instructions are located on the last page of this paperwork. If you have not heard from [place] regarding the results in 2 weeks, please contact this office.

## 2017-03-17 ENCOUNTER — Other Ambulatory Visit: Payer: Self-pay

## 2017-03-17 ENCOUNTER — Encounter: Payer: Self-pay | Admitting: Urgent Care

## 2017-03-17 ENCOUNTER — Ambulatory Visit: Payer: 59 | Admitting: Urgent Care

## 2017-03-17 VITALS — BP 113/80 | HR 93 | Temp 98.2°F | Resp 16 | Ht 61.0 in | Wt 193.2 lb

## 2017-03-17 DIAGNOSIS — J3489 Other specified disorders of nose and nasal sinuses: Secondary | ICD-10-CM

## 2017-03-17 DIAGNOSIS — J019 Acute sinusitis, unspecified: Secondary | ICD-10-CM | POA: Diagnosis not present

## 2017-03-17 DIAGNOSIS — R059 Cough, unspecified: Secondary | ICD-10-CM

## 2017-03-17 DIAGNOSIS — R05 Cough: Secondary | ICD-10-CM

## 2017-03-17 MED ORDER — PREDNISONE 20 MG PO TABS: ORAL_TABLET | ORAL | 0 refills | Status: DC

## 2017-03-17 MED ORDER — DOXYCYCLINE HYCLATE 100 MG PO CAPS: 100.0000 mg | ORAL_CAPSULE | Freq: Two times a day (BID) | ORAL | 0 refills | Status: DC

## 2017-03-17 MED ORDER — BENZONATATE 100 MG PO CAPS: 100.0000 mg | ORAL_CAPSULE | Freq: Three times a day (TID) | ORAL | 0 refills | Status: DC | PRN

## 2017-03-17 MED ORDER — HYDROCOD POLST-CPM POLST ER 10-8 MG/5ML PO SUER: 5.0000 mL | Freq: Every evening | ORAL | 0 refills | Status: DC | PRN

## 2017-03-17 NOTE — Patient Instructions (Addendum)
Sinusitis, Adult Sinusitis is soreness and inflammation of your sinuses. Sinuses are hollow spaces in the bones around your face. Your sinuses are located:  Around your eyes.  In the middle of your forehead.  Behind your nose.  In your cheekbones.  Your sinuses and nasal passages are lined with a stringy fluid (mucus). Mucus normally drains out of your sinuses. When your nasal tissues become inflamed or swollen, the mucus can become trapped or blocked so air cannot flow through your sinuses. This allows bacteria, viruses, and funguses to grow, which leads to infection. Sinusitis can develop quickly and last for 7?10 days (acute) or for more than 12 weeks (chronic). Sinusitis often develops after a cold. What are the causes? This condition is caused by anything that creates swelling in the sinuses or stops mucus from draining, including:  Allergies.  Asthma.  Bacterial or viral infection.  Abnormally shaped bones between the nasal passages.  Nasal growths that contain mucus (nasal polyps).  Narrow sinus openings.  Pollutants, such as chemicals or irritants in the air.  A foreign object stuck in the nose.  A fungal infection. This is rare.  What increases the risk? The following factors may make you more likely to develop this condition:  Having allergies or asthma.  Having had a recent cold or respiratory tract infection.  Having structural deformities or blockages in your nose or sinuses.  Having a weak immune system.  Doing a lot of swimming or diving.  Overusing nasal sprays.  Smoking.  What are the signs or symptoms? The main symptoms of this condition are pain and a feeling of pressure around the affected sinuses. Other symptoms include:  Upper toothache.  Earache.  Headache.  Bad breath.  Decreased sense of smell and taste.  A cough that may get worse at night.  Fatigue.  Fever.  Thick drainage from your nose. The drainage is often green and  it may contain pus (purulent).  Stuffy nose or congestion.  Postnasal drip. This is when extra mucus collects in the throat or back of the nose.  Swelling and warmth over the affected sinuses.  Sore throat.  Sensitivity to light.  How is this diagnosed? This condition is diagnosed based on symptoms, a medical history, and a physical exam. To find out if your condition is acute or chronic, your health care provider may:  Look in your nose for signs of nasal polyps.  Tap over the affected sinus to check for signs of infection.  View the inside of your sinuses using an imaging device that has a light attached (endoscope).  If your health care provider suspects that you have chronic sinusitis, you may also:  Be tested for allergies.  Have a sample of mucus taken from your nose (nasal culture) and checked for bacteria.  Have a mucus sample examined to see if your sinusitis is related to an allergy.  If your sinusitis does not respond to treatment and it lasts longer than 8 weeks, you may have an MRI or CT scan to check your sinuses. These scans also help to determine how severe your infection is. In rare cases, a bone biopsy may be done to rule out more serious types of fungal sinus disease. How is this treated? Treatment for sinusitis depends on the cause and whether your condition is chronic or acute. If a virus is causing your sinusitis, your symptoms will go away on their own within 10 days. You may be given medicines to relieve your symptoms,   including:  Topical nasal decongestants. They shrink swollen nasal passages and let mucus drain from your sinuses.  Antihistamines. These drugs block inflammation that is triggered by allergies. This can help to ease swelling in your nose and sinuses.  Topical nasal corticosteroids. These are nasal sprays that ease inflammation and swelling in your nose and sinuses.  Nasal saline washes. These rinses can help to get rid of thick mucus in  your nose.  If your condition is caused by bacteria, you will be given an antibiotic medicine. If your condition is caused by a fungus, you will be given an antifungal medicine. Surgery may be needed to correct underlying conditions, such as narrow nasal passages. Surgery may also be needed to remove polyps. Follow these instructions at home: Medicines  Take, use, or apply over-the-counter and prescription medicines only as told by your health care provider. These may include nasal sprays.  If you were prescribed an antibiotic medicine, take it as told by your health care provider. Do not stop taking the antibiotic even if you start to feel better. Hydrate and Humidify  Drink enough water to keep your urine clear or pale yellow. Staying hydrated will help to thin your mucus.  Use a cool mist humidifier to keep the humidity level in your home above 50%.  Inhale steam for 10-15 minutes, 3-4 times a day or as told by your health care provider. You can do this in the bathroom while a hot shower is running.  Limit your exposure to cool or dry air. Rest  Rest as much as possible.  Sleep with your head raised (elevated).  Make sure to get enough sleep each night. General instructions  Apply a warm, moist washcloth to your face 3-4 times a day or as told by your health care provider. This will help with discomfort.  Wash your hands often with soap and water to reduce your exposure to viruses and other germs. If soap and water are not available, use hand sanitizer.  Do not smoke. Avoid being around people who are smoking (secondhand smoke).  Keep all follow-up visits as told by your health care provider. This is important. Contact a health care provider if:  You have a fever.  Your symptoms get worse.  Your symptoms do not improve within 10 days. Get help right away if:  You have a severe headache.  You have persistent vomiting.  You have pain or swelling around your face or  eyes.  You have vision problems.  You develop confusion.  Your neck is stiff.  You have trouble breathing. This information is not intended to replace advice given to you by your health care provider. Make sure you discuss any questions you have with your health care provider. Document Released: 01/04/2005 Document Revised: 08/31/2015 Document Reviewed: 10/30/2014 Elsevier Interactive Patient Education  2018 [place].     Cough, Adult Coughing is a reflex that clears your throat and your airways. Coughing helps to heal and protect your lungs. It is normal to cough occasionally, but a cough that happens with other symptoms or lasts a long time may be a sign of a condition that needs treatment. A cough may last only 2-3 weeks (acute), or it may last longer than 8 weeks (chronic). What are the causes? Coughing is commonly caused by:  Breathing in substances that irritate your lungs.  A viral or bacterial respiratory infection.  Allergies.  Asthma.  Postnasal drip.  Smoking.  Acid backing up from the stomach into  the esophagus (gastroesophageal reflux).  Certain medicines.  Chronic lung problems, including COPD (or rarely, lung cancer).  Other medical conditions such as heart failure.  Follow these instructions at home: Pay attention to any changes in your symptoms. Take these actions to help with your discomfort:  Take medicines only as told by your health care provider. ? If you were prescribed an antibiotic medicine, take it as told by your health care provider. Do not stop taking the antibiotic even if you start to feel better. ? Talk with your health care provider before you take a cough suppressant medicine.  Drink enough fluid to keep your urine clear or pale yellow.  If the air is dry, use a cold steam vaporizer or humidifier in your bedroom or your home to help loosen secretions.  Avoid anything that causes you to cough at work or at home.  If your  cough is worse at night, try sleeping in a semi-upright position.  Avoid cigarette smoke. If you smoke, quit smoking. If you need help quitting, ask your health care provider.  Avoid caffeine.  Avoid alcohol.  Rest as needed.  Contact a health care provider if:  You have new symptoms.  You cough up pus.  Your cough does not get better after 2-3 weeks, or your cough gets worse.  You cannot control your cough with suppressant medicines and you are losing sleep.  You develop pain that is getting worse or pain that is not controlled with pain medicines.  You have a fever.  You have unexplained weight loss.  You have night sweats. Get help right away if:  You cough up blood.  You have difficulty breathing.  Your heartbeat is very fast. This information is not intended to replace advice given to you by your health care provider. Make sure you discuss any questions you have with your health care provider. Document Released: 07/03/2010 Document Revised: 06/12/2015 Document Reviewed: 03/13/2014 Elsevier Interactive Patient Education  2018 [employer].     IF you received an x-ray today, you will receive an invoice from [facility]. Please contact [facility] at [phone] with questions or concerns regarding your invoice.   IF you received labwork today, you will receive an invoice from [facility]. Please contact [facility] at [phone] with questions or concerns regarding your invoice.   Our billing staff will not be able to assist you with questions regarding bills from these companies.  You will be contacted with the lab results as soon as they are available. The fastest way to get your results is to activate your My Chart account. Instructions are located on the last page of this paperwork. If you have not heard from [place] regarding the results in 2 weeks, please contact this office.

## 2017-03-17 NOTE — Progress Notes (Signed)
   MRN: [id number] DOB: 1965/04/08  Subjective:   Rebecca Mccoy is a 52 y.o. female presenting for follow up on sinusitis. She has finished her amoxicillin and has not had much improvement. Tessalon helps but not Hycodan. Denies fever, still has sinus pain, sinus congestion, productive cough. Denies chest pain, ear pain, sore throat, shob, wheezing, n/v. Denies smoking cigarettes.  Rebecca Mccoy has a current medication list which includes the following prescription(s): alprazolam, amoxicillin, benzonatate, biotin, cyanocobalamin, fluconazole, garlic, glucosamine-chondroitin, glycopyrrolate, hydrocodone-homatropine, levothyroxine, ginseng, multivitamin-prenatal, probiotic product, rosuvastatin, sertraline, triamterene-hydrochlorothiazide, and turmeric. Also has No Known Allergies.  Rebecca Mccoy  has a past medical history of Fluid retention, GERD (gastroesophageal reflux disease), Hypertension, and Thyroid disease. Also  has a past surgical history that includes Tonsillectomy (1972); tummy tuck (2005); Breast surgery (2005); Carpal tunnel release (Bilateral); Tubal ligation (1989); leg lift; and Augmentation mammaplasty (Bilateral, 2005).  Objective:   Vitals: BP 113/80 (BP Location: Right Arm, Patient Position: Sitting, Cuff Size: Large)   Pulse 93   Temp 98.2 F (36.8 C) (Oral)   Resp 16   Ht 5\' 1"  (1.549 m)   Wt 193 lb 3.2 oz (87.6 kg)   SpO2 99%   BMI 36.50 kg/m   Physical Exam  Constitutional: She is oriented to person, place, and time. She appears well-developed and well-nourished.  HENT:  TM's intact bilaterally, no effusions or erythema. Nasal passages minimally patent. Bilateral maxillary, frontal sinus tenderness. Oropharynx with streaks of post-nasal drinage, mucous membranes moist.    Eyes: Right eye exhibits no discharge. Left eye exhibits no discharge.  Neck: Normal range of motion. Neck supple.  Cardiovascular: Normal rate, regular rhythm and intact distal pulses. Exam reveals no gallop  and no friction rub.  No murmur heard. Pulmonary/Chest: No respiratory distress. She has no wheezes. She has no rales.  Lymphadenopathy:    She has no cervical adenopathy.  Neurological: She is alert and oriented to person, place, and time.  Skin: Skin is warm and dry.  Psychiatric: She has a normal mood and affect.   Assessment and Plan :   Acute non-recurrent sinusitis, unspecified location  Sinus pain  Cough  Patient completed amoxicillin. Will start doxycycline, short steroid course. Switch Hycodan to Tussionex. Refilled Tessalon. Counseled patient on potential for adverse effects with medications prescribed today, patient verbalized understanding. Return-to-clinic precautions discussed, patient verbalized understanding.   Rebecca Eagles, PA-C [facility] [phone] 03/17/2017 9:04 AM

## 2017-03-21 ENCOUNTER — Ambulatory Visit: Payer: 59 | Admitting: Internal Medicine

## 2017-03-21 ENCOUNTER — Encounter: Payer: Self-pay | Admitting: Internal Medicine

## 2017-03-21 ENCOUNTER — Ambulatory Visit
Admission: RE | Admit: 2017-03-21 | Discharge: 2017-03-21 | Disposition: A | Discharge status: Home / Self Care | Payer: 59 | Source: Ambulatory Visit | Attending: Internal Medicine | Admitting: Internal Medicine

## 2017-03-21 VITALS — BP 110/88 | HR 110 | Temp 98.1°F | Ht 61.0 in | Wt 192.0 lb

## 2017-03-21 DIAGNOSIS — J22 Unspecified acute lower respiratory infection: Secondary | ICD-10-CM | POA: Diagnosis not present

## 2017-03-21 DIAGNOSIS — R05 Cough: Secondary | ICD-10-CM | POA: Diagnosis not present

## 2017-03-21 DIAGNOSIS — R059 Cough, unspecified: Secondary | ICD-10-CM

## 2017-03-21 MED ORDER — LEVOFLOXACIN 500 MG PO TABS: 500.0000 mg | ORAL_TABLET | Freq: Every day | ORAL | 0 refills | Status: DC

## 2017-03-21 MED ORDER — CEFTRIAXONE SODIUM 1 G IJ SOLR
1.0000 g | Freq: Once | INTRAMUSCULAR | Status: AC
  Administered 2017-03-21: 1 g via INTRAMUSCULAR

## 2017-03-21 NOTE — Progress Notes (Signed)
   Subjective:    Patient ID: Rebecca Mccoy, female    DOB: Mar 14, 1965, 52 y.o.   MRN: [id number]  HPI Seen at Urgent Care Feb 21st with headache, sinus pain, and congestion treated with Amoxicillin. Went back to urgent care February 28 and was given given Doxycycline and  Ssteroids. Now SOB and still coughing.  Has malaise and fatigue.  No fever or shaking chills.  Pulse ox is 94% and was 99% on February 28.  Chest x-ray obtained today shows no active disease.      Review of Systems see above     Objective:   Physical Exam Looks fatigued.  Skin warm and dry.  Nodes none.  Neck is supple.  Chest clear to auscultation.       Assessment & Plan:  Not sure why she is so short of breath except she may have bronchitis.  Concern for occult pneumonia.  Chest x-ray obtained and is negative.  Will change from doxycycline to Levaquin 500 mg daily for 10 days.  Take prednisone and tapering course as directed.  Symbicort inhaler 160/4.5   2 sprays p.o. twice daily.  Encourage patient to take some time off work and rest but she says she is too busy.

## 2017-03-22 MED ORDER — PREDNISONE 20 MG PO TABS: ORAL_TABLET | ORAL | 0 refills | Status: DC

## 2017-03-24 ENCOUNTER — Ambulatory Visit: Payer: 59 | Admitting: Urgent Care

## 2017-03-25 ENCOUNTER — Ambulatory Visit: Payer: 59 | Admitting: Internal Medicine

## 2017-03-25 ENCOUNTER — Telehealth: Payer: Self-pay | Admitting: Internal Medicine

## 2017-03-25 VITALS — BP 120/90 | HR 108 | Temp 98.5°F | Wt 192.0 lb

## 2017-03-25 DIAGNOSIS — J22 Unspecified acute lower respiratory infection: Secondary | ICD-10-CM | POA: Diagnosis not present

## 2017-03-25 MED ORDER — BUDESONIDE-FORMOTEROL FUMARATE 160-4.5 MCG/ACT IN AERO: 2.0000 | INHALATION_SPRAY | Freq: Two times a day (BID) | RESPIRATORY_TRACT | 3 refills | Status: DC

## 2017-03-25 MED ORDER — HYDROCODONE-HOMATROPINE 5-1.5 MG/5ML PO SYRP: 5.0000 mL | ORAL_SOLUTION | Freq: Three times a day (TID) | ORAL | 0 refills | Status: DC | PRN

## 2017-03-25 NOTE — Telephone Encounter (Signed)
2 days left of Prednisone and 4 days left of antibiotics left.  She rested for 2 days as you asked and did nothing.  She said the heaviness in her chest was better until this morning and today, she has that heaviness again.  She describes it almost like she's having an asthma attack.  She wanted to call in because it's the weekend and you also asked her to give you a report in.  The cough started back last night as well.  She hadn't noticed it to be a problem either for 2 days, and it started back again last night.    She has to leave to go out of town for work on Monday.    Pharmacy:  [facility] off of [address]  Phone  #:  [phone]

## 2017-03-25 NOTE — Telephone Encounter (Signed)
Spoke with patient and advised that she needs OV.  She's on her way now to be seen this afternoon.  Will work in.

## 2017-03-25 NOTE — Progress Notes (Signed)
   Subjective:    Patient ID: Rebecca Mccoy, female    DOB: Jun 26, 1965, 52 y.o.   MRN: [id number]  HPI She was seen here on March 4.  She called today complaining of heaviness in her chest almost like an asthma attack.  Has been resting.  Cough restarted back last night.  Was better until last night.  She has to leave to go out of town for work on March 11.  We had her come back to be seen today for further evaluation.  On March 4, her pulse ox on room air was 94%.  Chest x-ray was negative.  She was changed from doxycycline to Levaquin for 10 days.  Was told to complete course of steroids given to her  at urgent care.  Advised Symbicort inhaler 160/4.52 sprays p.o. twice daily.      Review of Systems see above     Objective:   Physical Exam Skin warm and dry.  Nodes none.  Neck is supple.  Blood pressure is 120/90.  Pulse is 108.  She is afebrile and pulse oximetry is 98%.       Assessment & Plan:  Protracted lower respiratory infection  Plan: Hycodan 1 teaspoon p.o. every 8 hours as needed cough.  Finish course of Levaquin 500 mg daily which was prescribed for 10 days on March 4.  Tapering course of prednisone.

## 2017-03-25 NOTE — Telephone Encounter (Signed)
Needs OV.  

## 2017-03-25 NOTE — Progress Notes (Signed)
[id number]

## 2017-03-28 ENCOUNTER — Other Ambulatory Visit: Payer: Self-pay | Admitting: Internal Medicine

## 2017-04-12 NOTE — Patient Instructions (Addendum)
Levaquin 500 mg daily for 10 days.  Chest x-ray is negative.  Take prednisone and tapering course.  Symbicort inhaler 2 sprays p.o. every 12 hours.  Rest and drink plenty of fluids.

## 2017-04-16 ENCOUNTER — Encounter: Payer: Self-pay | Admitting: Internal Medicine

## 2017-04-16 NOTE — Patient Instructions (Signed)
Hycodan 1 teaspoon p.o. every 8 hours as needed cough.  Finish course of Levaquin 500 mg daily to complete a 10-day course.  Prednisone taper.

## 2017-04-25 ENCOUNTER — Other Ambulatory Visit: Payer: Self-pay | Admitting: Internal Medicine

## 2017-04-25 ENCOUNTER — Other Ambulatory Visit: Payer: Self-pay

## 2017-04-25 DIAGNOSIS — Z0000 Encounter for general adult medical examination without abnormal findings: Secondary | ICD-10-CM

## 2017-04-25 DIAGNOSIS — I10 Essential (primary) hypertension: Secondary | ICD-10-CM

## 2017-04-25 DIAGNOSIS — E039 Hypothyroidism, unspecified: Secondary | ICD-10-CM

## 2017-04-25 DIAGNOSIS — Z139 Encounter for screening, unspecified: Secondary | ICD-10-CM

## 2017-04-25 DIAGNOSIS — Z1321 Encounter for screening for nutritional disorder: Secondary | ICD-10-CM

## 2017-05-06 ENCOUNTER — Other Ambulatory Visit: Payer: Self-pay

## 2017-05-06 ENCOUNTER — Ambulatory Visit (HOSPITAL_COMMUNITY)
Admission: EM | Admit: 2017-05-06 | Discharge: 2017-05-06 | Disposition: A | Discharge status: Home / Self Care | Payer: 59 | Attending: Family Medicine | Admitting: Family Medicine

## 2017-05-06 ENCOUNTER — Encounter (HOSPITAL_COMMUNITY): Payer: Self-pay | Admitting: Emergency Medicine

## 2017-05-06 DIAGNOSIS — J209 Acute bronchitis, unspecified: Secondary | ICD-10-CM

## 2017-05-06 MED ORDER — ALBUTEROL SULFATE HFA 108 (90 BASE) MCG/ACT IN AERS: 1.0000 | INHALATION_SPRAY | Freq: Four times a day (QID) | RESPIRATORY_TRACT | 0 refills | Status: DC | PRN

## 2017-05-06 MED ORDER — AZITHROMYCIN 250 MG PO TABS: ORAL_TABLET | ORAL | 0 refills | Status: DC

## 2017-05-06 NOTE — ED Provider Notes (Signed)
Chief Complaint  Patient presents with  . Cough    Rebecca Mccoy here for URI complaints.  Duration: 4 days; exposed to perfume then started coughing. Associated symptoms: sore throat and cough Denies: sinus congestion, sinus pain, rhinorrhea, itchy watery eyes, ear pain, ear drainage, wheezing, shortness of breath, myalgia and fevers Treatment to date: Symbicort, Tessalon Perles Sick contacts: No  ROS:  Const: Denies fevers HEENT: As noted in HPI Lungs: No SOB  Past Medical History:  Diagnosis Date  . Fluid retention   . GERD (gastroesophageal reflux disease)   . Hypertension   . Thyroid disease    Family History  Problem Relation Age of Onset  . Diabetes Mother   . Uterine cancer Mother   . Colon polyps Mother   . Pancreatic cancer Father   . Crohn's disease Cousin   . Colon cancer Neg Hx   . Stomach cancer Neg Hx   . Esophageal cancer Neg Hx   . Rectal cancer Neg Hx   . Liver cancer Neg Hx     BP (!) 141/86 (BP Location: Left Arm)   Pulse 80   Temp 98.3 F (36.8 C) (Oral)   SpO2 100%  General: Awake, alert, appears stated age HEENT: AT, [place], ears patent b/l and TM's neg, nares patent w/o discharge, pharynx pink and without exudates, MMM Neck: No masses or asymmetry Heart: RRR Lungs: CTAB, no accessory muscle use Psych: Age appropriate judgment and insight, normal mood and affect  Acute bronchitis, unspecified organism  Zpak, cont Hycodan and Perles prn. Albuterol called in as it sounds like she may have irritant/allergy induced asthma. Suggested she try this first before using abx.  Continue to push fluids, practice good hand hygiene, cover mouth when coughing. F/u prn. If starting to experience fevers, shaking, or shortness of breath, seek immediate care. Pt voiced understanding and agreement to the plan.  Shelda Pal, DO @TODAY @ 7:01 PM     Shelda Pal, DO 05/06/17 1902

## 2017-05-06 NOTE — Discharge Instructions (Signed)
Continue to push fluids, practice good hand hygiene, and cover your mouth if you cough.  If you start having fevers, shaking or shortness of breath, seek immediate care.  Use albuterol if you are coughing, wheezing, or short of breath.

## 2017-05-06 NOTE — ED Triage Notes (Signed)
States was Dx with bronchitis 8 weeks ago and c/o continuous cough, non-productive and hoarseness onset Monday AM

## 2017-05-10 ENCOUNTER — Encounter: Payer: Self-pay | Admitting: Internal Medicine

## 2017-05-10 ENCOUNTER — Telehealth: Payer: Self-pay | Admitting: Internal Medicine

## 2017-05-10 ENCOUNTER — Ambulatory Visit: Payer: 59 | Admitting: Internal Medicine

## 2017-05-10 VITALS — BP 120/88 | HR 92 | Temp 98.1°F | Ht 61.0 in | Wt 192.0 lb

## 2017-05-10 DIAGNOSIS — J22 Unspecified acute lower respiratory infection: Secondary | ICD-10-CM

## 2017-05-10 MED ORDER — PREDNISONE 10 MG PO TABS: ORAL_TABLET | ORAL | 0 refills | Status: DC

## 2017-05-10 MED ORDER — ALBUTEROL SULFATE HFA 108 (90 BASE) MCG/ACT IN AERS: 1.0000 | INHALATION_SPRAY | Freq: Four times a day (QID) | RESPIRATORY_TRACT | 99 refills | Status: DC | PRN

## 2017-05-10 MED ORDER — LEVOFLOXACIN 500 MG PO TABS: 500.0000 mg | ORAL_TABLET | Freq: Every day | ORAL | 0 refills | Status: DC

## 2017-05-10 MED ORDER — HYDROCODONE-HOMATROPINE 5-1.5 MG/5ML PO SYRP: 5.0000 mL | ORAL_SOLUTION | Freq: Three times a day (TID) | ORAL | 0 refills | Status: DC | PRN

## 2017-05-10 NOTE — Telephone Encounter (Signed)
See today

## 2017-05-10 NOTE — Progress Notes (Signed)
   Subjective:    Patient ID: Rebecca Mccoy, female    DOB: 11-23-1965, 52 y.o.   MRN: [id number]  HPI  She was in [place] last week and developed cough with URI symptoms.  Had nasal congestion as well.    She had protracted sinusitis and lower respiratory infection in late February and early March.  This was initially treated with amoxicillin then doxycycline and steroids.  On March 4 was found to have decreased pulse oximetry of 94%.  She was complaining of shortness of breath.  She had a chest x-ray that was negative.  Was treated with Levaquin 500 mg daily for 10 days as well as oral steroids.  Was given a Symbicort inhaler.  She improved.  She has never been allergy tested.   Review of Systems see above     Objective:   Physical Exam Skin warm and dry.  Nodes none.  Neck is supple.  TMs and pharynx are clear.  She sounds nasally congested.  She is coughing.  Chest is clear to auscultation without rales or wheezing.       Assessment & Plan:  Acute bronchitis  Possible allergic rhinitis  Possible asthma this seems to be triggered by respiratory infections  Plan: I would like for her to be allergy tested as well as having spirometry to check for asthma.-Daily 1 teaspoon p.o. every 8 hours as needed cough.  Use Proventil and Symbicort inhalers.  Zithromax Z-PAK take as directed.  Take prednisone and tapering course going from 60 mg to 0 mg over 7 days.

## 2017-05-10 NOTE — Telephone Encounter (Signed)
[name] [phone]  Rebecca Mccoy went to [facility] on Friday, she finished antibiotic last night, her cough is worse, however she is still taking sudafed and allergy medicine at night. She has CPE on Monday and did not feel like she could wait until then.

## 2017-05-12 NOTE — Patient Instructions (Signed)
Take prednisone and tapering course as directed.  Take Zithromax 2 tablets x 1 followed by 1 tablet days 2 through 5.  Use both albuterol and Symbicort inhalers.  Hycodan sparingly for cough.  We will arrange for you to have allergy testing

## 2017-05-13 ENCOUNTER — Other Ambulatory Visit: Payer: 59 | Admitting: Internal Medicine

## 2017-05-13 DIAGNOSIS — Z0000 Encounter for general adult medical examination without abnormal findings: Secondary | ICD-10-CM | POA: Diagnosis not present

## 2017-05-13 DIAGNOSIS — I10 Essential (primary) hypertension: Secondary | ICD-10-CM

## 2017-05-13 DIAGNOSIS — Z139 Encounter for screening, unspecified: Secondary | ICD-10-CM

## 2017-05-13 DIAGNOSIS — Z1321 Encounter for screening for nutritional disorder: Secondary | ICD-10-CM

## 2017-05-13 DIAGNOSIS — E039 Hypothyroidism, unspecified: Secondary | ICD-10-CM

## 2017-05-13 DIAGNOSIS — Z1231 Encounter for screening mammogram for malignant neoplasm of breast: Secondary | ICD-10-CM

## 2017-05-13 LAB — CBC WITH DIFFERENTIAL/PLATELET
BASOS ABS: 79 {cells}/uL (ref 0–200)
Basophils Relative: 1 %
EOS PCT: 1.1 %
Eosinophils Absolute: 87 cells/uL (ref 15–500)
HCT: 39.4 % (ref 35.0–45.0)
HEMOGLOBIN: 13.6 g/dL (ref 11.7–15.5)
Lymphs Abs: 3294 cells/uL (ref 850–3900)
MCH: 31.5 pg (ref 27.0–33.0)
MCHC: 34.5 g/dL (ref 32.0–36.0)
MCV: 91.2 fL (ref 80.0–100.0)
MPV: 9.7 fL (ref 7.5–12.5)
Monocytes Relative: 9 %
NEUTROS ABS: 3729 {cells}/uL (ref 1500–7800)
NEUTROS PCT: 47.2 %
PLATELETS: 276 10*3/uL (ref 140–400)
RBC: 4.32 10*6/uL (ref 3.80–5.10)
RDW: 13.2 % (ref 11.0–15.0)
TOTAL LYMPHOCYTE: 41.7 %
WBC mixed population: 711 cells/uL (ref 200–950)
WBC: 7.9 10*3/uL (ref 3.8–10.8)

## 2017-05-13 LAB — COMPLETE METABOLIC PANEL WITH GFR
AG RATIO: 1.8 (calc) (ref 1.0–2.5)
ALT: 16 U/L (ref 6–29)
AST: 15 U/L (ref 10–35)
Albumin: 4.6 g/dL (ref 3.6–5.1)
Alkaline phosphatase (APISO): 65 U/L (ref 33–130)
BILIRUBIN TOTAL: 0.6 mg/dL (ref 0.2–1.2)
BUN: 22 mg/dL (ref 7–25)
CO2: 32 mmol/L (ref 20–32)
Calcium: 10.2 mg/dL (ref 8.6–10.4)
Chloride: 101 mmol/L (ref 98–110)
Creat: 1.04 mg/dL (ref 0.50–1.05)
GFR, EST AFRICAN AMERICAN: 72 mL/min/{1.73_m2} (ref >=60)
GFR, Est Non African American: 62 mL/min/{1.73_m2} (ref >=60)
Globulin: 2.6 g/dL (calc) (ref 1.9–3.7)
Glucose, Bld: 77 mg/dL (ref 65–99)
POTASSIUM: 3.9 mmol/L (ref 3.5–5.3)
SODIUM: 143 mmol/L (ref 135–146)
TOTAL PROTEIN: 7.2 g/dL (ref 6.1–8.1)

## 2017-05-13 LAB — LIPID PANEL
CHOL/HDL RATIO: 2.6 (calc) (ref <5.0)
Cholesterol: 262 mg/dL — ABNORMAL HIGH (ref <200)
HDL: 100 mg/dL (ref >50)
LDL Cholesterol (Calc): 139 mg/dL (calc) — ABNORMAL HIGH
NON-HDL CHOLESTEROL (CALC): 162 mg/dL — AB (ref <130)
TRIGLYCERIDES: 115 mg/dL (ref <150)

## 2017-05-13 LAB — TSH: TSH: 3.23 mIU/L

## 2017-05-14 LAB — VITAMIN D 25 HYDROXY (VIT D DEFICIENCY, FRACTURES): VIT D 25 HYDROXY: 53 ng/mL (ref 30–100)

## 2017-05-16 ENCOUNTER — Ambulatory Visit (INDEPENDENT_AMBULATORY_CARE_PROVIDER_SITE_OTHER): Payer: 59 | Admitting: Internal Medicine

## 2017-05-16 ENCOUNTER — Encounter: Payer: Self-pay | Admitting: Internal Medicine

## 2017-05-16 VITALS — BP 120/90 | HR 104 | Temp 98.0°F | Ht 61.0 in | Wt 198.0 lb

## 2017-05-16 DIAGNOSIS — E7800 Pure hypercholesterolemia, unspecified: Secondary | ICD-10-CM | POA: Diagnosis not present

## 2017-05-16 DIAGNOSIS — R05 Cough: Secondary | ICD-10-CM

## 2017-05-16 DIAGNOSIS — I10 Essential (primary) hypertension: Secondary | ICD-10-CM

## 2017-05-16 DIAGNOSIS — Z8669 Personal history of other diseases of the nervous system and sense organs: Secondary | ICD-10-CM

## 2017-05-16 DIAGNOSIS — Z0000 Encounter for general adult medical examination without abnormal findings: Secondary | ICD-10-CM

## 2017-05-16 DIAGNOSIS — E039 Hypothyroidism, unspecified: Secondary | ICD-10-CM

## 2017-05-16 DIAGNOSIS — R059 Cough, unspecified: Secondary | ICD-10-CM

## 2017-05-16 DIAGNOSIS — K580 Irritable bowel syndrome with diarrhea: Secondary | ICD-10-CM

## 2017-05-16 LAB — POCT URINALYSIS DIPSTICK
Appearance: NORMAL
Bilirubin, UA: NEGATIVE
Glucose, UA: NEGATIVE
KETONES UA: NEGATIVE
Leukocytes, UA: NEGATIVE
NITRITE UA: NEGATIVE
Odor: NORMAL
PROTEIN UA: NEGATIVE
SPEC GRAV UA: 1.01 (ref 1.010–1.025)
Urobilinogen, UA: 0.2 E.U./dL
pH, UA: 7.5 (ref 5.0–8.0)

## 2017-05-16 MED ORDER — CEFTRIAXONE SODIUM 1 G IJ SOLR
1.0000 g | Freq: Once | INTRAMUSCULAR | Status: AC
  Administered 2017-05-16: 1 g via INTRAMUSCULAR

## 2017-05-16 MED ORDER — ROSUVASTATIN CALCIUM 5 MG PO TABS: ORAL_TABLET | ORAL | 3 refills | Status: DC

## 2017-05-16 MED ORDER — METHYLPREDNISOLONE ACETATE 80 MG/ML IJ SUSP
80.0000 mg | Freq: Once | INTRAMUSCULAR | Status: AC
  Administered 2017-05-16: 80 mg via INTRAMUSCULAR

## 2017-05-16 MED ORDER — MONTELUKAST SODIUM 10 MG PO TABS: 10.0000 mg | ORAL_TABLET | Freq: Every day | ORAL | 3 refills | Status: DC

## 2017-05-16 MED ORDER — HYDROCODONE-HOMATROPINE 5-1.5 MG/5ML PO SYRP: 5.0000 mL | ORAL_SOLUTION | Freq: Three times a day (TID) | ORAL | 0 refills | Status: DC | PRN

## 2017-05-16 MED ORDER — BENZONATATE 100 MG PO CAPS: 100.0000 mg | ORAL_CAPSULE | Freq: Three times a day (TID) | ORAL | 1 refills | Status: DC | PRN

## 2017-05-16 NOTE — Progress Notes (Signed)
Subjective:    Patient ID: SHAWNELL Mccoy, female    DOB: 18-Jan-1966, 52 y.o.   MRN: [id number]  HPI 52 year old Female for health maintenance exam and evaluation of medical issues. Recently seen for asthmatic bronchitis. Is bothered by perfumes and fragrant aerosols. Says she is no better despite recent treatment with prednisone and Levaquin.  She also has albuterol and Symbicort inhalers.  Allergist has her on cancellation list for evaluation in the near future  Not taking Crestor-suggest try taking it 3 times a week.  She says it caused myalgias.  Since she came off it the Crestor, cholesterol has increased.  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  She is overweight.  She has hypothyroidism.  History of migraine headaches.  History of insomnia and anxiety.  She had colonoscopy December 2017.  A 6 mm polyp found in ascending colon which was sessile.  Pathology showed serrated adenoma.  History of recurrent MRSA infections.  Husband has this as well.  Past medical history: History of allergic rhinitis, GE reflux, status post tubal ligation.  History of menorrhagia due to uterine fibroids 2006.  History of bilateral carpal tunnel syndrome status post surgical release.  History of hypertension.  Social history: She is married to Mervin Kung who is also a patient here.  She formally lived in the [place] area between 2008 and 2015 as her previous husband at the time was in the [employer] and was deployed quite often.  She divorced her husband who was in the [employer] in 2011.  The ex-husband is a Administrator in [employer].  Adult daughter living at [facility].  Patient has 1 granddaughter.  She works for the [employer] and travels frequently.  She reports 2 pregnancies and no miscarriages.  Does not smoke.  Social alcohol consumption.  Current husband is employed by the city of [place].  She completed 2 years of college.  They recently moved to a home where there is carpet.  Tetanus  immunization 2011.  History of bacterial vaginosis and Candida vaginitis.  History of right knee pain with x-ray done in 2014 which was negative.  History of cellulitis right thigh July 2014.  History of chondromalacia right knee 2014.  Had exercise tolerance test 2002 for chest pain.  Has had sclerotherapy for varicose veins in 2004.  Right distal phalanx second toe fracture October.  Family history: Mother with history of uterine cancer hypertension and diabetes.  Father died with pancreatic cancer diagnosed at age 23 with history of hypertension and MI.  She had an IUD inserted in 2008.  History of sebaceous cyst on her back 2008.  History of syncope 2008 referred for echocardiogram and an event monitor.  No subsequent issues with syncope.  Tonsillectomy 1972.  Bilateral tubal ligation 1979.  Abdominoplasty 2008.  Breast augmentation 2010.  Right carpal tunnel release February 2010.  Left carpal tunnel release March 2011.  Mirena IUD placed 2013.  History of anxiety disorder seen at behavioral health clinic [facility] [employer] and viable.  History of palpitations.  No known drug allergies.      Review of Systems  Constitutional: Positive for fatigue.  Respiratory: Positive for cough.        She has a deep cough and is coughing frequently in the office today.  Cardiovascular: Negative.   Gastrointestinal: Negative.   Genitourinary: Negative.   Neurological: Negative.   Psychiatric/Behavioral:       History of anxiety and insomnia   concerned about  her weight. Says she is not eating that many calories.     Objective:   Physical Exam  Constitutional: She is oriented to person, place, and time. She appears well-developed and well-nourished. No distress.  HENT:  Head: Normocephalic and atraumatic.  Right Ear: External ear normal.  Left Ear: External ear normal.  Mouth/Throat: Oropharynx is clear and moist.  Eyes: Pupils are equal, round, and reactive to light. Conjunctivae and EOM are  normal.  Neck: No JVD present. No thyromegaly present.  Cardiovascular: Normal rate, regular rhythm and normal heart sounds.  No murmur heard. Pulmonary/Chest: Effort normal and breath sounds normal. No stridor. No respiratory distress. She has no wheezes. She has no rales.  Breasts normal female without masses  Abdominal: Soft. She exhibits no distension and no mass. There is no tenderness. There is no rebound and no guarding.  Genitourinary:  Genitourinary Comments: Pap done 2018.  Bimanual normal  Musculoskeletal: She exhibits no edema.  Lymphadenopathy:    She has no cervical adenopathy.  Neurological: She is oriented to person, place, and time.  Skin: Skin is warm and dry. She is not diaphoretic.  Psychiatric: She has a normal mood and affect. Her behavior is normal. Judgment and thought content normal.          Assessment & Plan:  Protracted asthmatic bronchitis-pending allergy evaluation.  Depo-Medrol 80 mg IM.  Rocephin 1 g IM.  Continue albuterol and Symbicort inhalers.  Singulair 10 mg daily.  Refill Hycodan.  Hypothyroidism-TSH is in the 3 range.  Increase to levothyroxine 0.137 mg and follow-up with TSH in 6 weeks.  Hyperlipidemia-take Crestor 3 times weekly and follow-up in 6 weeks with lipid panel liver functions.  Obesity-may benefit from obesity consultation with Dr. Leafy Ro.  Plan: Depo-Medrol 80 mg IM.  Rocephin 1 g IM.  Singulair 10 mg daily.  Increase levothyroxine to 0.137 mg daily and follow-up with TSH in June.  Have mammogram in the near future.  Colonoscopy is up-to-date.  See allergist for evaluation of cough and recurrent respiratory infections.

## 2017-05-16 NOTE — Patient Instructions (Addendum)
Depo-Medrol 80 mg IM.  Rocephin 1 g IM.  Singulair 10 mg daily.  Follow-up with allergist.  Increase levothyroxine to 0.137 mg daily.  Have mammogram in the near future.  Colonoscopy is up-to-date.

## 2017-05-17 ENCOUNTER — Ambulatory Visit
Admission: RE | Admit: 2017-05-17 | Discharge: 2017-05-17 | Disposition: A | Discharge status: Home / Self Care | Payer: 59 | Source: Ambulatory Visit | Attending: Internal Medicine | Admitting: Internal Medicine

## 2017-05-17 ENCOUNTER — Telehealth: Payer: Self-pay | Admitting: Internal Medicine

## 2017-05-17 DIAGNOSIS — Z1231 Encounter for screening mammogram for malignant neoplasm of breast: Secondary | ICD-10-CM | POA: Diagnosis not present

## 2017-05-17 MED ORDER — LEVOTHYROXINE SODIUM 137 MCG PO TABS: ORAL_TABLET | ORAL | 0 refills | Status: DC

## 2017-05-17 NOTE — Telephone Encounter (Signed)
Call in Levothyroxine 0.137 mg daily #90 with no refill. Has follow up appt.

## 2017-05-17 NOTE — Telephone Encounter (Signed)
Rebecca Mccoy Self [phone]  [facility]  Synthroid     [name] said she went to the pharmacy to pick up prescriptions and they did not have one for the increased dosage of synthroid, she was thinking that it was to to be 151mcg instead of 129mcg.

## 2017-05-18 NOTE — Telephone Encounter (Signed)
rx was e-scribed.  

## 2017-06-09 ENCOUNTER — Encounter: Payer: Self-pay | Admitting: Allergy & Immunology

## 2017-06-09 ENCOUNTER — Ambulatory Visit: Payer: 59 | Admitting: Allergy & Immunology

## 2017-06-09 VITALS — BP 126/84 | HR 92 | Temp 98.5°F | Resp 16 | Ht 62.0 in | Wt 194.4 lb

## 2017-06-09 DIAGNOSIS — J683 Other acute and subacute respiratory conditions due to chemicals, gases, fumes and vapors: Secondary | ICD-10-CM

## 2017-06-09 DIAGNOSIS — J310 Chronic rhinitis: Secondary | ICD-10-CM | POA: Diagnosis not present

## 2017-06-09 MED ORDER — MOMETASONE FURO-FORMOTEROL FUM 100-5 MCG/ACT IN AERO: 2.0000 | INHALATION_SPRAY | Freq: Two times a day (BID) | RESPIRATORY_TRACT | 5 refills | Status: DC

## 2017-06-09 NOTE — Progress Notes (Signed)
NEW PATIENT  Date of Service/Encounter:  06/09/17  Referring provider: Elby Showers, MD   Assessment:   Reactive airways dysfunction syndrome   Non-allergic rhinitis   Anxiety - on medications  Plan/Recommendations:   1. Reactive airways dysfunction syndrome - with subjective improvement with albuterol (per patient) - Lung testing looked normal today. - From your description of your symptoms, it seems that you have reactive airway dysfcuntion syndrome, which is essentially a sensitivity to scents.  - This is essentially treated like asthma. - We will start Dulera two puffs twice daily (same class as Symbicort, but different active ingreidents). - We may consider full pulmonary function testing if there is no improvement at the next visit. - We could also consider an ENT eval for possible vocal cord dysfunction. - Spacer sample and demonstration provided. - Daily controller medication(s): Singulair 10mg  daily and Dulera 200/62mcg two puffs twice daily with spacer - Prior to physical activity: ProAir 2 puffs 10-15 minutes before physical activity. - Rescue medications: ProAir 4 puffs every 4-6 hours as needed - Asthma control goals:  * Full participation in all desired activities (may need albuterol before activity) * Albuterol use two time or less a week on average (not counting use with activity) * Cough interfering with sleep two time or less a month * Oral steroids no more than once a year * No hospitalizations  2. Non-allergic rhinitis - Testing today showed: negative to the entire indoor and outdoor panel - Continue with: Singulair (montelukast) 10mg  daily - Start taking: Nasacort (triamcinolone) one spray per nostril daily - You can use an extra dose of the antihistamine, if needed, for breakthrough symptoms.  - Consider nasal saline rinses 1-2 times daily to remove allergens from the nasal cavities as well as help with mucous clearance (this is especially helpful to  do before the nasal sprays are given)  3. Return in about 1 month (around 07/07/2017).   Subjective:   Rebecca Mccoy is a 52 y.o. female presenting today for evaluation of  Chief Complaint  Patient presents with  . Cough    Pt developed a cough 5 weeks ago and didn't clear up for a while, basic antibotics can't clear it up. Cough changed into bronchitis. She believes it's allegies.    Rebecca Mccoy has a history of the following: Patient Active Problem List   Diagnosis Date Noted  . Reactive airways dysfunction syndrome ([place]) 06/10/2017  . Non-allergic rhinitis 06/10/2017  . Diarrhea 07/30/2016  . Insomnia 07/30/2016  . Hx of adenomatous polyp of colon 07/09/2016  . Hypothyroidism 12/03/2015  . Anxiety and depression 02/17/2014  . History of migraine headaches 02/17/2014    History obtained from: chart review and patient.  [name] was referred by Elby Showers, MD.     Rebecca Mccoy is a 52 y.o. female presenting for evaluation of a cough, chest tightness. Symptoms started in mid February. She was not having amy emotional problems when this first started. 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 She went to [facility] and was given antibiotics and steroids. She did not improve and she came back and her antibiotic was changed. Then she went to see her PCP and was given a "super antibiotic" and a steroid pack. She also received a steroid injection. Then she received another course of antibiotics and steroid. One week later, she received additional  IM Rocephin and more steroids. In total, she received five courses of antibiotic and four courses of steroids. This was all over the course of five weeks.  In April, she did receive albuterol. There is no history of asthma previously. If she had it previously, she never attributed it to asthma. She is using the albuterol 1-2 times per week. Dry  grasses and strong fragrances are triggers. Problems are worse in the fall and the spring. The falling leaves always seem to make things worse. She has never been placed on an inhaled steroid.   She was born and raised in [place] and never had issues until she moved away. She had some problems in [place] with postnasal drip as well as sinus headaches. She has had some sinus infections over time with this. She lived in [place] and [place]. Then when she came back to [place] she had some problems especially with the pine season. She moved to [place] in 2012 and then [place] in 2014. She is now living in [place] on five acres of land. She feels that her symptoms now have worsened.   Symptoms worsened in [place] She also has a carpeted older home which seems to make her symptoms worse. She is on Singulair but has never been on an antihistamine in the recent past. She has tried several in the past.   She does have a history of migraines and is on amitriptyline. This also helped with her anxiety, which she did not know that she had. She was on a beta blocker in the distant past. Evidently this was prescribed when she had an episode of passing out. She had a 24 hour EKG and then was placed on the beta blocker. In any case when these medications were stopped, she realized that she had anxiety issues. This was around 2012. After this, she felt that she was having a heart attack and she had chest tightness. She is on Zoloft and she does well. This dose has remained stable for a long period of time. She has Xanax for sleeping.   Otherwise, there is no history of other atopic diseases, including drug allergies, food allergies, stinging insect allergies, or urticaria. There is no significant infectious history. Vaccinations are up to date.    Past Medical History: Patient Active Problem List   Diagnosis Date Noted  . Reactive airways dysfunction  syndrome ([place]) 06/10/2017  . Non-allergic rhinitis 06/10/2017  . Diarrhea 07/30/2016  . Insomnia 07/30/2016  . Hx of adenomatous polyp of colon 07/09/2016  . Hypothyroidism 12/03/2015  . Anxiety and depression 02/17/2014  . History of migraine headaches 02/17/2014    Medication List:  Allergies as of 06/09/2017   No Known Allergies     Medication List        Accurate as of 06/09/17 11:59 PM. Always use your most recent med list.          albuterol 108 (90 Base) MCG/ACT inhaler Commonly known as:  PROVENTIL HFA;VENTOLIN HFA Inhale 1-2 puffs into the lungs every 6 (six) hours as needed for wheezing or shortness of breath.   ALPRAZolam 0.5 MG tablet Commonly known as:  XANAX Take 1 tablet (0.5 mg total) at bedtime as needed by mouth for anxiety.   benzonatate 100 MG capsule Commonly known as:  TESSALON Take 1 capsule (100 mg total) by mouth 3 (three) times daily as needed for cough.   Biotin 5000 MCG Caps Take  1 capsule by mouth daily.   budesonide-formoterol 160-4.5 MCG/ACT inhaler Commonly known as:  SYMBICORT Inhale 2 puffs into the lungs 2 (two) times daily.   glucosamine-chondroitin 500-400 MG tablet Take 1 tablet by mouth daily.   glycopyrrolate 2 MG tablet Commonly known as:  ROBINUL take 1 tablet by mouth twice a day   HYDROcodone-homatropine 5-1.5 MG/5ML syrup Commonly known as:  HYCODAN Take 5 mLs by mouth every 8 (eight) hours as needed for cough.   levothyroxine 137 MCG tablet Commonly known as:  SYNTHROID, LEVOTHROID Take one tablet by mouth 30 minutes before breakfast and do not lay down for the next 30 minutes.   mometasone-formoterol 100-5 MCG/ACT Aero Commonly known as:  DULERA Inhale 2 puffs into the lungs 2 (two) times daily.   montelukast 10 MG tablet Commonly known as:  SINGULAIR Take 1 tablet (10 mg total) by mouth at bedtime.   multivitamin-prenatal 27-0.8 MG Tabs tablet Take 1 tablet by mouth daily at 12 noon. OTC prenatal   PANAX  GINSENG PO Take 600 mg by mouth daily.   PROBIOTIC DAILY PO Take by mouth.   rosuvastatin 5 MG tablet Commonly known as:  CRESTOR One po 3 times a week   sertraline 100 MG tablet Commonly known as:  ZOLOFT Take 1 tablet (100 mg total) by mouth daily.   triamterene-hydrochlorothiazide 75-50 MG tablet Commonly known as:  MAXZIDE Take 1 tablet by mouth daily.   TURMERIC PO Take 1 tablet daily by mouth.       Birth History: non-contributory.  Developmental History: non-contributory.   Past Surgical History: Past Surgical History:  Procedure Laterality Date  . AUGMENTATION MAMMAPLASTY Bilateral 2005  . BREAST SURGERY  2005   Augmentation  . CARPAL TUNNEL RELEASE Bilateral    R in '07 and L '06  . leg lift    . TONSILLECTOMY  1972  . TUBAL LIGATION  1989  . tummy tuck  2005     Family History: Family History  Problem Relation Age of Onset  . Diabetes Mother   . Uterine cancer Mother   . Colon polyps Mother   . Allergic rhinitis Mother   . Pancreatic cancer Father   . Crohn's disease Cousin   . Colon cancer Neg Hx   . Stomach cancer Neg Hx   . Esophageal cancer Neg Hx   . Rectal cancer Neg Hx   . Liver cancer Neg Hx   . Eczema Neg Hx   . Urticaria Neg Hx   . Asthma Neg Hx      Social History: Joana lives at home with her family. She lives in a house that is 52 years old. There was carpeting throughout the home when they purchased it, but they removed it when they purchased the home. There is electric heating and central cooling. There are dogs inside of the home and wildlife outside of the home. There are dust mite coverings on the bedding. There is no tobacco exposure in the home. 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     Review of Systems: a 14-point review of systems is pertinent for what is mentioned in HPI.  Otherwise, all other systems were negative. Constitutional: negative  other than that listed in the HPI Eyes: negative other than that listed in the HPI Ears, nose, mouth, throat, and face: negative other than that listed in the HPI Respiratory: negative other than that listed in the HPI  Cardiovascular: negative other than that listed in the HPI Gastrointestinal: negative other than that listed in the HPI Genitourinary: negative other than that listed in the HPI Integument: negative other than that listed in the HPI Hematologic: negative other than that listed in the HPI Musculoskeletal: negative other than that listed in the HPI Neurological: negative other than that listed in the HPI Allergy/Immunologic: negative other than that listed in the HPI    Objective:   Blood pressure 126/84, pulse 92, temperature 98.5 F (36.9 C), resp. rate 16, height 5\' 2"  (1.575 m), weight 194 lb 6.4 oz (88.2 kg), SpO2 97 %. Body mass index is 35.56 kg/m.   Physical Exam:  General: Alert, interactive, in no acute distress. Talkative female.  Eyes: No conjunctival injection bilaterally, no discharge on the right, no discharge on the left and no Horner-Trantas dots present. PERRL bilaterally. EOMI without pain. No photophobia.  Ears: Right TM pearly gray with normal light reflex, Left TM pearly gray with normal light reflex, Right TM intact without perforation and Left TM intact without perforation.  Nose/Throat: External nose within normal limits and septum midline. Turbinates edematous and pale with clear discharge. Posterior oropharynx erythematous without cobblestoning in the posterior oropharynx. Tonsils 2+ without exudates.  Tongue without thrush. Neck: Supple without thyromegaly. Trachea midline. Adenopathy: no enlarged lymph nodes appreciated in the anterior cervical, occipital, axillary, epitrochlear, inguinal, or popliteal regions. Lungs: Clear to auscultation without wheezing, rhonchi or rales. No increased work of breathing. CV: Normal S1/S2. No murmurs.  Capillary refill <2 seconds.  Abdomen: Nondistended, nontender. No guarding or rebound tenderness. Bowel sounds present in all fields and hypoactive  Skin: Warm and dry, without lesions or rashes. Extremities:  No clubbing, cyanosis or edema. Neuro:   Grossly intact. No focal deficits appreciated. Responsive to questions.  Diagnostic studies:   Spirometry: results normal (FEV1: 2.63/104%, FVC: 2.91/92%, FEV1/FVC: 90%).    Spirometry consistent with normal pattern.   Allergy Studies:   Indoor/Outdoor Percutaneous Adult Environmental Panel: negative to the entire panel with adequate controls.  Indoor/Outdoor Selected Intradermal Environmental Panel: negative to the entire panel with adequate controls.   Allergy testing results were read and interpreted by myself, documented by clinical staff.       Salvatore Marvel, MD [facility]

## 2017-06-09 NOTE — Patient Instructions (Addendum)
1. Reactive airways dysfunction syndrome ([facility]) - Lung testing looked normal today. - From your description of your symptoms, it seems that you have reactive airway dysfcuntion syndrome, which is essentially a sensitivity to scents and whatnot.  - This is essentially treated like asthma. - We will start Dulera two puffs twice daily (same class as Symbicort, but different active ingreidents). - We may consider full pulmonary function testing if there is no improvement at the next visit. - Spacer sample and demonstration provided. - Daily controller medication(s): Singulair 10mg  daily and Dulera 200/52mcg two puffs twice daily with spacer - Prior to physical activity: ProAir 2 puffs 10-15 minutes before physical activity. - Rescue medications: ProAir 4 puffs every 4-6 hours as needed - Asthma control goals:  * Full participation in all desired activities (may need albuterol before activity) * Albuterol use two time or less a week on average (not counting use with activity) * Cough interfering with sleep two time or less a month * Oral steroids no more than once a year * No hospitalizations  2. Non-allergic rhinitis - Testing today showed: negative to the entire indoor and outdoor panel - Continue with: Singulair (montelukast) 10mg  daily - Start taking: Nasacort (triamcinolone) one spray per nostril daily - You can use an extra dose of the antihistamine, if needed, for breakthrough symptoms.  - Consider nasal saline rinses 1-2 times daily to remove allergens from the nasal cavities as well as help with mucous clearance (this is especially helpful to do before the nasal sprays are given)  3. Return in about 1 month (around 07/07/2017).   Please inform us of any Emergency Department visits, hospitalizations, or changes in symptoms. Call us before going to the ED for breathing or allergy symptoms since we might be able to fit you in for a sick visit. Feel free to contact us anytime with any  questions, problems, or concerns.  It was a pleasure to meet you today!  Websites that have reliable patient information: 1. American Academy of Asthma, Allergy, and Immunology: www.aaaai.org 2. Food Allergy Research and Education (FARE): foodallergy.org 3. Mothers of Asthmatics: http://www.asthmacommunitynetwork.org 4. American College of Allergy, Asthma, and Immunology: MonthlyElectricBill.co.uk   Make sure you are registered to vote!

## 2017-06-10 ENCOUNTER — Encounter: Payer: Self-pay | Admitting: Allergy & Immunology

## 2017-06-10 ENCOUNTER — Other Ambulatory Visit: Payer: Self-pay | Admitting: Internal Medicine

## 2017-06-10 DIAGNOSIS — J310 Chronic rhinitis: Secondary | ICD-10-CM | POA: Insufficient documentation

## 2017-06-10 DIAGNOSIS — J683 Other acute and subacute respiratory conditions due to chemicals, gases, fumes and vapors: Secondary | ICD-10-CM

## 2017-06-10 HISTORY — DX: Chronic rhinitis: J31.0

## 2017-06-10 HISTORY — DX: Other acute and subacute respiratory conditions due to chemicals, gases, fumes and vapors: J68.3

## 2017-06-14 ENCOUNTER — Other Ambulatory Visit: Payer: Self-pay | Admitting: Internal Medicine

## 2017-06-14 DIAGNOSIS — Z5181 Encounter for therapeutic drug level monitoring: Secondary | ICD-10-CM

## 2017-06-14 DIAGNOSIS — E7800 Pure hypercholesterolemia, unspecified: Secondary | ICD-10-CM

## 2017-06-14 DIAGNOSIS — Z79899 Other long term (current) drug therapy: Secondary | ICD-10-CM

## 2017-06-17 ENCOUNTER — Other Ambulatory Visit: Payer: Self-pay | Admitting: Internal Medicine

## 2017-06-24 ENCOUNTER — Other Ambulatory Visit: Payer: 59 | Admitting: Internal Medicine

## 2017-06-30 ENCOUNTER — Other Ambulatory Visit: Payer: Self-pay | Admitting: Internal Medicine

## 2017-06-30 NOTE — Telephone Encounter (Signed)
Refill x 6 months 

## 2017-06-30 NOTE — Telephone Encounter (Signed)
Why does it say Refused?

## 2017-07-01 NOTE — Telephone Encounter (Signed)
Not sure, IT WAS CALLED IN.

## 2017-07-10 ENCOUNTER — Other Ambulatory Visit: Payer: Self-pay | Admitting: Internal Medicine

## 2017-07-11 ENCOUNTER — Encounter: Payer: Self-pay | Admitting: Obstetrics and Gynecology

## 2017-07-13 ENCOUNTER — Other Ambulatory Visit: Payer: Self-pay

## 2017-07-13 ENCOUNTER — Encounter: Payer: Self-pay | Admitting: Obstetrics and Gynecology

## 2017-07-13 ENCOUNTER — Ambulatory Visit: Payer: 59 | Admitting: Allergy & Immunology

## 2017-07-13 ENCOUNTER — Telehealth: Payer: Self-pay

## 2017-07-13 ENCOUNTER — Other Ambulatory Visit: Payer: Self-pay | Admitting: Obstetrics and Gynecology

## 2017-07-13 ENCOUNTER — Ambulatory Visit: Payer: 59 | Admitting: Obstetrics and Gynecology

## 2017-07-13 VITALS — BP 124/70 | HR 80 | Resp 14 | Ht 62.0 in | Wt 198.0 lb

## 2017-07-13 DIAGNOSIS — N912 Amenorrhea, unspecified: Secondary | ICD-10-CM

## 2017-07-13 DIAGNOSIS — R252 Cramp and spasm: Secondary | ICD-10-CM

## 2017-07-13 DIAGNOSIS — R4586 Emotional lability: Secondary | ICD-10-CM

## 2017-07-13 DIAGNOSIS — G4730 Sleep apnea, unspecified: Secondary | ICD-10-CM

## 2017-07-13 DIAGNOSIS — R0683 Snoring: Secondary | ICD-10-CM

## 2017-07-13 DIAGNOSIS — Z0001 Encounter for general adult medical examination with abnormal findings: Secondary | ICD-10-CM | POA: Diagnosis not present

## 2017-07-13 DIAGNOSIS — G479 Sleep disorder, unspecified: Secondary | ICD-10-CM

## 2017-07-13 DIAGNOSIS — N951 Menopausal and female climacteric states: Secondary | ICD-10-CM | POA: Diagnosis not present

## 2017-07-13 DIAGNOSIS — R635 Abnormal weight gain: Secondary | ICD-10-CM

## 2017-07-13 DIAGNOSIS — F5231 Female orgasmic disorder: Secondary | ICD-10-CM

## 2017-07-13 DIAGNOSIS — IMO0002 Reserved for concepts with insufficient information to code with codable children: Secondary | ICD-10-CM

## 2017-07-13 DIAGNOSIS — R6882 Decreased libido: Secondary | ICD-10-CM

## 2017-07-13 MED ORDER — BUPROPION HCL ER (XL) 150 MG PO TB24: 150.0000 mg | ORAL_TABLET | Freq: Every day | ORAL | 1 refills | Status: DC

## 2017-07-13 NOTE — Telephone Encounter (Signed)
Does she snore? Does she stop breathing. We refer these patients to Pulmonary who can assess and order a home sleep test.

## 2017-07-13 NOTE — Telephone Encounter (Signed)
Patient called per her GYN Dr. Talbert Nan she needs to have sleep study done but GYN recommends that you order that. Patient would like to come in to discuss this, is it ok to schedule?

## 2017-07-13 NOTE — Telephone Encounter (Signed)
Please cancel the other script, 90 days with no refills sent. She is supposed to f/u in one month

## 2017-07-13 NOTE — Telephone Encounter (Signed)
Patient requesting 90 supply of Wellbutrin instead of 30 day. Please advise

## 2017-07-13 NOTE — Progress Notes (Signed)
52 y.o. [id number] MarriedCaucasianF here c/o menopausal symtpoms including mood swings, weight gain, night sweats and low sex drive. She has been night sweats intermittently for a couple of years. They went away, in the last few months they returned. The night sweats are intermittent and tolerable. She has a long h/o migraine headaches, now almost a daily headaches (not migraines). She has had mood swings in the last 6 months, worse in the last few months. Not her happy self, not sure if she would say she is depressed, but is a little down. Just moved into the house that she wanted, feels like she should be happy. Not motivated. Sex drive has gone down, not normal for her. More effort to have orgasms, able to enjoy sex. No vaginal dryness.  She is on Zoloft for anxiety for the last 3 years. She doesn't sleep well. Primary gave her Xanax to sleep. Trouble falling asleep and staying asleep. Up 1-2 x a night, voids when she is up. Snores if she is really tired, normal she doesn't.  Weight gain in the last 3 years. In the last year she went from 176 to 198 lbs. No change in diet or exercise. She is walking 3 days a week, does Zumba, exercise video. Total of 3-4 days a week.  Doesn't drink her calories. Low carbohydrates, some protein (3-6 oz) at dinner, lunch is a salad or yogurt. No real breakfast.   In 2005 she was 225 lbs, dropped to 150 with diet and exercise.  She had mirena IUD for 10 years, removed in 10/18, no bleeding since. FSH was 95.6 in 10/18.  In 05/13/17 TSH was 3.23, dose of synthroid was increased. Issues with control, up and down.    No LMP recorded. Patient is perimenopausal.          Sexually active: Yes.    The current method of family planning is tubal ligation.    Exercising: Yes.    walking/ cardio Smoker:  no  Health Maintenance: Pap:  01-30-16 WNL 12-25-13 WNL  History of abnormal Pap:  no MMG:  05-18-17 WNL  Colonoscopy:  12-19-15 polyps  BMD:   Never TDaP:   12-10-16 Gardasil: N/A   reports that she has never smoked. She has never used smokeless tobacco. She reports that she drinks alcohol. She reports that she does not use drugs. 1-2 beers a week. She dose auditing/computer work. She and her husband have a lawn visit.   Past Medical History:  Diagnosis Date  . Anxiety   . Fluid retention   . GERD (gastroesophageal reflux disease)   . Heart murmur    infant  . Hypertension   . Migraines   . PID (pelvic inflammatory disease)   . Recurrent upper respiratory infection (URI)    April 2019  . Thyroid disease   . Urticaria     Past Surgical History:  Procedure Laterality Date  . AUGMENTATION MAMMAPLASTY Bilateral 2005  . BREAST SURGERY  2005   Augmentation  . CARPAL TUNNEL RELEASE Bilateral    R in '07 and L '06  . COSMETIC SURGERY    . leg lift    . TONSILLECTOMY  1972  . TUBAL LIGATION  1989  . tummy tuck  2005    Current Outpatient Medications  Medication Sig Dispense Refill  . albuterol (PROVENTIL HFA;VENTOLIN HFA) 108 (90 Base) MCG/ACT inhaler Inhale 1-2 puffs into the lungs every 6 (six) hours as needed for wheezing or shortness of breath. 1 Inhaler prn  .  ALPRAZolam (XANAX) 0.5 MG tablet TAKE 1 TABLET BY MOUTH AT BEDTIME AS NEEDED 30 tablet 5  . benzonatate (TESSALON) 100 MG capsule Take 1 capsule (100 mg total) by mouth 3 (three) times daily as needed for cough. 30 capsule 1  . budesonide-formoterol (SYMBICORT) 160-4.5 MCG/ACT inhaler Inhale 2 puffs into the lungs 2 (two) times daily. 1 Inhaler 3  . glucosamine-chondroitin 500-400 MG tablet Take 1 tablet by mouth daily.    Marland Kitchen glycopyrrolate (ROBINUL) 2 MG tablet take 1 tablet by mouth twice a day 60 tablet 3  . levothyroxine (SYNTHROID, LEVOTHROID) 137 MCG tablet TAKE 1 TABLET BY MOUTH 30 MINUTES BEFORE BREAKFAST. DO NOT LAY DOWN FOR THE NEXT 30 MINUTES 90 tablet 0  . montelukast (SINGULAIR) 10 MG tablet TAKE 1 TABLET(10 MG) BY MOUTH AT BEDTIME 90 tablet 3  . PANAX GINSENG  PO Take 600 mg by mouth daily.    . Prenatal Vit-Fe Fumarate-FA (MULTIVITAMIN-PRENATAL) 27-0.8 MG TABS tablet Take 1 tablet by mouth daily at 12 noon. OTC prenatal    . Probiotic Product (PROBIOTIC DAILY PO) Take by mouth.    . rosuvastatin (CRESTOR) 5 MG tablet One po 3 times a week 36 tablet 3  . sertraline (ZOLOFT) 100 MG tablet Take 1 tablet (100 mg total) by mouth daily. 90 tablet 3  . triamterene-hydrochlorothiazide (MAXZIDE) 75-50 MG tablet TAKE 1 TABLET BY MOUTH ONCE DAILY 90 tablet 0  . TURMERIC PO Take 1 tablet daily by mouth.     No current facility-administered medications for this visit.     Family History  Problem Relation Age of Onset  . Diabetes Mother   . Uterine cancer Mother   . Colon polyps Mother   . Allergic rhinitis Mother   . Pancreatic cancer Father   . Crohn's disease Cousin   . Colon cancer Neg Hx   . Stomach cancer Neg Hx   . Esophageal cancer Neg Hx   . Rectal cancer Neg Hx   . Liver cancer Neg Hx   . Eczema Neg Hx   . Urticaria Neg Hx   . Asthma Neg Hx     Review of Systems  Constitutional: Negative.        Weight gain   HENT: Negative.   Eyes: Negative.   Respiratory: Negative.   Cardiovascular: Negative.   Gastrointestinal: Negative.   Endocrine: Negative.   Genitourinary: Negative.        Night sweats Low sex drive   Musculoskeletal: Negative.   Skin: Negative.   Allergic/Immunologic: Negative.   Neurological: Negative.   Psychiatric/Behavioral: Negative.        Mood swings     Exam:   BP 124/70 (BP Location: Right Arm, Patient Position: Sitting, Cuff Size: Normal)   Pulse 80   Resp 14   Ht 5\' 2"  (1.575 m)   Wt 198 lb (89.8 kg)   BMI 36.21 kg/m   Weight change: @WEIGHTCHANGE @ Height:   Height: 5\' 2"  (157.5 cm)  Ht Readings from Last 3 Encounters:  07/13/17 5\' 2"  (1.575 m)  06/09/17 5\' 2"  (1.575 m)  05/16/17 5\' 1"  (1.549 m)    General appearance: alert, cooperative and appears stated age Head: Normocephalic, without  obvious abnormality, atraumatic Neck: no adenopathy, supple, symmetrical, trachea midline and thyroid normal to inspection and palpation  A:  Mood changes, on Zoloft, 100 mg  Sleep disturbance  Weight gain  Low libido, orgasmic dysfunction (could be partially from zoloft, but on it for a while)  Minimal  vasomotor symptoms  Leg cramps, prior normal labs.   P:   Start Wellbutrin   Discussed the option of increasing her Zoloft, but would likely worsen her sexual dysfunction  Recommend she have a sleep study (she will likely set it up with her primary)  Check FSH, testosterone, TSH today  Discussed weight loss, portion control, increase in exercise  Information on libido given    ~45 minutes was spent face to face, 80% in counseling

## 2017-07-13 NOTE — Patient Instructions (Signed)
Leg Cramps Leg cramps occur when a muscle or muscles tighten and you have no control over this tightening (involuntary muscle contraction). Muscle cramps can develop in any muscle, but the most common place is in the calf muscles of the leg. Those cramps can occur during exercise or when you are at rest. Leg cramps are painful, and they may last for a few seconds to a few minutes. Cramps may return several times before they finally stop. Usually, leg cramps are not caused by a serious medical problem. In many cases, the cause is not known. Some common causes include:  Overexertion.  Overuse from repetitive motions, or doing the same thing over and over.  Remaining in a certain position for a long period of time.  Improper preparation, form, or technique while performing a sport or an activity.  Dehydration.  Injury.  Side effects of some medicines.  Abnormally low levels of the salts and ions in your blood (electrolytes), especially potassium and calcium. These levels could be low if you are taking water pills (diuretics) or if you are pregnant.  Follow these instructions at home: Watch your condition for any changes. Taking the following actions may help to lessen any discomfort that you are feeling:  Stay well-hydrated. Drink enough fluid to keep your urine clear or pale yellow.  Try massaging, stretching, and relaxing the affected muscle. Do this for several minutes at a time.  For tight or tense muscles, use a warm towel, heating pad, or hot shower water directed to the affected area.  If you are sore or have pain after a cramp, applying ice to the affected area may relieve discomfort. ? Put ice in a plastic bag. ? Place a towel between your skin and the bag. ? Leave the ice on for 20 minutes, 2-3 times per day.  Avoid strenuous exercise for several days if you have been having frequent leg cramps.  Make sure that your diet includes the essential minerals for your muscles to  work normally.  Take medicines only as directed by your health care provider.  Contact a health care provider if:  Your leg cramps get more severe or more frequent, or they do not improve over time.  Your foot becomes cold, numb, or blue. This information is not intended to replace advice given to you by your health care provider. Make sure you discuss any questions you have with your health care provider. Document Released: 02/12/2004 Document Revised: 06/12/2015 Document Reviewed: 12/12/2013 Elsevier Interactive Patient Education  2018 Else[employer] Inc[employer]  

## 2017-07-14 ENCOUNTER — Other Ambulatory Visit: Payer: 59 | Admitting: Internal Medicine

## 2017-07-14 DIAGNOSIS — Z5181 Encounter for therapeutic drug level monitoring: Secondary | ICD-10-CM

## 2017-07-14 DIAGNOSIS — Z79899 Other long term (current) drug therapy: Secondary | ICD-10-CM

## 2017-07-14 DIAGNOSIS — E7800 Pure hypercholesterolemia, unspecified: Secondary | ICD-10-CM

## 2017-07-14 DIAGNOSIS — E039 Hypothyroidism, unspecified: Secondary | ICD-10-CM

## 2017-07-14 LAB — LIPID PANEL
CHOL/HDL RATIO: 2.4 (calc) (ref <5.0)
CHOLESTEROL: 208 mg/dL — AB (ref <200)
HDL: 87 mg/dL (ref >50)
LDL CHOLESTEROL (CALC): 100 mg/dL — AB
Non-HDL Cholesterol (Calc): 121 mg/dL (calc) (ref <130)
Triglycerides: 116 mg/dL (ref <150)

## 2017-07-14 LAB — HEPATIC FUNCTION PANEL
AG Ratio: 1.7 (calc) (ref 1.0–2.5)
ALT: 16 U/L (ref 6–29)
AST: 19 U/L (ref 10–35)
Albumin: 4.5 g/dL (ref 3.6–5.1)
Alkaline phosphatase (APISO): 73 U/L (ref 33–130)
BILIRUBIN INDIRECT: 0.7 mg/dL (ref 0.2–1.2)
Bilirubin, Direct: 0.2 mg/dL (ref 0.0–0.2)
GLOBULIN: 2.7 g/dL (ref 1.9–3.7)
TOTAL PROTEIN: 7.2 g/dL (ref 6.1–8.1)
Total Bilirubin: 0.9 mg/dL (ref 0.2–1.2)

## 2017-07-14 LAB — TSH
TSH: 2.05 u[IU]/mL (ref 0.450–4.500)
TSH: 2.48 mIU/L

## 2017-07-14 NOTE — Telephone Encounter (Signed)
She said she does snore at night and she can't sleep through the night even on XANAX, patient agreed to go to pulmonary will put in referral.

## 2017-07-15 LAB — TESTT+TESTF+SHBG
SEX HORMONE BINDING: 36 nmol/L (ref 17.3–125.0)
Testosterone, Free: 0.9 pg/mL (ref 0.0–4.2)
Testosterone, total: 25 ng/dL

## 2017-07-15 LAB — FOLLICLE STIMULATING HORMONE: FSH: 78.6 m[IU]/mL

## 2017-07-29 ENCOUNTER — Encounter: Payer: 59 | Admitting: Obstetrics and Gynecology

## 2017-08-10 ENCOUNTER — Other Ambulatory Visit: Payer: Self-pay | Admitting: Obstetrics and Gynecology

## 2017-08-10 NOTE — Telephone Encounter (Signed)
Medication refill request: wellbutrin xl Last AEX:  07/13/17 Next AEX: 08/23/17 Last MMG (if hormonal medication request): Bi-rads Category 1 neg  Refill authorized: please refill if appropriate.

## 2017-08-10 NOTE — Telephone Encounter (Signed)
The patient was given 30 tablet with one refill. That is enough to get her to her f/u appointment. Refill refused.

## 2017-08-19 ENCOUNTER — Other Ambulatory Visit: Payer: Self-pay | Admitting: Internal Medicine

## 2017-08-19 MED ORDER — LEVOTHYROXINE SODIUM 137 MCG PO TABS: ORAL_TABLET | ORAL | 0 refills | Status: DC

## 2017-08-22 NOTE — Progress Notes (Signed)
GYNECOLOGY  VISIT   HPI: 52 y.o.   Married  Caucasian  female   G2P2002 with No LMP recorded. Patient is perimenopausal.   here for 1 month recheck on Wellbutrin. The patient has been on Zoloft, Wellbutrin was added last month secondary to an increase in mood changes. Her depression symptoms have subsided. She continues to have anxiety, but feels it is tolerable. She is taking Xanax at night for sleep.  She has also had an improvement in her libido and ability to orgasm. She c/o frequent and urgent urination at night just prior to going to bed, only happens when she lies down. Can go 3-4 times in 20-30. Small to large amounts. Up 1 x a night to void. The rest of the day her voiding is normal.  She drinks decaffeinated tea during the day. Has 2 cups of coffee in the am. Only 1-2 drinks a week   GYNECOLOGIC HISTORY: No LMP recorded. Patient is perimenopausal. Contraception:Tubal ligation Menopausal hormone therapy: None        OB History    Gravida  2   Para  2   Term  2   Preterm      AB  0   Living  2     SAB      TAB      Ectopic  0   Multiple      Live Births  2              Patient Active Problem List   Diagnosis Date Noted  . 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 Non-allergic rhinitis 06/10/2017  . Diarrhea 07/30/2016  . Insomnia 07/30/2016  . Hx of adenomatous polyp of colon 07/09/2016  . Hypothyroidism 12/03/2015  . Anxiety and depression 02/17/2014  . History of migraine headaches 02/17/2014    Past Medical History:  Diagnosis Date  . Anxiety   . Fluid retention   . GERD (gastroesophageal reflux disease)   . Heart murmur    infant  . Hypertension   . Migraines   . PID (pelvic inflammatory disease)   . Recurrent upper respiratory infection (URI)    April 2019  . Thyroid disease   . Urticaria     Past Surgical History:  Procedure Laterality Date  . AUGMENTATION MAMMAPLASTY Bilateral 2005  . BREAST SURGERY  2005   Augmentation  . CARPAL TUNNEL RELEASE Bilateral    R in '07 and L '06  . COSMETIC SURGERY    . leg lift    . TONSILLECTOMY  1972  . TUBAL LIGATION  1989  . tummy tuck  2005    Current Outpatient Medications  Medication Sig Dispense Refill  . albuterol (PROVENTIL HFA;VENTOLIN HFA) 108 (90 Base) MCG/ACT inhaler Inhale 1-2 puffs into the lungs every 6 (six) hours as needed for wheezing or shortness of breath. 1 Inhaler prn  . ALPRAZolam (XANAX) 0.5 MG tablet TAKE 1 TABLET BY MOUTH AT BEDTIME AS NEEDED 30 tablet 5  . budesonide-formoterol (SYMBICORT) 160-4.5 MCG/ACT inhaler Inhale 2 puffs into the lungs 2 (two) times daily. 1 Inhaler 3  . buPROPion (WELLBUTRIN XL) 150 MG 24 hr tablet TAKE 1 TABLET(150 MG) BY MOUTH DAILY 90 tablet 1  . glucosamine-chondroitin 500-400 MG tablet Take 1 tablet by mouth daily.    Marland Kitchen glycopyrrolate (ROBINUL) 2 MG tablet take 1 tablet by mouth twice a day 60 tablet 3  . levothyroxine (SYNTHROID, LEVOTHROID) 137 MCG tablet TAKE 1 TABLET BY MOUTH 30 MINUTES  BEFORE BREAKFAST. DO NOT LAY DOWN FOR THE NEXT 30 MINUTES 90 tablet 0  . montelukast (SINGULAIR) 10 MG tablet TAKE 1 TABLET(10 MG) BY MOUTH AT BEDTIME (Patient taking differently: TAKE 1 TABLET(10 MG) BY MOUTH) 90 tablet 3  . PANAX GINSENG PO Take 600 mg by mouth daily.    . Prenatal Vit-Fe Fumarate-FA (MULTIVITAMIN-PRENATAL) 27-0.8 MG TABS tablet Take 1 tablet by mouth daily at 12 noon. OTC prenatal    . Probiotic Product (PROBIOTIC DAILY PO) Take by mouth.    . rosuvastatin (CRESTOR) 5 MG tablet One po 3 times a week 36 tablet 3  . sertraline (ZOLOFT) 100 MG tablet Take 1 tablet (100 mg total) by mouth daily. 90 tablet 3  . triamterene-hydrochlorothiazide (MAXZIDE) 75-50 MG tablet TAKE 1 TABLET BY MOUTH ONCE DAILY 90 tablet 0  . TURMERIC PO Take 1 tablet daily by mouth.     No current facility-administered medications for this visit.      ALLERGIES: Patient has no known allergies.  Family History  Problem  Relation Age of Onset  . Diabetes Mother   . Uterine cancer Mother   . Colon polyps Mother   . Allergic rhinitis Mother   . Pancreatic cancer Father   . Hyperlipidemia Father   . Crohn's disease Cousin   . Diabetes Maternal Grandmother   . Stroke Maternal Grandmother   . Diabetes Maternal Grandfather   . Stroke Paternal Grandmother   . Colon cancer Neg Hx   . Stomach cancer Neg Hx   . Esophageal cancer Neg Hx   . Rectal cancer Neg Hx   . Liver cancer Neg Hx   . Eczema Neg Hx   . Urticaria Neg Hx   . Asthma Neg Hx     Social History   Socioeconomic History  . Marital status: Married    Spouse name: Not on file  . Number of children: 2  . Years of education: Not on file  . Highest education level: Not on file  Occupational History  . Occupation: Garment/textile technologist  Social Needs  . Financial resource strain: Not on file  . Food insecurity:    Worry: Not on file    Inability: Not on file  . Transportation needs:    Medical: Not on file    Non-medical: Not on file  Tobacco Use  . Smoking status: Never Smoker  . Smokeless tobacco: Never Used  Substance and Sexual Activity  . Alcohol use: Yes    Alcohol/week: 0.0 - 1.2 oz  . Drug use: No  . Sexual activity: Yes    Partners: Male    Birth control/protection: Surgical    Comment: INTERCOUSE AGE 54, SEXUAL PARTNERS MORE THAN 5  Lifestyle  . Physical activity:    Days per week: Not on file    Minutes per session: Not on file  . Stress: Not on file  Relationships  . Social connections:    Talks on phone: Not on file    Gets together: Not on file    Attends religious service: Not on file    Active member of club or organization: Not on file    Attends meetings of clubs or organizations: Not on file    Relationship status: Not on file  . Intimate partner violence:    Fear of current or ex partner: Not on file    Emotionally abused: Not on file    Physically abused: Not on file    Forced sexual  activity:  Not on file  Other Topics Concern  . Not on file  Social History Narrative  . Not on file    Review of Systems  Constitutional: Negative.   HENT: Negative.   Eyes: Negative.   Respiratory: Negative.   Cardiovascular: Negative.   Gastrointestinal: Negative.   Genitourinary:       Nocturia  Musculoskeletal: Negative.   Skin: Negative.   Neurological: Negative.   Endo/Heme/Allergies: Negative.   Psychiatric/Behavioral: Negative.     PHYSICAL EXAMINATION:    BP (!) 130/92 (BP Location: Right Arm, Patient Position: Sitting)   Pulse 72   Wt 199 lb 3.2 oz (90.4 kg)   BMI 36.43 kg/m     General appearance: alert, cooperative and appears stated age  ASSESSMENT Depression, improved with Wellbutrin Anxiety is tolerable Libido has improved, orgasms have improved Urinary frequency and urgency only at night for 30 minutes after she lies down.     PLAN Will continue with the Wellbutrin Wants to stay on her current dose of Zoloft She has started walking again, helping She will return for an annual exam in the next month, will check for prolapse Urine for ua, c&s   An After Visit Summary was printed and given to the patient.  ~15 minutes spent face to face, over 50% in counseling.

## 2017-08-23 ENCOUNTER — Other Ambulatory Visit: Payer: Self-pay

## 2017-08-23 ENCOUNTER — Encounter: Payer: Self-pay | Admitting: Obstetrics and Gynecology

## 2017-08-23 ENCOUNTER — Ambulatory Visit: Payer: 59 | Admitting: Obstetrics and Gynecology

## 2017-08-23 VITALS — BP 130/92 | HR 72 | Wt 199.2 lb

## 2017-08-23 DIAGNOSIS — R350 Frequency of micturition: Secondary | ICD-10-CM

## 2017-08-23 DIAGNOSIS — F418 Other specified anxiety disorders: Secondary | ICD-10-CM

## 2017-08-23 LAB — POCT URINALYSIS DIPSTICK
Bilirubin, UA: NEGATIVE
Glucose, UA: NEGATIVE
Ketones, UA: NEGATIVE
Nitrite, UA: NEGATIVE
PH UA: 5 (ref 5.0–8.0)
PROTEIN UA: NEGATIVE
RBC UA: POSITIVE
SPEC GRAV UA: 1.01 (ref 1.010–1.025)
Urobilinogen, UA: 0.2 E.U./dL

## 2017-08-23 MED ORDER — BUPROPION HCL ER (XL) 150 MG PO TB24: ORAL_TABLET | ORAL | 3 refills | Status: DC

## 2017-08-24 LAB — URINALYSIS, MICROSCOPIC ONLY: CASTS: NONE SEEN /LPF

## 2017-08-24 LAB — URINE CULTURE

## 2017-09-06 ENCOUNTER — Ambulatory Visit: Payer: 59 | Admitting: Pulmonary Disease

## 2017-09-06 ENCOUNTER — Encounter: Payer: Self-pay | Admitting: Pulmonary Disease

## 2017-09-06 VITALS — BP 136/78 | HR 91 | Ht 62.0 in | Wt 198.0 lb

## 2017-09-06 DIAGNOSIS — R0683 Snoring: Secondary | ICD-10-CM | POA: Diagnosis not present

## 2017-09-06 NOTE — Progress Notes (Signed)
[facility]  Chief Complaint  Patient presents with  . sleep consult    no prior sleep sleep study. c/o trouble falling asleep.    Constitutional: BP 136/78 (BP Location: Left Arm)   Pulse 91   Ht 5\' 2"  (1.575 m)   Wt 198 lb (89.8 kg)   SpO2 98%   BMI 36.21 kg/m   History of Present Illness: Rebecca Mccoy is a 52 y.o. female with difficulty falling asleep.  She has noticed trouble for a while.  Has used melatonin for years.  Started using xanax about 2 years ago.  She can get anxious and has intermittent feelings of depression.  She has trouble shutting her mind down before going to bed.  She takes her medications at 930 pm.  She goes to bed at 10 pm.  She falls asleep after 30 minutes.  Wakes up 1 time to use bathroom, but no trouble going back to sleep.  Her husband says she snores in the earlier morning hours when she is in a "hard sleep".  She gets leg cramps in the early morning hours also.  She doesn't dream much.  She gets out of bed at 730 am.  If she doesn't get out of bed immediately, then she gets a foggy feeling.  She gets frequent headaches.  She was tried on elavil, but this caused a hangover effect.  She isn't using anything to help her stay awake.  She denies sleep walking, sleep talking, bruxism, or nightmares.  There is no history of restless legs.  She denies sleep hallucinations, sleep paralysis, or cataplexy.  The Epworth score is 0 out of 24.  She is prone to getting sinus congestion, post nasal drip and cough related to airway irritants.  When this happens her snoring and sleep get worse.   Comprehensive Respiratory Exam:  Appearance - well kempt  ENMT - narrow nasal angles, nasal mucosa moist, turbinates clear, midline nasal septum, no dental lesions, no gingival bleeding, no oral exudates, MP 4, enlarged tongue, low laying soft palate Neck - no masses, trachea midline, no thyromegaly, no elevation in JVP Respiratory -  normal appearance of chest wall, normal respiratory effort w/o accessory muscle use, no dullness on percussion, no wheezing or rales CV - s1s2 regular rate and rhythm, no murmurs, no peripheral edema, radial pulses symmetric GI - soft, non tender, no masses, no hepatosplenomegaly Lymph - no adenopathy noted in neck and axillary areas MSK - normal muscle strength and tone, normal gait Ext - no cyanosis, clubbing, or joint inflammation noted Skin - no rashes, lesions, or ulcers Neuro - oriented to person, place, and time Psych - normal mood and affect  CXR 03/21/17 (reviewed by me) >> normal heart shadow, lungs clear  Discussion: She has snoring, sleep disruption, and daytime sleepiness.  Her symptoms have progressed since menopause.  Her BMI is > 35.  She has history of anxiety and depression.  She has nocturnal leg cramps, and gets frequent headaches.  She has upper airway anatomy features that are suggestive of risk for sleep apnea.  I am concerned she could have sleep apnea as a contributor to her sleep issues.  Assessment/Plan:  Snoring with excessive daytime sleepiness. - will need to arrange for a home sleep study  Obesity. - discussed how weight can impact sleep and risk for sleep disordered breathing - discussed options to assist with weight loss: combination of diet modification, cardiovascular and strength training exercises  Cardiovascular risk. - had an extensive discussion regarding the adverse health consequences related to untreated sleep disordered breathing - specifically discussed the risks for hypertension, coronary artery disease, cardiac dysrhythmias, cerebrovascular disease, and diabetes - lifestyle modification discussed  Safe driving practices. - discussed how sleep disruption can increase risk of accidents, particularly when driving - safe driving practices were discussed  Therapies for obstructive sleep apnea. - if the sleep study shows significant sleep  apnea, then various therapies for treatment were reviewed: CPAP, oral appliance, and surgical interventions  Insomnia. - her difficulties falling asleep could be related to sleep apnea - continue melatonin and xanax for now - reassess after her sleep study is done   Patient Instructions  Will arrange for home sleep study Will call to arrange for follow up after sleep study reviewed     Chesley Mires, MD [place] 09/06/2017, 9:36 AM  Flow Sheet  Pulmonary tests: Spirometry 06/09/17 >> 2.63 (104%), FEV1% 90  Sleep tests:  Review of Systems: All negative  Past Medical History: She  has a past medical history of Anxiety, Fluid retention, GERD (gastroesophageal reflux disease), Heart murmur, Hypertension, Migraines, PID (pelvic inflammatory disease), Recurrent upper respiratory infection (URI), Thyroid disease, and Urticaria.  Past Surgical History: She  has a past surgical history that includes tummy tuck (2005); Breast surgery (2005); Carpal tunnel release (Bilateral); Tubal ligation (1989); leg lift; Augmentation mammaplasty (Bilateral, 2005); Tonsillectomy (1972); and Cosmetic surgery.  Family History: Her family history includes Allergic rhinitis in her mother; Colon polyps in her mother; Crohn's disease in her cousin; Diabetes in her maternal grandfather, maternal grandmother, and mother; Hyperlipidemia in her father; Pancreatic cancer in her father; Stroke in her maternal grandmother and paternal grandmother; Uterine cancer in her mother.  Social History: She  reports that she has never smoked. She has never used smokeless tobacco. She reports that she drinks alcohol. She reports that she does not use drugs.  Medications: Allergies as of 09/06/2017   No Known Allergies     Medication List        Accurate as of 09/06/17  9:36 AM. Always use your most recent med list.          albuterol 108 (90 Base) MCG/ACT inhaler Commonly known as:  PROVENTIL  HFA;VENTOLIN HFA Inhale 1-2 puffs into the lungs every 6 (six) hours as needed for wheezing or shortness of breath.   ALPRAZolam 0.5 MG tablet Commonly known as:  XANAX TAKE 1 TABLET BY MOUTH AT BEDTIME AS NEEDED   budesonide-formoterol 160-4.5 MCG/ACT inhaler Commonly known as:  SYMBICORT Inhale 2 puffs into the lungs 2 (two) times daily.   buPROPion 150 MG 24 hr tablet Commonly known as:  WELLBUTRIN XL TAKE 1 TABLET(150 MG) BY MOUTH DAILY   glucosamine-chondroitin 500-400 MG tablet Take 1 tablet by mouth daily.   glycopyrrolate 2 MG tablet Commonly known as:  ROBINUL take 1 tablet by mouth twice a day   levothyroxine 137 MCG tablet Commonly known as:  SYNTHROID, LEVOTHROID TAKE 1 TABLET BY MOUTH 30 MINUTES BEFORE BREAKFAST. DO NOT LAY DOWN FOR THE NEXT 30 MINUTES   montelukast 10 MG tablet Commonly known as:  SINGULAIR TAKE 1 TABLET(10 MG) BY MOUTH AT BEDTIME   multivitamin-prenatal 27-0.8 MG Tabs tablet Take 1 tablet by mouth daily at 12 noon. OTC prenatal   PANAX GINSENG PO Take 600 mg by mouth daily.   PROBIOTIC DAILY PO Take by mouth.   rosuvastatin 5 MG tablet Commonly known as:  CRESTOR One po 3 times a week   sertraline 100 MG tablet Commonly known as:  ZOLOFT Take 1 tablet (100 mg total) by mouth daily.   triamterene-hydrochlorothiazide 75-50 MG tablet Commonly known as:  MAXZIDE TAKE 1 TABLET BY MOUTH ONCE DAILY   TURMERIC PO Take 1 tablet daily by mouth.

## 2017-09-06 NOTE — Patient Instructions (Signed)
Will arrange for home sleep study Will call to arrange for follow up after sleep study reviewed  

## 2017-09-13 ENCOUNTER — Other Ambulatory Visit: Payer: Self-pay | Admitting: Gastroenterology

## 2017-09-13 ENCOUNTER — Other Ambulatory Visit: Payer: Self-pay | Admitting: Internal Medicine

## 2017-09-13 NOTE — Progress Notes (Signed)
52 y.o. G73P2002 Married Caucasian F here for annual exam. She has a h/o depression and anxiety, was on Celexa and Wellbutrin was added this summer which helped.    She hasn't had any vaginal bleeding for 10 years, since mirena was placed. 2nd mirena removed last fall.  FSH was postmenopausal.  No vasomotor symptoms or vaginal dryness. She can have [place] with a full bladder and she has a deep cough, small amount. Infrequent and tolerable.  She has intermittent constipation to loose stools. She knows how to manage it. Sexually active, no pain.     No LMP recorded. Patient is perimenopausal.          Sexually active: Yes.    The current method of family planning is BTL.    Exercising: Yes.    walkling Smoker:  no  Health Maintenance: Pap:  01/30/2016 normal History of abnormal Pap:  no MMG:  05/17/2017 BI-RADS CATEGORY  1:  Negative. Colonoscopy:  2018- History of polyp, follow up in 5 years per Dr Ardis Hughs at [facility] BMD:   never TDaP:  12/10/2016 Gardasil: n/a   reports that she has never smoked. She has never used smokeless tobacco. She reports that she drinks alcohol. She reports that she does not use drugs.  Past Medical History:  Diagnosis Date  . Anxiety   . Fluid retention   . GERD (gastroesophageal reflux disease)   . Heart murmur    infant  . Hypertension   . Migraines   . PID (pelvic inflammatory disease)   . Recurrent upper respiratory infection (URI)    April 2019  . Thyroid disease   . Urticaria     Past Surgical History:  Procedure Laterality Date  . AUGMENTATION MAMMAPLASTY Bilateral 2005  . BREAST SURGERY  2005   Augmentation  . CARPAL TUNNEL RELEASE Bilateral    R in '07 and L '06  . COSMETIC SURGERY    . leg lift    . TONSILLECTOMY  1972  . TUBAL LIGATION  1989  . tummy tuck  2005    Current Outpatient Medications  Medication Sig Dispense Refill  . albuterol (PROVENTIL HFA;VENTOLIN HFA) 108 (90 Base) MCG/ACT inhaler Inhale 1-2 puffs into the  lungs every 6 (six) hours as needed for wheezing or shortness of breath. 1 Inhaler prn  . ALPRAZolam (XANAX) 0.5 MG tablet TAKE 1 TABLET BY MOUTH AT BEDTIME AS NEEDED 30 tablet 5  . budesonide-formoterol (SYMBICORT) 160-4.5 MCG/ACT inhaler Inhale 2 puffs into the lungs 2 (two) times daily. 1 Inhaler 3  . buPROPion (WELLBUTRIN XL) 150 MG 24 hr tablet TAKE 1 TABLET(150 MG) BY MOUTH DAILY 90 tablet 3  . glucosamine-chondroitin 500-400 MG tablet Take 1 tablet by mouth daily.    Marland Kitchen glycopyrrolate (ROBINUL) 2 MG tablet TAKE 1 TABLET BY MOUTH TWICE A DAY 60 tablet 0  . levothyroxine (SYNTHROID, LEVOTHROID) 137 MCG tablet TAKE 1 TABLET BY MOUTH 30 MINUTES BEFORE BREAKFAST. DO NOT LAY DOWN FOR THE NEXT 30 MINUTES 90 tablet 0  . montelukast (SINGULAIR) 10 MG tablet TAKE 1 TABLET(10 MG) BY MOUTH AT BEDTIME (Patient taking differently: TAKE 1 TABLET(10 MG) BY MOUTH) 90 tablet 3  . PANAX GINSENG PO Take 600 mg by mouth daily.    . Prenatal Vit-Fe Fumarate-FA (MULTIVITAMIN-PRENATAL) 27-0.8 MG TABS tablet Take 1 tablet by mouth daily at 12 noon. OTC prenatal    . Probiotic Product (PROBIOTIC DAILY PO) Take by mouth.    . rosuvastatin (CRESTOR) 5 MG tablet  One po 3 times a week 36 tablet 3  . sertraline (ZOLOFT) 100 MG tablet Take 1 tablet (100 mg total) by mouth daily. 90 tablet 3  . triamterene-hydrochlorothiazide (MAXZIDE) 75-50 MG tablet TAKE 1 TABLET BY MOUTH ONCE DAILY 90 tablet 0  . TURMERIC PO Take 1 tablet daily by mouth.     No current facility-administered medications for this visit.     Family History  Problem Relation Age of Onset  . Diabetes Mother   . Uterine cancer Mother   . Colon polyps Mother   . Allergic rhinitis Mother   . Pancreatic cancer Father   . Hyperlipidemia Father   . Crohn's disease Cousin   . Diabetes Maternal Grandmother   . Stroke Maternal Grandmother   . Diabetes Maternal Grandfather   . Stroke Paternal Grandmother   . Colon cancer Neg Hx   . Stomach cancer Neg Hx    . Esophageal cancer Neg Hx   . Rectal cancer Neg Hx   . Liver cancer Neg Hx   . Eczema Neg Hx   . Urticaria Neg Hx   . Asthma Neg Hx     Review of Systems  Constitutional: Positive for unexpected weight change.       Weight gain    HENT: Negative.   Eyes: Negative.   Respiratory: Negative.   Cardiovascular: Negative.   Gastrointestinal: Negative.   Endocrine: Negative.   Genitourinary:       Loss of urine with cough or sneeze   Musculoskeletal: Positive for myalgias.  Skin: Negative.   Allergic/Immunologic: Negative.   Neurological: Positive for headaches.  Hematological: Bruises/bleeds easily.  Psychiatric/Behavioral: Negative.   All other systems reviewed and are negative.   Exam:   BP 122/86 (BP Location: Left Arm, Patient Position: Sitting, Cuff Size: Large)   Pulse 74   Resp 14   Ht 5\' 2"  (1.575 m)   Wt 198 lb 1.6 oz (89.9 kg)   BMI 36.23 kg/m   Weight change: @WEIGHTCHANGE @ Height:   Height: 5\' 2"  (157.5 cm)  Ht Readings from Last 3 Encounters:  09/14/17 5\' 2"  (1.575 m)  09/06/17 5\' 2"  (1.575 m)  07/13/17 5\' 2"  (1.575 m)    General appearance: alert, cooperative and appears stated age Head: Normocephalic, without obvious abnormality, atraumatic Neck: no adenopathy, supple, symmetrical, trachea midline and thyroid normal to inspection and palpation Lungs: clear to auscultation bilaterally Cardiovascular: regular rate and rhythm Breasts: normal appearance, no masses or tenderness. Bilateral breast implants Abdomen: soft, non-tender; non distended,  no masses,  no organomegaly.  Extremities: extremities normal, atraumatic, no cyanosis or edema Skin: Skin color, texture, turgor normal. No rashes or lesions Lymph nodes: Cervical, supraclavicular, and axillary nodes normal. No abnormal inguinal nodes palpated Neurologic: Grossly normal   Pelvic: External genitalia:  no lesions              Urethra:  normal appearing urethra with no masses, tenderness or  lesions              Bartholins and Skenes: normal                 Vagina: normal appearing vagina with normal color and discharge, no lesions              Cervix: no lesions               Bimanual Exam:  Uterus:  normal size, contour, position, consistency, mobility, non-tender  Adnexa: no mass, fullness, tenderness               Rectovaginal: Confirms               Anus:  normal sphincter tone, no lesions  Chaperone was present for exam.  A:  Well Woman with normal exam  PMP  Depression/anxiety helped on medication  P:   No pap this year  Mammogram and colonoscopy are UTD  Discussed breast self exam  Discussed calcium and vit D intake  Continue Wellbutrin (on Celexa as well with her primary MD)  Labs with primary

## 2017-09-14 ENCOUNTER — Ambulatory Visit: Payer: 59 | Admitting: Obstetrics and Gynecology

## 2017-09-14 ENCOUNTER — Other Ambulatory Visit: Payer: Self-pay

## 2017-09-14 ENCOUNTER — Encounter: Payer: Self-pay | Admitting: Obstetrics and Gynecology

## 2017-09-14 VITALS — BP 122/86 | HR 74 | Resp 14 | Ht 62.0 in | Wt 198.1 lb

## 2017-09-14 DIAGNOSIS — Z01419 Encounter for gynecological examination (general) (routine) without abnormal findings: Secondary | ICD-10-CM

## 2017-09-14 NOTE — Patient Instructions (Signed)
EXERCISE AND DIET:  We recommended that you start or continue a regular exercise program for good health. Regular exercise means any activity that makes your heart beat faster and makes you sweat.  We recommend exercising at least 30 minutes per day at least 3 days a week, preferably 4 or 5.  We also recommend a diet low in fat and sugar.  Inactivity, poor dietary choices and obesity can cause diabetes, heart attack, stroke, and kidney damage, among others.    ALCOHOL AND SMOKING:  Women should limit their alcohol intake to no more than 7 drinks/beers/glasses of wine (combined, not each!) per week. Moderation of alcohol intake to this level decreases your risk of breast cancer and liver damage. And of course, no recreational drugs are part of a healthy lifestyle.  And absolutely no smoking or even second hand smoke. Most people know smoking can cause heart and lung diseases, but did you know it also contributes to weakening of your bones? Aging of your skin?  Yellowing of your teeth and nails?  CALCIUM AND VITAMIN D:  Adequate intake of calcium and Vitamin D are recommended.  The recommendations for exact amounts of these supplements seem to change often, but generally speaking 1,200 mg of calcium (either carbonate or citrate) and 800 units of Vitamin D per day seems prudent. Certain women may benefit from higher intake of Vitamin D.  If you are among these women, your doctor will have told you during your visit.    PAP SMEARS:  Pap smears, to check for cervical cancer or precancers,  have traditionally been done yearly, although recent scientific advances have shown that most women can have pap smears less often.  However, every woman still should have a physical exam from her gynecologist every year. It will include a breast check, inspection of the vulva and vagina to check for abnormal growths or skin changes, a visual exam of the cervix, and then an exam to evaluate the size and shape of the uterus and  ovaries.  And after 52 years of age, a rectal exam is indicated to check for rectal cancers. We will also provide age appropriate advice regarding health maintenance, like when you should have certain vaccines, screening for sexually transmitted diseases, bone density testing, colonoscopy, mammograms, etc.   MAMMOGRAMS:  All women over 40 years old should have a yearly mammogram. Many facilities now offer a "3D" mammogram, which may cost around $50 extra out of pocket. If possible,  we recommend you accept the option to have the 3D mammogram performed.  It both reduces the number of women who will be called back for extra views which then turn out to be normal, and it is better than the routine mammogram at detecting truly abnormal areas.    COLONOSCOPY:  Colonoscopy to screen for colon cancer is recommended for all women at age 50.  We know, you hate the idea of the prep.  We agree, BUT, having colon cancer and not knowing it is worse!!  Colon cancer so often starts as a polyp that can be seen and removed at colonscopy, which can quite literally save your life!  And if your first colonoscopy is normal and you have no family history of colon cancer, most women don't have to have it again for 10 years.  Once every ten years, you can do something that may end up saving your life, right?  We will be happy to help you get it scheduled when you are ready.    Be sure to check your insurance coverage so you understand how much it will cost.  It may be covered as a preventative service at no cost, but you should check your particular policy.      Breast Self-Awareness Breast self-awareness means being familiar with how your breasts look and feel. It involves checking your breasts regularly and reporting any changes to your health care provider. Practicing breast self-awareness is important. A change in your breasts can be a sign of a serious medical problem. Being familiar with how your breasts look and feel allows  you to find any problems early, when treatment is more likely to be successful. All women should practice breast self-awareness, including women who have had breast implants. How to do a breast self-exam One way to learn what is normal for your breasts and whether your breasts are changing is to do a breast self-exam. To do a breast self-exam: Look for Changes  1. Remove all the clothing above your waist. 2. Stand in front of a mirror in a room with good lighting. 3. Put your hands on your hips. 4. Push your hands firmly downward. 5. Compare your breasts in the mirror. Look for differences between them (asymmetry), such as: ? Differences in shape. ? Differences in size. ? Puckers, dips, and bumps in one breast and not the other. 6. Look at each breast for changes in your skin, such as: ? Redness. ? Scaly areas. 7. Look for changes in your nipples, such as: ? Discharge. ? Bleeding. ? Dimpling. ? Redness. ? A change in position. Feel for Changes  Carefully feel your breasts for lumps and changes. It is best to do this while lying on your back on the floor and again while sitting or standing in the shower or tub with soapy water on your skin. Feel each breast in the following way:  Place the arm on the side of the breast you are examining above your head.  Feel your breast with the other hand.  Start in the nipple area and make  inch (2 cm) overlapping circles to feel your breast. Use the pads of your three middle fingers to do this. Apply light pressure, then medium pressure, then firm pressure. The light pressure will allow you to feel the tissue closest to the skin. The medium pressure will allow you to feel the tissue that is a little deeper. The firm pressure will allow you to feel the tissue close to the ribs.  Continue the overlapping circles, moving downward over the breast until you feel your ribs below your breast.  Move one finger-width toward the center of the body.  Continue to use the  inch (2 cm) overlapping circles to feel your breast as you move slowly up toward your collarbone.  Continue the up and down exam using all three pressures until you reach your armpit.  Write Down What You Find  Write down what is normal for each breast and any changes that you find. Keep a written record with breast changes or normal findings for each breast. By writing this information down, you do not need to depend only on memory for size, tenderness, or location. Write down where you are in your menstrual cycle, if you are still menstruating. If you are having trouble noticing differences in your breasts, do not get discouraged. With time you will become more familiar with the variations in your breasts and more comfortable with the exam. How often should I examine my breasts? Examine   your breasts every month. If you are breastfeeding, the best time to examine your breasts is after a feeding or after using a breast pump. If you menstruate, the best time to examine your breasts is 5-7 days after your period is over. During your period, your breasts are lumpier, and it may be more difficult to notice changes. When should I see my health care provider? See your health care provider if you notice:  A change in shape or size of your breasts or nipples.  A change in the skin of your breast or nipples, such as a reddened or scaly area.  Unusual discharge from your nipples.  A lump or thick area that was not there before.  Pain in your breasts.  Anything that concerns you.  This information is not intended to replace advice given to you by your health care provider. Make sure you discuss any questions you have with your health care provider. Document Released: 01/04/2005 Document Revised: 06/12/2015 Document Reviewed: 11/24/2014 Elsevier Interactive Patient Education  2018 [employer]  

## 2017-09-28 DIAGNOSIS — G4733 Obstructive sleep apnea (adult) (pediatric): Secondary | ICD-10-CM

## 2017-09-30 ENCOUNTER — Other Ambulatory Visit: Payer: Self-pay | Admitting: *Deleted

## 2017-09-30 DIAGNOSIS — R0683 Snoring: Secondary | ICD-10-CM

## 2017-10-04 ENCOUNTER — Telehealth: Payer: Self-pay | Admitting: Pulmonary Disease

## 2017-10-04 ENCOUNTER — Encounter: Payer: Self-pay | Admitting: Pulmonary Disease

## 2017-10-04 DIAGNOSIS — G4733 Obstructive sleep apnea (adult) (pediatric): Secondary | ICD-10-CM

## 2017-10-04 HISTORY — DX: Obstructive sleep apnea (adult) (pediatric): G47.33

## 2017-10-04 NOTE — Telephone Encounter (Signed)
HST 09/28/17 >> AHI 15.4, SpO2 low 75%   Please inform her that her sleep study shows moderate obstructive sleep apnea.  Please arrange for ROV with me or NP to discuss treatment options.

## 2017-10-07 ENCOUNTER — Encounter: Payer: Self-pay | Admitting: Pulmonary Disease

## 2017-10-07 ENCOUNTER — Ambulatory Visit: Payer: 59 | Admitting: Pulmonary Disease

## 2017-10-07 VITALS — BP 136/86 | HR 108 | Ht 62.0 in | Wt 199.4 lb

## 2017-10-07 DIAGNOSIS — Z23 Encounter for immunization: Secondary | ICD-10-CM

## 2017-10-07 DIAGNOSIS — G4730 Sleep apnea, unspecified: Secondary | ICD-10-CM

## 2017-10-07 DIAGNOSIS — Z7189 Other specified counseling: Secondary | ICD-10-CM | POA: Diagnosis not present

## 2017-10-07 DIAGNOSIS — E669 Obesity, unspecified: Secondary | ICD-10-CM

## 2017-10-07 DIAGNOSIS — G4700 Insomnia, unspecified: Secondary | ICD-10-CM

## 2017-10-07 DIAGNOSIS — G4733 Obstructive sleep apnea (adult) (pediatric): Secondary | ICD-10-CM

## 2017-10-07 NOTE — Telephone Encounter (Signed)
Pt returning phone call. Contact number [phone]

## 2017-10-07 NOTE — Patient Instructions (Signed)
Will arrange for auto CPAP set up  Flu shot today  Follow up in 2 months after getting CPAP

## 2017-10-07 NOTE — Progress Notes (Signed)
[facility]  Chief Complaint  Patient presents with  . Follow-up    Pt here to discuss HST results. Pt would like flu shot today.    Constitutional: BP 136/86 (BP Location: Left Arm, Cuff Size: Normal)   Pulse (!) 108   Ht 5\' 2"  (1.575 m)   Wt 199 lb 6.4 oz (90.4 kg)   SpO2 99%   BMI 36.47 kg/m   History of Present Illness: Rebecca Mccoy is a 52 y.o. female with difficulty falling asleep.  She had home sleep study. Moderate sleep apnea.   She is planning an exercise regimen.  She has been doing better with her diet.  Comprehensive Respiratory Exam:  Appearance - well kempt  ENMT - nasal mucosa moist, turbinates clear, midline nasal septum, no dental lesions, no gingival bleeding, no oral exudates, no tonsillar hypertrophy, MP 4, enlarged tongue, low laying soft palate Neck - no masses, trachea midline, no thyromegaly, no elevation in JVP Respiratory - normal appearance of chest wall, normal respiratory effort w/o accessory muscle use, no dullness on percussion, no tactile fremitus, no wheezing or rales CV - s1s2 regular rate and rhythm, no murmurs, no peripheral edema, no varicosities, radial pulses symmetric GI - soft, non tender, no masses, no hepatosplenomegaly Lymph - no adenopathy noted in neck and axillary areas MSK - normal muscle strength and tone, normal gait Ext - no cyanosis, clubbing, or joint inflammation noted Skin - no rashes, lesions, or ulcers Neuro - oriented to person, place, and time Psych - normal mood and affect   Assessment/Plan:  Obstructive sleep apnea. - reviewed treatment options - will arrange for auto CPAP set up - discussed options to assist with mask fit  Obesity with sleep apnea. - discussed exercise regimen and diet modification  Insomnia with obstructive sleep apnea. - will reassess this after she is established on therapy for sleep apnea - can continue prn melatonin and xanax for  now   Patient Instructions  Will arrange for auto CPAP set up  Flu shot today  Follow up in 2 months after getting CPAP    Chesley Mires, MD [place] 10/07/2017, 12:37 PM  Flow Sheet  Pulmonary tests: Spirometry 06/09/17 >> 2.63 (104%), FEV1% 90  Sleep tests: HST 09/28/17 >> AHI 15.4, SpO2 low 75%  Past Medical History: She  has a past medical history of Anxiety, Fluid retention, GERD (gastroesophageal reflux disease), Heart murmur, Hypertension, Migraines, OSA (obstructive sleep apnea) (10/04/2017), PID (pelvic inflammatory disease), Recurrent upper respiratory infection (URI), Thyroid disease, and Urticaria.  Past Surgical History: She  has a past surgical history that includes tummy tuck (2005); Breast surgery (2005); Carpal tunnel release (Bilateral); Tubal ligation (1989); leg lift; Augmentation mammaplasty (Bilateral, 2005); Tonsillectomy (1972); and Cosmetic surgery.  Family History: Her family history includes Allergic rhinitis in her mother; Colon polyps in her mother; Crohn's disease in her cousin; Diabetes in her maternal grandfather, maternal grandmother, and mother; Hyperlipidemia in her father; Pancreatic cancer in her father; Stroke in her maternal grandmother and paternal grandmother; Uterine cancer in her mother.  Social History: She  reports that she has never smoked. She has never used smokeless tobacco. She reports that she drinks alcohol. She reports that she does not use drugs.  Medications: Allergies as of 10/07/2017   No Known Allergies     Medication List        Accurate as of 10/07/17 12:37 PM. Always use your most recent med list.  albuterol 108 (90 Base) MCG/ACT inhaler Commonly known as:  PROVENTIL HFA;VENTOLIN HFA Inhale 1-2 puffs into the lungs every 6 (six) hours as needed for wheezing or shortness of breath.   ALPRAZolam 0.5 MG tablet Commonly known as:  XANAX TAKE 1 TABLET BY MOUTH AT BEDTIME AS NEEDED    budesonide-formoterol 160-4.5 MCG/ACT inhaler Commonly known as:  SYMBICORT Inhale 2 puffs into the lungs 2 (two) times daily.   buPROPion 150 MG 24 hr tablet Commonly known as:  WELLBUTRIN XL TAKE 1 TABLET(150 MG) BY MOUTH DAILY   glucosamine-chondroitin 500-400 MG tablet Take 1 tablet by mouth daily.   glycopyrrolate 2 MG tablet Commonly known as:  ROBINUL TAKE 1 TABLET BY MOUTH TWICE A DAY   levothyroxine 137 MCG tablet Commonly known as:  SYNTHROID, LEVOTHROID TAKE 1 TABLET BY MOUTH 30 MINUTES BEFORE BREAKFAST. DO NOT LAY DOWN FOR THE NEXT 30 MINUTES   montelukast 10 MG tablet Commonly known as:  SINGULAIR TAKE 1 TABLET(10 MG) BY MOUTH AT BEDTIME   multivitamin-prenatal 27-0.8 MG Tabs tablet Take 1 tablet by mouth daily at 12 noon. OTC prenatal   PANAX GINSENG PO Take 600 mg by mouth daily.   PROBIOTIC DAILY PO Take by mouth.   rosuvastatin 5 MG tablet Commonly known as:  CRESTOR One po 3 times a week   sertraline 100 MG tablet Commonly known as:  ZOLOFT Take 1 tablet (100 mg total) by mouth daily.   triamterene-hydrochlorothiazide 75-50 MG tablet Commonly known as:  MAXZIDE TAKE 1 TABLET BY MOUTH ONCE DAILY   TURMERIC PO Take 1 tablet daily by mouth.

## 2017-10-07 NOTE — Telephone Encounter (Signed)
ATC pt, no answer. Left message for pt to call back.  

## 2017-10-07 NOTE — Addendum Note (Signed)
Addended by: Elton Sin on: 10/07/2017 12:41 PM   Modules accepted: Orders

## 2017-10-07 NOTE — Telephone Encounter (Signed)
Pt is aware of results and is able to be seen today at 12:00pm. Nothing more needed at this time.

## 2017-10-10 ENCOUNTER — Other Ambulatory Visit: Payer: Self-pay | Admitting: Gastroenterology

## 2017-10-18 DIAGNOSIS — G4733 Obstructive sleep apnea (adult) (pediatric): Secondary | ICD-10-CM | POA: Diagnosis not present

## 2017-10-20 ENCOUNTER — Encounter: Payer: Self-pay | Admitting: Internal Medicine

## 2017-10-20 ENCOUNTER — Ambulatory Visit: Payer: 59 | Admitting: Internal Medicine

## 2017-10-20 VITALS — BP 110/80 | HR 90 | Ht 62.0 in | Wt 198.0 lb

## 2017-10-20 DIAGNOSIS — F32A Depression, unspecified: Secondary | ICD-10-CM

## 2017-10-20 DIAGNOSIS — H6501 Acute serous otitis media, right ear: Secondary | ICD-10-CM

## 2017-10-20 DIAGNOSIS — F419 Anxiety disorder, unspecified: Secondary | ICD-10-CM

## 2017-10-20 DIAGNOSIS — G4709 Other insomnia: Secondary | ICD-10-CM

## 2017-10-20 DIAGNOSIS — H60501 Unspecified acute noninfective otitis externa, right ear: Secondary | ICD-10-CM

## 2017-10-20 DIAGNOSIS — K580 Irritable bowel syndrome with diarrhea: Secondary | ICD-10-CM | POA: Diagnosis not present

## 2017-10-20 DIAGNOSIS — F329 Major depressive disorder, single episode, unspecified: Secondary | ICD-10-CM

## 2017-10-20 MED ORDER — DOXYCYCLINE HYCLATE 100 MG PO TABS: 100.0000 mg | ORAL_TABLET | Freq: Two times a day (BID) | ORAL | 0 refills | Status: DC

## 2017-10-20 MED ORDER — FLUCONAZOLE 150 MG PO TABS: 150.0000 mg | ORAL_TABLET | Freq: Once | ORAL | 1 refills | Status: AC

## 2017-10-20 MED ORDER — NEOMYCIN-POLYMYXIN-HC 3.5-10000-1 OT SOLN: 4.0000 [drp] | Freq: Four times a day (QID) | OTIC | 0 refills | Status: DC

## 2017-11-07 ENCOUNTER — Other Ambulatory Visit: Payer: Self-pay | Admitting: Internal Medicine

## 2017-11-07 NOTE — Patient Instructions (Signed)
Take doxycycline 100 mg twice daily for 10 days.  Cortisporin otic suspension right external ear canal 4 times a day for 5 days.  Use inhalers as needed.  Continue antidepressants as prescribed Xanax sparingly for insomnia.

## 2017-11-07 NOTE — Progress Notes (Signed)
   Subjective:    Patient ID: Rebecca Mccoy, female    DOB: September 20, 1965, 52 y.o.   MRN: [id number]  HPI 52 year old Female in today with complaint of right ear pain and for discussion of medication management.  Continues to have issues with insomnia.  Does not want to take Xanax when traveling.  Was placed on Wellbutrin XL 150 mg in August by GYN physician.  Dr. Ardis Hughs gave her Robinul for irritable bowel syndrome in September.  She has Xanax to take sparingly at bedtime if she cannot turn her mind off.  She is on Zoloft 100 mg daily.  She has Zoloft and Symbicort inhalers for bronchospasm which she uses as needed.    Review of Systems no fever or shaking chills.  No sore throat.  Just right ear pain.     Objective:   Physical Exam Skin warm and dry.  Nodes none.  Neck is supple.  No adenopathy.  Left TM clear.  Right TM slightly full and slightly dull but not significantly red.  Slight erythema and tenderness right external ear canal chest clear to auscultation without rales or wheezing.  Affect is normal.       Assessment & Plan:  Right serous otitis media  Right otitis externa-use Cortisporin otic suspension 4 drops to right external ear canal 4 times a day for 5 days  History of bronchospasm-has Symbicort and albuterol inhalers  Anxiety-is on Zoloft and Wellbutrin  Insomnia-has Xanax to take sparingly  Irritable bowel syndrome treated by gastroenterologist with Robinul  Plan: Doxycycline 100 mg twice daily for 10 days.  Continue other medications as previously prescribed.  Cortisporin otic suspension in right external ear canal 4 times a day for 5 days.  No change in other medications but discussed these medications  25 minutes spent with patient

## 2017-11-18 DIAGNOSIS — G4733 Obstructive sleep apnea (adult) (pediatric): Secondary | ICD-10-CM | POA: Diagnosis not present

## 2017-12-01 ENCOUNTER — Other Ambulatory Visit: Payer: Self-pay | Admitting: Internal Medicine

## 2017-12-13 ENCOUNTER — Other Ambulatory Visit: Payer: Self-pay | Admitting: Internal Medicine

## 2017-12-18 DIAGNOSIS — G4733 Obstructive sleep apnea (adult) (pediatric): Secondary | ICD-10-CM | POA: Diagnosis not present

## 2017-12-19 ENCOUNTER — Ambulatory Visit: Payer: 59 | Admitting: Internal Medicine

## 2017-12-19 ENCOUNTER — Encounter: Payer: Self-pay | Admitting: Internal Medicine

## 2017-12-19 VITALS — BP 120/90 | HR 119 | Temp 98.0°F | Ht 62.0 in | Wt 205.0 lb

## 2017-12-19 DIAGNOSIS — J22 Unspecified acute lower respiratory infection: Secondary | ICD-10-CM

## 2017-12-19 MED ORDER — HYDROCODONE-HOMATROPINE 5-1.5 MG/5ML PO SYRP: 5.0000 mL | ORAL_SOLUTION | Freq: Three times a day (TID) | ORAL | 0 refills | Status: DC | PRN

## 2017-12-19 MED ORDER — DOXYCYCLINE HYCLATE 100 MG PO TABS: 100.0000 mg | ORAL_TABLET | Freq: Two times a day (BID) | ORAL | 0 refills | Status: DC

## 2017-12-19 NOTE — Progress Notes (Signed)
   Subjective:    Patient ID: Rebecca Mccoy, female    DOB: 14-Feb-1965, 52 y.o.   MRN: [id number]  HPI 52 year old Female for cough and congestion since Friday. No fever. No chills. Had flu vaccine. No sputum production. Has CPAP device now for OSA and sleeping better. Has come of of Zoloft and Xanax. Saw Dr. Ernst Bowler for reactive airways disease.    Review of Systems see above     Objective:   Physical Exam  Skin warm and dry.  Nodes none.  Pharynx very slightly injected.  TMs are clear.  Neck supple.  Chest clear.  She sounds nasally congested and is coughing.      Assessment & Plan:  Acute lower respiratory infection  Plan: Doxycycline 100 mg twice daily for 10 days.  Hycodan 1 teaspoon p.o. every 8 hours as needed cough

## 2018-01-03 ENCOUNTER — Ambulatory Visit: Payer: 59 | Admitting: Pulmonary Disease

## 2018-01-05 ENCOUNTER — Other Ambulatory Visit: Payer: Self-pay | Admitting: Internal Medicine

## 2018-01-18 DIAGNOSIS — G4733 Obstructive sleep apnea (adult) (pediatric): Secondary | ICD-10-CM | POA: Diagnosis not present

## 2018-01-23 ENCOUNTER — Encounter: Payer: Self-pay | Admitting: Pulmonary Disease

## 2018-01-23 ENCOUNTER — Ambulatory Visit: Payer: 59 | Admitting: Pulmonary Disease

## 2018-01-23 VITALS — BP 104/84 | HR 115 | Ht 62.0 in | Wt 205.0 lb

## 2018-01-23 DIAGNOSIS — G4730 Sleep apnea, unspecified: Secondary | ICD-10-CM | POA: Diagnosis not present

## 2018-01-23 DIAGNOSIS — G4733 Obstructive sleep apnea (adult) (pediatric): Secondary | ICD-10-CM

## 2018-01-23 DIAGNOSIS — G4700 Insomnia, unspecified: Secondary | ICD-10-CM | POA: Diagnosis not present

## 2018-01-23 DIAGNOSIS — E669 Obesity, unspecified: Secondary | ICD-10-CM | POA: Diagnosis not present

## 2018-01-23 NOTE — Patient Instructions (Signed)
Follow up in 1 year.

## 2018-01-23 NOTE — Progress Notes (Signed)
Pulmonary, Critical Care, and Sleep Medicine  Chief Complaint  Patient presents with  . Follow-up    wearing cpap avg 6hr nightly-feels pressure & nasal pillows are okay. JHE:[id number]    Constitutional:  BP 104/84 (BP Location: Left Arm, Cuff Size: Normal)   Pulse (!) 115   Ht 5\' 2"  (1.575 m)   Wt 205 lb (93 kg)   SpO2 95%   BMI 37.49 kg/m   Past Medical History:  Urticaria, PID, Migraines, HTN, GERD, Anxiety, Hypothyroidism, Asthma  Brief Summary:  Rebecca Mccoy is a 53 y.o. female with obstructive sleep apnea  She has been sleeping better since starting CPAP.  Uses nasal pillows.  Had trouble with allergies last month and was harder to use CPAP then.  Not having dry mouth, sore throat, or aerophagia.  Not using xanax anymore.  Still needs to use melatonin 10 mg, and Zquil when she has allergies.  Physical Exam:   Appearance - well kempt   ENMT - no sinus tenderness, no nasal discharge, no oral exudate  Neck - no masses, trachea midline, no thyromegaly, no elevation in JVP  Respiratory - normal appearance of chest wall, normal respiratory effort w/o accessory muscle use, no wheezing or rales  CV - s1s2 regular rate and rhythm, no murmurs, no peripheral edema, radial pulses symmetric  GI - soft, non tender, no masses  Lymph - no adenopathy noted in neck and axillary areas  MSK - normal gait  Ext - no cyanosis, clubbing, or joint inflammation noted  Skin - no rashes, lesions, or ulcers  Neuro - normal strength, oriented x 3  Psych - normal mood and affect    Assessment/Plan:   Obstructive sleep apnea. - she is compliant with CPAP and reports benefit - continue auto CPAP  Insomnia with obstructive sleep apnea. - continue melatonin - advised her to limit use of antihistamines for sleep unless she is also having trouble with her allergies  Obesity. - discussed importance of weight loss  Patient Instructions  Follow up in 1 year    Chesley Mires, MD [place] Pager: [phone] 01/23/2018, 3:36 PM  Flow Sheet     Pulmonary tests:  Spirometry 06/09/17 >> 2.63 (104%), FEV1% 90  Sleep tests:  HST 09/28/17 >> AHI 15.4, SpO2 low 75%  Medications:   Allergies as of 01/23/2018   No Known Allergies     Medication List       Accurate as of January 23, 2018  3:36 PM. Always use your most recent med list.        albuterol 108 (90 Base) MCG/ACT inhaler Commonly known as:  PROVENTIL HFA;VENTOLIN HFA Inhale 1-2 puffs into the lungs every 6 (six) hours as needed for wheezing or shortness of breath.   ALPRAZolam 0.5 MG tablet Commonly known as:  XANAX TAKE 1 TABLET BY MOUTH AT BEDTIME AS NEEDED   buPROPion 150 MG 24 hr tablet Commonly known as:  WELLBUTRIN XL TAKE 1 TABLET(150 MG) BY MOUTH DAILY   glucosamine-chondroitin 500-400 MG tablet Take 1 tablet by mouth daily.   glycopyrrolate 2 MG tablet Commonly known as:  ROBINUL TAKE 1 TABLET BY MOUTH TWICE A DAY   levothyroxine 137 MCG tablet Commonly known as:  SYNTHROID, LEVOTHROID TAKE 1 TABLET BY MOUTH DAILY 30 MINUTES BEFORE BREAKFAST; DO NOT LAY DOWN FOR THE NEXT 30 MINUTES   montelukast 10 MG tablet Commonly known as:  SINGULAIR TAKE 1 TABLET(10 MG) BY MOUTH AT BEDTIME  PANAX GINSENG PO Take 600 mg by mouth daily.   PROBIOTIC DAILY PO Take by mouth.   rosuvastatin 5 MG tablet Commonly known as:  CRESTOR One po 3 times a week   SYMBICORT 160-4.5 MCG/ACT inhaler Generic drug:  budesonide-formoterol INHALE 2 PUFFS INTO THE LUNGS TWICE DAILY   triamterene-hydrochlorothiazide 75-50 MG tablet Commonly known as:  MAXZIDE TAKE 1 TABLET BY MOUTH ONCE DAILY   TURMERIC PO Take 1 tablet daily by mouth.       Past Surgical History:  She  has a past surgical history that includes tummy tuck (2005); Breast surgery (2005); Carpal tunnel release (Bilateral); Tubal ligation (1989); leg lift; Augmentation mammaplasty (Bilateral, 2005);  Tonsillectomy (1972); and Cosmetic surgery.  Family History:  Her family history includes Allergic rhinitis in her mother; Colon polyps in her mother; Crohn's disease in her cousin; Diabetes in her maternal grandfather, maternal grandmother, and mother; Hyperlipidemia in her father; Pancreatic cancer in her father; Stroke in her maternal grandmother and paternal grandmother; Uterine cancer in her mother.  Social History:  She  reports that she has never smoked. She has never used smokeless tobacco. She reports current alcohol use. She reports that she does not use drugs.

## 2018-02-04 ENCOUNTER — Other Ambulatory Visit: Payer: Self-pay | Admitting: Internal Medicine

## 2018-02-17 ENCOUNTER — Other Ambulatory Visit: Payer: Self-pay | Admitting: Gastroenterology

## 2018-02-18 DIAGNOSIS — G4733 Obstructive sleep apnea (adult) (pediatric): Secondary | ICD-10-CM | POA: Diagnosis not present

## 2018-03-14 ENCOUNTER — Telehealth: Payer: Self-pay | Admitting: Internal Medicine

## 2018-03-14 DIAGNOSIS — Z7184 Encounter for health counseling related to travel: Secondary | ICD-10-CM

## 2018-03-14 MED ORDER — CIPROFLOXACIN HCL 500 MG PO TABS: 500.0000 mg | ORAL_TABLET | Freq: Two times a day (BID) | ORAL | 0 refills | Status: DC

## 2018-03-14 MED ORDER — ONDANSETRON HCL 4 MG PO TABS: 4.0000 mg | ORAL_TABLET | Freq: Three times a day (TID) | ORAL | 0 refills | Status: DC | PRN

## 2018-03-14 MED ORDER — FLUCONAZOLE 150 MG PO TABS: 150.0000 mg | ORAL_TABLET | Freq: Once | ORAL | 0 refills | Status: AC

## 2018-03-14 NOTE — Telephone Encounter (Signed)
Patient calling saying her and he husband are going out of the country to [place] this weekend and will be gone for 14 days and said in the past Dr Renold Genta would wright them a script for an antibiotic as a preventive and wanted to know if she would fill it again before the weekend

## 2018-03-14 NOTE — Telephone Encounter (Signed)
Call in [facility] [place] for travel as pt requested

## 2018-03-19 DIAGNOSIS — G4733 Obstructive sleep apnea (adult) (pediatric): Secondary | ICD-10-CM | POA: Diagnosis not present

## 2018-04-05 ENCOUNTER — Other Ambulatory Visit: Payer: Self-pay

## 2018-04-05 ENCOUNTER — Telehealth: Payer: Self-pay | Admitting: Internal Medicine

## 2018-04-05 MED ORDER — LEVOTHYROXINE SODIUM 137 MCG PO TABS: ORAL_TABLET | ORAL | 1 refills | Status: DC

## 2018-04-05 MED ORDER — LEVOTHYROXINE SODIUM 137 MCG PO TABS: ORAL_TABLET | ORAL | 5 refills | Status: DC

## 2018-04-05 NOTE — Telephone Encounter (Signed)
That's fine

## 2018-04-05 NOTE — Telephone Encounter (Signed)
Rebecca Mccoy [phone]  Rebecca Mccoy called to say that her nose is swollen inside and out, this started on Sunday afternoon and has gradually gotten worse. She did state that that had returned on Sunday from a 2 week cruise to [place]. She has no fever or any other symptoms. She did start the antibiotic yesterday, that you had prescribed for her while she was on trip. I did schedule an appointment for 04/06/18

## 2018-04-06 ENCOUNTER — Ambulatory Visit: Payer: 59 | Admitting: Internal Medicine

## 2018-04-06 ENCOUNTER — Other Ambulatory Visit: Payer: Self-pay

## 2018-04-06 ENCOUNTER — Encounter: Payer: Self-pay | Admitting: Internal Medicine

## 2018-04-06 VITALS — BP 140/80 | HR 102 | Temp 99.7°F | Ht 62.0 in | Wt 205.0 lb

## 2018-04-06 DIAGNOSIS — L0293 Carbuncle, unspecified: Secondary | ICD-10-CM

## 2018-04-06 MED ORDER — MUPIROCIN 2 % EX OINT: 1.0000 "application " | TOPICAL_OINTMENT | Freq: Two times a day (BID) | CUTANEOUS | 1 refills | Status: DC

## 2018-04-06 MED ORDER — DOXYCYCLINE HYCLATE 100 MG PO TABS: 100.0000 mg | ORAL_TABLET | Freq: Two times a day (BID) | ORAL | 0 refills | Status: DC

## 2018-04-06 NOTE — Progress Notes (Signed)
   Subjective:    Patient ID: Rebecca Mccoy, female    DOB: 25-Jul-1965, 53 y.o.   MRN: [id number]  HPI Just returned from trip to [place].  Was on a cruise.  Was in [place] airport standing in line recently.  While on trip, developed a bump in her nose and some maxillary sinus pressure.  She has a history of MRSA.  Husband has history of MRSA and he has a couple carbuncles on his abdomen she says.  She has not had fever chills or myalgias.  No sore throat.    Review of Systems see above     Objective:   Physical Exam Carbuncle right nostril without drainage.  Sounds nasally congested.       Assessment & Plan:   Foreign travel  Carbuncle right nostril  History of MRSA  Plan: Doxycycline 100 mg twice daily for 10 days.  Apply Bactroban to both nostrils daily for 5 to 7 days.  No symptoms of  Covid-19 infection at this time

## 2018-04-06 NOTE — Patient Instructions (Signed)
Doxycycline 100 mg twice daily for 10 days.  Bactroban to nostrils once daily.

## 2018-04-19 DIAGNOSIS — G4733 Obstructive sleep apnea (adult) (pediatric): Secondary | ICD-10-CM | POA: Diagnosis not present

## 2018-05-05 ENCOUNTER — Other Ambulatory Visit: Payer: Self-pay | Admitting: Internal Medicine

## 2018-05-06 ENCOUNTER — Other Ambulatory Visit: Payer: Self-pay | Admitting: Internal Medicine

## 2018-05-19 DIAGNOSIS — G4733 Obstructive sleep apnea (adult) (pediatric): Secondary | ICD-10-CM | POA: Diagnosis not present

## 2018-05-30 ENCOUNTER — Other Ambulatory Visit: Payer: Self-pay | Admitting: Gastroenterology

## 2018-05-30 ENCOUNTER — Telehealth: Payer: Self-pay | Admitting: Internal Medicine

## 2018-05-30 DIAGNOSIS — B373 Candidiasis of vulva and vagina: Secondary | ICD-10-CM

## 2018-05-30 MED ORDER — FLUCONAZOLE 150 MG PO TABS: 150.0000 mg | ORAL_TABLET | Freq: Once | ORAL | 0 refills | Status: AC

## 2018-05-30 NOTE — Telephone Encounter (Signed)
Received Fax RX request from  [facility] [facility]  Medication - fluconazole (DIFLUCAN) 150 MG tablet   Last Refill - 2.25.20  Last OV - 04/06/18  Last CPE - 05/16/17

## 2018-05-30 NOTE — Telephone Encounter (Signed)
Patient called to request a prescription for diflucan she said she finished doxycycline about a week ago and she thinks this caused her a yeast infection.

## 2018-05-30 NOTE — Telephone Encounter (Signed)
Refill Diflucan once

## 2018-06-06 ENCOUNTER — Ambulatory Visit (INDEPENDENT_AMBULATORY_CARE_PROVIDER_SITE_OTHER): Payer: 59 | Admitting: Internal Medicine

## 2018-06-06 ENCOUNTER — Telehealth: Payer: Self-pay | Admitting: Internal Medicine

## 2018-06-06 DIAGNOSIS — J4520 Mild intermittent asthma, uncomplicated: Secondary | ICD-10-CM

## 2018-06-06 DIAGNOSIS — J22 Unspecified acute lower respiratory infection: Secondary | ICD-10-CM | POA: Diagnosis not present

## 2018-06-06 MED ORDER — HYDROCODONE-HOMATROPINE 5-1.5 MG/5ML PO SYRP: 5.0000 mL | ORAL_SOLUTION | Freq: Three times a day (TID) | ORAL | 0 refills | Status: DC | PRN

## 2018-06-06 MED ORDER — FLUCONAZOLE 150 MG PO TABS: 150.0000 mg | ORAL_TABLET | Freq: Once | ORAL | 1 refills | Status: AC

## 2018-06-06 MED ORDER — DOXYCYCLINE HYCLATE 100 MG PO TABS: 100.0000 mg | ORAL_TABLET | Freq: Two times a day (BID) | ORAL | 0 refills | Status: DC

## 2018-06-06 MED ORDER — PREDNISONE 10 MG PO TABS: ORAL_TABLET | ORAL | 0 refills | Status: DC

## 2018-06-06 NOTE — Telephone Encounter (Signed)
OK 4:45pm

## 2018-06-06 NOTE — Telephone Encounter (Signed)
Rebecca Mccoy [phone]  [name] called to say she has persistent cough, nausea, drainage, tightness in chest. She stated that on Saturday her inhalers stop helping with cough, she thinks she might have bronchitis, she first thought it might be her asthma. Would you like to do virtual visit?

## 2018-06-06 NOTE — Telephone Encounter (Signed)
Scheduled virtual visit °

## 2018-06-17 ENCOUNTER — Encounter: Payer: Self-pay | Admitting: Internal Medicine

## 2018-06-17 NOTE — Progress Notes (Signed)
   Subjective:    Patient ID: Rebecca Mccoy, female    DOB: Feb 15, 1965, 53 y.o.   MRN: [id number]  HPI 53 year old Female seen today by interactive audio and video telecommunications due to the coronavirus pandemic.  She is identified as Rebecca Mccoy, a patient in this practice using 2 identifiers.  She is agreeable to visit in this format.  Patient complaining of cough.  Slight sputum production.  Apparently developed a cough several days ago with some tightness in her chest.  Complains of nausea, postnasal drip, and that inhalers were not helping with her cough.  No recent travel history.  No fever or chills.  Has some malaise and fatigue.  She was seen on March 19 having developed a carbuncle in her nose and maxillary sinus pressure after a trip involving a cruise in [place].  She was treated with doxycycline.  At that time, was not complaining of fever chills or myalgias.  She sounded nasally congested.  Patient says she improved at that time.    Review of Systems see above     Objective:   Physical Exam  Patient says she does not have elevated temperature.  Seen on virtual visit.  Looks fatigued.      Assessment & Plan:  Acute lower respiratory infection  History of reactive airways disease  Plan: Continue to use albuterol and Symbicort inhalers.  Doxycycline 100 mg twice daily for 10 days.  Hycodan 1 teaspoon p.o. every 8 hours as needed cough.  Prednisone taper number 21 tablets 10 mg going from 60 mg to 0 mg over 7 days.  Continue Singulair.  Call if not improving in 48 hours.  20 minutes spent with virtual visit evaluating patient  including time spent spent with medical decision making and also time spent with electronic prescribing of medications.

## 2018-06-17 NOTE — Patient Instructions (Signed)
Doxycycline 100 mg twice daily for 10 days.  Take prednisone in tapering course going from 60 mg to 0 mg over 7 days.  Hycodan 1 teaspoon p.o. every 8 hours as needed cough.  Continue albuterol and Symbicort inhalers.  Continue Singulair.  Call if not improving in 48 hours.

## 2018-06-22 ENCOUNTER — Other Ambulatory Visit: Payer: 59 | Admitting: Internal Medicine

## 2018-06-22 DIAGNOSIS — I10 Essential (primary) hypertension: Secondary | ICD-10-CM

## 2018-06-22 DIAGNOSIS — E7800 Pure hypercholesterolemia, unspecified: Secondary | ICD-10-CM

## 2018-06-22 DIAGNOSIS — E039 Hypothyroidism, unspecified: Secondary | ICD-10-CM

## 2018-06-22 DIAGNOSIS — Z0000 Encounter for general adult medical examination without abnormal findings: Secondary | ICD-10-CM

## 2018-06-23 ENCOUNTER — Ambulatory Visit (INDEPENDENT_AMBULATORY_CARE_PROVIDER_SITE_OTHER): Payer: 59 | Admitting: Internal Medicine

## 2018-06-23 ENCOUNTER — Encounter: Payer: Self-pay | Admitting: Internal Medicine

## 2018-06-23 VITALS — BP 120/88 | HR 120 | Temp 99.3°F | Ht 62.0 in | Wt 193.0 lb

## 2018-06-23 DIAGNOSIS — Z0000 Encounter for general adult medical examination without abnormal findings: Secondary | ICD-10-CM

## 2018-06-23 DIAGNOSIS — Z8669 Personal history of other diseases of the nervous system and sense organs: Secondary | ICD-10-CM

## 2018-06-23 DIAGNOSIS — K580 Irritable bowel syndrome with diarrhea: Secondary | ICD-10-CM

## 2018-06-23 DIAGNOSIS — E039 Hypothyroidism, unspecified: Secondary | ICD-10-CM

## 2018-06-23 DIAGNOSIS — Z8614 Personal history of Methicillin resistant Staphylococcus aureus infection: Secondary | ICD-10-CM

## 2018-06-23 DIAGNOSIS — I10 Essential (primary) hypertension: Secondary | ICD-10-CM

## 2018-06-23 DIAGNOSIS — E785 Hyperlipidemia, unspecified: Secondary | ICD-10-CM

## 2018-06-23 DIAGNOSIS — Z87898 Personal history of other specified conditions: Secondary | ICD-10-CM

## 2018-06-23 DIAGNOSIS — Z6835 Body mass index (BMI) 35.0-35.9, adult: Secondary | ICD-10-CM

## 2018-06-23 DIAGNOSIS — R079 Chest pain, unspecified: Secondary | ICD-10-CM | POA: Diagnosis not present

## 2018-06-23 DIAGNOSIS — E1169 Type 2 diabetes mellitus with other specified complication: Secondary | ICD-10-CM

## 2018-06-23 DIAGNOSIS — G4733 Obstructive sleep apnea (adult) (pediatric): Secondary | ICD-10-CM

## 2018-06-23 LAB — LIPID PANEL
Cholesterol: 273 mg/dL — ABNORMAL HIGH (ref <200)
HDL: 76 mg/dL (ref >=50)
LDL Cholesterol (Calc): 166 mg/dL (calc) — ABNORMAL HIGH
Non-HDL Cholesterol (Calc): 197 mg/dL (calc) — ABNORMAL HIGH (ref <130)
Total CHOL/HDL Ratio: 3.6 (calc) (ref <5.0)
Triglycerides: 161 mg/dL — ABNORMAL HIGH (ref <150)

## 2018-06-23 LAB — VITAMIN D 25 HYDROXY (VIT D DEFICIENCY, FRACTURES): Vit D, 25-Hydroxy: 38 ng/mL (ref 30–100)

## 2018-06-23 LAB — POCT URINALYSIS DIPSTICK
Appearance: NEGATIVE
Bilirubin, UA: NEGATIVE
Blood, UA: NEGATIVE
Glucose, UA: NEGATIVE
Ketones, UA: NEGATIVE
Leukocytes, UA: NEGATIVE
Nitrite, UA: NEGATIVE
Odor: NEGATIVE
Protein, UA: NEGATIVE
Spec Grav, UA: 1.01 (ref 1.010–1.025)
Urobilinogen, UA: 0.2 E.U./dL
pH, UA: 6.5 (ref 5.0–8.0)

## 2018-06-23 LAB — COMPLETE METABOLIC PANEL WITH GFR
AG Ratio: 1.6 (calc) (ref 1.0–2.5)
ALT: 26 U/L (ref 6–29)
AST: 28 U/L (ref 10–35)
Albumin: 4.5 g/dL (ref 3.6–5.1)
Alkaline phosphatase (APISO): 75 U/L (ref 37–153)
BUN: 12 mg/dL (ref 7–25)
CO2: 28 mmol/L (ref 20–32)
Calcium: 10 mg/dL (ref 8.6–10.4)
Chloride: 99 mmol/L (ref 98–110)
Creat: 1.04 mg/dL (ref 0.50–1.05)
GFR, Est African American: 72 mL/min/{1.73_m2} (ref >=60)
GFR, Est Non African American: 62 mL/min/{1.73_m2} (ref >=60)
Globulin: 2.8 g/dL (calc) (ref 1.9–3.7)
Glucose, Bld: 88 mg/dL (ref 65–99)
Potassium: 3.6 mmol/L (ref 3.5–5.3)
Sodium: 139 mmol/L (ref 135–146)
Total Bilirubin: 0.9 mg/dL (ref 0.2–1.2)
Total Protein: 7.3 g/dL (ref 6.1–8.1)

## 2018-06-23 LAB — CBC WITH DIFFERENTIAL/PLATELET
Absolute Monocytes: 431 cells/uL (ref 200–950)
Basophils Absolute: 28 cells/uL (ref 0–200)
Basophils Relative: 0.5 %
Eosinophils Absolute: 123 cells/uL (ref 15–500)
Eosinophils Relative: 2.2 %
HCT: 39.3 % (ref 35.0–45.0)
Hemoglobin: 13.4 g/dL (ref 11.7–15.5)
Lymphs Abs: 1915 cells/uL (ref 850–3900)
MCH: 31.2 pg (ref 27.0–33.0)
MCHC: 34.1 g/dL (ref 32.0–36.0)
MCV: 91.6 fL (ref 80.0–100.0)
MPV: 9.5 fL (ref 7.5–12.5)
Monocytes Relative: 7.7 %
Neutro Abs: 3102 cells/uL (ref 1500–7800)
Neutrophils Relative %: 55.4 %
Platelets: 282 10*3/uL (ref 140–400)
RBC: 4.29 10*6/uL (ref 3.80–5.10)
RDW: 13.5 % (ref 11.0–15.0)
Total Lymphocyte: 34.2 %
WBC: 5.6 10*3/uL (ref 3.8–10.8)

## 2018-06-23 LAB — TSH: TSH: 0.06 mIU/L — ABNORMAL LOW

## 2018-06-23 MED ORDER — ROSUVASTATIN CALCIUM 5 MG PO TABS: 5.0000 mg | ORAL_TABLET | Freq: Every day | ORAL | 1 refills | Status: DC

## 2018-06-23 MED ORDER — ROSUVASTATIN CALCIUM 5 MG PO TABS: ORAL_TABLET | ORAL | 3 refills | Status: DC

## 2018-06-23 NOTE — Progress Notes (Signed)
Subjective:    Patient ID: Rebecca Mccoy, female    DOB: January 22, 1965, 53 y.o.   MRN: [id number]  HPI 53 year Female for health maintenance exam and evaluation of medical issues. Has had some recent SSCP. Needs referral to Cardiology. EKG shows sinus rhythm with no acute changes. 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 Not exertional chest pain. History of asthmatic bronchitis and has seen allergist.  Unable to tolerate Crestor 3 times a week.  She said it caused myalgias.  History of familial hyperlipidemia.  She is overweight.  She has hypothyroidism.  History of migraine headaches.  History of insomnia and anxiety but states she quit working full-time she does not have insomnia and anxiety.  Had colonoscopy December 2017.  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  History of recurrent MRSA infections.  History of obstructive sleep apnea  Past medical history: History of allergic rhinitis, GE reflux.  History of menorrhagia due to fibroids 2006.  History of bilateral carpal tunnel syndrome status post surgical release.  History of hypertension.  History: He formerly lived in [place] area 2020 in late 2015 as her previous husband that time was in the [employer].  She divorced her husband in 2011.  Adult daughter lives at [facility].  She has 1 granddaughter.  She retired from the [employer].  She reports 2 pregnancies and no miscarriages.  Does not smoke.  Social alcohol consumption.  Husband retired from city of [place].  She completed 2 years of college.  History of bacterial vaginosis and Candida vaginitis.  History of chondromalacia right knee in 2014.  History of cellulitis right thigh in July 2014.  Right distal phalanx second toe fracture.  She had exercise tolerance test 2018 for chest pain.  Had sclerotherapy for varicose veins in 2004.  IUD inserted in 2008.  Sebaceous cyst on her back in 2008.  History of syncope in 2008 referred for  echocardiogram and an event monitor.  No subsequent issues with syncope.  Tonsillectomy 1972.  Bilateral tubal ligation 1979.  Abdominoplasty 2008.  Breast augmentation 2010.  Right carpal tunnel release February 2010.  Left carpal tunnel release March 2011.  Mirena IUD placed 2013.  History of anxiety disorder [place] [facility] [place] [employer]  This is stable.  History of palpitations.  No known drug allergies.     Review of Systems  Constitutional: Negative.   HENT:       History of non-allergic rhinitis seen by allergist with negative indoor and outdoor panels.  Respiratory: Negative.   Cardiovascular: Positive for chest pain.  Genitourinary: Negative.   Neurological:       History of migraine headaches  Psychiatric/Behavioral:       History of anxiety and insomnia   episode of chest pain about a week ago and another one a few days ago.     Objective:   Physical Exam Vitals signs reviewed.  Constitutional:      Appearance: Normal appearance. She is obese.  HENT:     Head: Normocephalic and atraumatic.     Right Ear: Tympanic membrane normal.     Left Ear: Tympanic membrane normal.     Nose: Nose normal.     Mouth/Throat:     Mouth: Mucous membranes are moist.     Pharynx: Oropharynx is clear.  Eyes:     General: No scleral icterus.    Conjunctiva/sclera: Conjunctivae normal.     Pupils: Pupils are  equal, round, and reactive to light.  Neck:     Musculoskeletal: Neck supple. No neck rigidity.  Cardiovascular:     Rate and Rhythm: Normal rate and regular rhythm.     Pulses: Normal pulses.     Heart sounds: Normal heart sounds. No murmur.     Comments: Bilateral breast implants Pulmonary:     Effort: Pulmonary effort is normal. No respiratory distress.     Breath sounds: Normal breath sounds. No wheezing or rales.  Abdominal:     Palpations: Abdomen is soft. There is no mass.     Tenderness: There is no abdominal tenderness. There is no rebound.   Genitourinary:    Comments: Pap done 2018.  Bimanual normal. Musculoskeletal:     Right lower leg: No edema.     Left lower leg: No edema.  Lymphadenopathy:     Cervical: No cervical adenopathy.  Skin:    General: Skin is warm and dry.  Neurological:     General: No focal deficit present.     Mental Status: She is alert and oriented to person, place, and time.     Cranial Nerves: No cranial nerve deficit.     Sensory: No sensory deficit.     Coordination: Coordination normal.     Gait: Gait normal.     Deep Tendon Reflexes: Reflexes normal.  Psychiatric:        Mood and Affect: Mood normal.        Behavior: Behavior normal.        Thought Content: Thought content normal.           Assessment & Plan:  Recent onset of chest pain he will be referred to cardiology for evaluation  Hypothyroidism-TSH is low on current dose of thyroid replacement and dose will be adjusted to a lower dose  Hyperlipidemia-does no longer take Crestor.  Says it caused myalgias.  Has mixed hyperlipidemia.  Needs to work on diet and exercise.  Obesity-have mentioned Dr. Leafy Ro to her in the past  Nonallergic rhinitis-seen by allergist in allergy panels were negative  History of migraine headaches  Anxiety and insomnia have improved with retiring from her job  History of MRSA skin infections  History of sleep apnea  Plan: Needs to be considered for lipid clinic.  Cardiology referral made regarding complaint of chest pain.  Appears to be statin intolerant.  Needs recheck on hypothyroidism in 6 weeks.  We will lower her dose.  Consider Dr. Leafy Ro for management of obesity.

## 2018-06-28 ENCOUNTER — Other Ambulatory Visit: Payer: Self-pay | Admitting: Internal Medicine

## 2018-06-30 ENCOUNTER — Telehealth: Payer: Self-pay | Admitting: Internal Medicine

## 2018-06-30 MED ORDER — LEVOTHYROXINE SODIUM 112 MCG PO TABS: 112.0000 ug | ORAL_TABLET | Freq: Every day | ORAL | 1 refills | Status: DC

## 2018-06-30 NOTE — Telephone Encounter (Signed)
Please change to levothyroxine 0.112 mg daily and follow up in 6 weeks here with TSH and then OV

## 2018-06-30 NOTE — Telephone Encounter (Signed)
Rebecca Mccoy [phone]  [place] called to say that she was thinking after her CPE last Friday that you were going to lower her Synthroid since she was having some nausea.

## 2018-07-03 ENCOUNTER — Other Ambulatory Visit: Payer: Self-pay

## 2018-07-03 ENCOUNTER — Telehealth (INDEPENDENT_AMBULATORY_CARE_PROVIDER_SITE_OTHER): Payer: 59 | Admitting: Cardiology

## 2018-07-03 VITALS — HR 105 | Ht 62.0 in | Wt 188.4 lb

## 2018-07-03 DIAGNOSIS — R079 Chest pain, unspecified: Secondary | ICD-10-CM | POA: Diagnosis not present

## 2018-07-03 DIAGNOSIS — E039 Hypothyroidism, unspecified: Secondary | ICD-10-CM

## 2018-07-03 DIAGNOSIS — R0789 Other chest pain: Secondary | ICD-10-CM | POA: Insufficient documentation

## 2018-07-03 DIAGNOSIS — E785 Hyperlipidemia, unspecified: Secondary | ICD-10-CM

## 2018-07-03 HISTORY — DX: Other chest pain: R07.89

## 2018-07-03 HISTORY — DX: Hyperlipidemia, unspecified: E78.5

## 2018-07-03 NOTE — Progress Notes (Signed)
Virtual Visit via Video Note   This visit type was conducted due to national recommendations for restrictions regarding the COVID-19 Pandemic (e.g. social distancing) in an effort to limit this patient's exposure and mitigate transmission in our community.  Due to her co-morbid illnesses, this patient is at least at moderate risk for complications without adequate follow up.  This format is felt to be most appropriate for this patient at this time.  All issues noted in this document were discussed and addressed.  A limited physical exam was performed with this format.  Please refer to the patient's chart for her consent to telehealth for [facility].  Evaluation Performed:  Follow-up visit  This visit type was conducted due to national recommendations for restrictions regarding the COVID-19 Pandemic (e.g. social distancing).  This format is felt to be most appropriate for this patient at this time.  All issues noted in this document were discussed and addressed.  No physical exam was performed (except for noted visual exam findings with Video Visits).  Please refer to the patient's chart (MyChart message for video visits and phone note for telephone visits) for the patient's consent to telehealth for [facility].  Date:  07/03/2018  ID: Rebecca Mccoy, DOB [birth year], MRN [id number]   Patient Location: [place] [address]   Provider location:   [place] Office  PCP:  Elby Showers, MD  Cardiologist:  Jenne Campus, MD     Chief Complaint: I have chest pain  History of Present Illness:    Rebecca Mccoy is a 53 y.o. female  who presents via audio/video conferencing for a telehealth visit today.  With essential hypertension, dyslipidemia who was referred to [place] because of chest pain.  She described to episode of chest pain does deeply happen at rest she describes as heavy pressure-like sensation graded 5 in scale up to 10 no sweating no shortness of  breath associated with it.  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  She said this is a gradual progression of the problem with.  Of last few months. Risk factors include hypertension as well as dyslipidemia interestingly she had very high HDL. She noted also on apple watch that her heart rate can be very elevated that make her worry a lot I told her that [place] is not the best for assessing heart rate but still this is concerning she sometimes feel her heart speeding up.   The patient does not have symptoms concerning for COVID-19 infection (fever, chills, cough, or new SHORTNESS OF BREATH).    Prior CV studies:   The following studies were reviewed today:       Past Medical History:  Diagnosis Date  . Anxiety   . Fluid retention   . GERD (gastroesophageal reflux disease)   . Heart murmur    infant  . Hypertension   . Migraines   . OSA (obstructive sleep apnea) 10/04/2017  . PID (pelvic inflammatory disease)   . Recurrent upper respiratory infection (URI)    April 2019  . Thyroid disease   . Urticaria     Past Surgical History:  Procedure Laterality Date  . AUGMENTATION MAMMAPLASTY Bilateral 2005  . BREAST SURGERY  2005   Augmentation  . CARPAL TUNNEL RELEASE Bilateral    R in '  07 and L '06  . COSMETIC SURGERY    . leg lift    . TONSILLECTOMY  1972  . TUBAL LIGATION  1989  . tummy tuck  2005     Current Meds  Medication Sig  . albuterol (PROVENTIL HFA;VENTOLIN HFA) 108 (90 Base) MCG/ACT inhaler Inhale 1-2 puffs into the lungs every 6 (six) hours as needed for wheezing or shortness of breath.  . ALPRAZolam (XANAX) 0.5 MG tablet TAKE 1 TABLET BY MOUTH AT BEDTIME AS NEEDED  . buPROPion (WELLBUTRIN XL) 150 MG 24 hr tablet TAKE 1 TABLET(150 MG) BY MOUTH DAILY  . cetirizine (ZYRTEC) 10 MG chewable tablet Chew 10 mg by mouth  daily.  Marland Kitchen glucosamine-chondroitin 500-400 MG tablet Take 1 tablet by mouth daily.  Marland Kitchen glycopyrrolate (ROBINUL) 2 MG tablet TAKE 1 TABLET BY MOUTH TWICE DAILY  . levothyroxine (SYNTHROID) 112 MCG tablet Take 1 tablet (112 mcg total) by mouth daily.  Marland Kitchen PANAX GINSENG PO Take 600 mg by mouth daily.  . Probiotic Product (PROBIOTIC DAILY PO) Take by mouth.  . rosuvastatin (CRESTOR) 5 MG tablet Take 1 tablet (5 mg total) by mouth daily.  . SYMBICORT 160-4.5 MCG/ACT inhaler INHALE 2 PUFFS INTO THE LUNGS TWICE DAILY  . triamterene-hydrochlorothiazide (MAXZIDE) 75-50 MG tablet TAKE 1 TABLET BY MOUTH ONCE DAILY  . TURMERIC PO Take 1 tablet daily by mouth.      Family History: The patient's family history includes Allergic rhinitis in her mother; Colon polyps in her mother; Crohn's disease in her cousin; Diabetes in her maternal grandfather, maternal grandmother, and mother; Hyperlipidemia in her father; Pancreatic cancer in her father; Stroke in her maternal grandmother and paternal grandmother; Uterine cancer in her mother. There is no history of Colon cancer, Stomach cancer, Esophageal cancer, Rectal cancer, Liver cancer, Eczema, Urticaria, or Asthma.   ROS:   Please see the history of present illness.     All other systems reviewed and are negative.   Labs/Other Tests and Data Reviewed:     Recent Labs: 06/22/2018: ALT 26; BUN 12; Creat 1.04; Hemoglobin 13.4; Platelets 282; Potassium 3.6; Sodium 139; TSH 0.06  Recent Lipid Panel    Component Value Date/Time   CHOL 273 (H) 06/22/2018 1029   TRIG 161 (H) 06/22/2018 1029   HDL 76 06/22/2018 1029   CHOLHDL 3.6 06/22/2018 1029   VLDL 22 07/26/2016 0914   LDLCALC 166 (H) 06/22/2018 1029      Exam:    Vital Signs:  Pulse (!) 105   Ht 5\' 2"  (1.575 m)   Wt 188 lb 6.4 oz (85.5 kg)   BMI 34.46 kg/m     Wt Readings from Last 3 Encounters:  07/03/18 188 lb 6.4 oz (85.5 kg)  06/23/18 193 lb (87.5 kg)  04/06/18 205 lb (93 kg)     Well  nourished, well developed in no acute distress. Alert awake and x3 talking to me over the video link denies having a chest pain tightness squeezing pressure burning chest at the moment of my interview.  Diagnosis for this visit:   1. Chest pain, unspecified type   2. Atypical chest pain   3. Dyslipidemia   4. Hypothyroidism, unspecified type      ASSESSMENT & PLAN:    1.  Atypical chest pain.  I will schedule him to have a stress test.  We will schedule him for Lexiscan because of coronavirus restrictions.  I did discuss different modalities of assessing her coronary arteries which include CT  angiogram she prefers to have a stress test.  I asked her to start taking aspirin every single day  2.  Dyslipidemia we will continue with medications.  She got quite interestingly fasting lipid profile.  Her HDL is high at the same time her LDL is very elevated she was taking Crestor but meant that she stopped for many weeks now she is back on it and will recheck her fasting lipid profile next few weeks 3.  Hypothyroid is followed by internal medicine team 4.  Palpitations we will put 1 week 0 patch on  COVID-19 Education: The signs and symptoms of COVID-19 were discussed with the patient and how to seek care for testing (follow up with PCP or arrange E-visit).  The importance of social distancing was discussed today.  Patient Risk:   After full review of this patients clinical status, I feel that they are at least moderate risk at this time.  Time:   Today, I have spent 22 minutes with the patient with telehealth technology discussing pt health issues.  I spent 5 minutes reviewing her chart before the visit.  Visit was finished at 2:15 PM.    Medication Adjustments/Labs and Tests Ordered: Current medicines are reviewed at length with the patient today.  Concerns regarding medicines are outlined above.  Orders Placed This Encounter  Procedures  . MYOCARDIAL PERFUSION IMAGING  . LONG TERM  MONITOR (3-14 DAYS)   Medication changes: No orders of the defined types were placed in this encounter.    Disposition:  1 month   Signed, Park Liter, MD, [facility] 07/03/2018 12:18 PM    [place]

## 2018-07-03 NOTE — Patient Instructions (Addendum)
Medication Instructions:  Your physician recommends that you continue on your current medications as directed. Please refer to the Current Medication list given to you today.  If you need a refill on your cardiac medications before your next appointment, please call your pharmacy.   Lab work: None.  If you have labs (blood work) drawn today and your tests are completely normal, you will receive your results only by: Marland Kitchen MyChart Message (if you have MyChart) OR . A paper copy in the mail If you have any lab test that is abnormal or we need to change your treatment, we will call you to review the results.  Testing/Procedures: Your physician has requested that you have a lexiscan myoview. For further information please visit [email or web address]. Please follow instruction sheet, as given.     [facility] [address] [place], [place] [address] Phone:  [phone]  July 03, 2018    Rebecca Mccoy DOB: 1965/06/19 MRN: [id number] [place] [address]   Dear Ms. Blanke,  You are scheduled for a Myocardial Perfusion Imaging Study on: 08/02/2018  at 11:15 am .  Please arrive 15 minutes prior to your appointment time for registration and insurance purposes.  The test will take approximately 3 to 4 hours to complete; you may bring reading material.  If someone comes with you to your appointment, they will need to remain in the main lobby due to limited space in the testing area. **If you are pregnant or breastfeeding, please notify the nuclear lab prior to your appointment**  How to prepare for your Myocardial Perfusion Test: . Do not eat or drink 3 hours prior to your test, except you may have water. . Do not consume products containing caffeine (regular or decaffeinated) 12 hours prior to your test. (ex: coffee, chocolate, sodas, tea). . Do bring a list of your current medications with you.  If not listed below, you may take your medications as  normal.  . Hold triameterene-hydrochlorothiazide the day of the test.  . Do wear comfortable clothes (no dresses or overalls) and walking shoes, tennis shoes preferred (No heels or open toe shoes are allowed). . Do NOT wear cologne, perfume, aftershave, or lotions (deodorant is allowed). . If these instructions are not followed, your test will have to be rescheduled.  Please report to [address] for your test.  If you have questions or concerns about your appointment, you can call the [facility] at [phone].  If you cannot keep your appointment, please provide 24 hours notification to the Nuclear Lab, to avoid a possible $50 charge to your account.  Your physician has recommended that you wear a holter monitor. Holter monitors are medical devices that record the heart's electrical activity. Doctors most often use these monitors to diagnose arrhythmias. Arrhythmias are problems with the speed or rhythm of the heartbeat. The monitor is a small, portable device. You can wear one while you do your normal daily activities. This is usually used to diagnose what is causing palpitations/syncope (passing out). Wear for 7 days.     Follow-Up: At [facility], you and your health needs are our priority.  As part of our continuing mission to provide you with exceptional heart care, we have created designated Provider Care Teams.  These Care Teams include your primary Cardiologist (physician) and Advanced Practice Providers (APPs -  Physician Assistants and Nurse Practitioners) who all work together to provide you with the  care you need, when you need it. You will need a follow up appointment in 1 months.  Please call our office 2 months in advance to schedule this appointment.  You may see No primary care provider on file. or another member of our [employer] Provider Team in [place]: Shirlee More, MD . Jyl Heinz, MD  Any Other Special Instructions  Will Be Listed Below (If Applicable).   Cardiac Nuclear Scan A cardiac nuclear scan is a test that measures blood flow to the heart when a person is resting and when he or she is exercising. The test looks for problems such as:  Not enough blood reaching a portion of the heart.  The heart muscle not working normally. You may need this test if:  You have heart disease.  You have had abnormal lab results.  You have had heart surgery or a balloon procedure to open up blocked arteries (angioplasty).  You have chest pain.  You have shortness of breath. In this test, a radioactive dye (tracer) is injected into your bloodstream. After the tracer has traveled to your heart, an imaging device is used to measure how much of the tracer is absorbed by or distributed to various areas of your heart. This procedure is usually done at a hospital and takes 2-4 hours. Tell a health care provider about:  Any allergies you have.  All medicines you are taking, including vitamins, herbs, eye drops, creams, and over-the-counter medicines.  Any problems you or family members have had with anesthetic medicines.  Any blood disorders you have.  Any surgeries you have had.  Any medical conditions you have.  Whether you are pregnant or may be pregnant. What are the risks? Generally, this is a safe procedure. However, problems may occur, including:  Serious chest pain and heart attack. This is only a risk if the stress portion of the test is done.  Rapid heartbeat.  Sensation of warmth in your chest. This usually passes quickly.  Allergic reaction to the tracer. What happens before the procedure?  Ask your health care provider about changing or stopping your regular medicines. This is especially important if you are taking diabetes medicines or blood thinners.  Follow instructions from your health care provider about eating or drinking restrictions.  Remove your jewelry on the day of the  procedure. What happens during the procedure?  An IV will be inserted into one of your veins.  Your health care provider will inject a small amount of radioactive tracer through the IV.  You will wait for 20-40 minutes while the tracer travels through your bloodstream.  Your heart activity will be monitored with an electrocardiogram (ECG).  You will lie down on an exam table.  Images of your heart will be taken for about 15-20 minutes.  You may also have a stress test. For this test, one of the following may be done: ? You will exercise on a treadmill or stationary bike. While you exercise, your heart's activity will be monitored with an ECG, and your blood pressure will be checked. ? You will be given medicines that will increase blood flow to parts of your heart. This is done if you are unable to exercise.  When blood flow to your heart has peaked, a tracer will again be injected through the IV.  After 20-40 minutes, you will get back on the exam table and have more images taken of your heart.  Depending on the type of tracer used, scans may need  to be repeated 3-4 hours later.  Your IV line will be removed when the procedure is over. The procedure may vary among health care providers and hospitals. What happens after the procedure?  Unless your health care provider tells you otherwise, you may return to your normal schedule, including diet, activities, and medicines.  Unless your health care provider tells you otherwise, you may increase your fluid intake. This will help to flush the contrast dye from your body. Drink enough fluid to keep your urine pale yellow.  Ask your health care provider, or the department that is doing the test: ? When will my results be ready? ? How will I get my results? Summary  A cardiac nuclear scan measures the blood flow to the heart when a person is resting and when he or she is exercising.  Tell your health care provider if you are pregnant.   Before the procedure, ask your health care provider about changing or stopping your regular medicines. This is especially important if you are taking diabetes medicines or blood thinners.  After the procedure, unless your health care provider tells you otherwise, increase your fluid intake. This will help flush the contrast dye from your body.  After the procedure, unless your health care provider tells you otherwise, you may return to your normal schedule, including diet, activities, and medicines. This information is not intended to replace advice given to you by your health care provider. Make sure you discuss any questions you have with your health care provider. Document Released: 01/30/2004 Document Revised: 06/20/2017 Document Reviewed: 06/20/2017 Elsevier Interactive Patient Education  [employer].  '

## 2018-07-15 NOTE — Patient Instructions (Signed)
Thyroid medication will be adjusted with follow-up in 6 weeks.  Cardiology referral made for chest pain and hyperlipidemia.

## 2018-07-26 ENCOUNTER — Telehealth (HOSPITAL_COMMUNITY): Payer: Self-pay | Admitting: *Deleted

## 2018-07-26 NOTE — Telephone Encounter (Signed)
Left message on voicemail per DPR in reference to upcoming appointment scheduled on 08/02/18 at 11:15 with detailed instructions given per Myocardial Perfusion Study Information Sheet for the test. LM to arrive 15 minutes early, and that it is imperative to arrive on time for appointment to keep from having the test rescheduled. If you need to cancel or reschedule your appointment, please call the office within 24 hours of your appointment. Failure to do so may result in a cancellation of your appointment, and a $50 no show fee. Phone number given for call back for any questions.

## 2018-07-27 ENCOUNTER — Other Ambulatory Visit: Payer: 59 | Admitting: Internal Medicine

## 2018-07-27 ENCOUNTER — Other Ambulatory Visit (INDEPENDENT_AMBULATORY_CARE_PROVIDER_SITE_OTHER): Payer: 59

## 2018-07-27 DIAGNOSIS — R079 Chest pain, unspecified: Secondary | ICD-10-CM

## 2018-08-02 ENCOUNTER — Other Ambulatory Visit: Payer: Self-pay

## 2018-08-02 ENCOUNTER — Ambulatory Visit (INDEPENDENT_AMBULATORY_CARE_PROVIDER_SITE_OTHER): Payer: 59

## 2018-08-02 DIAGNOSIS — R079 Chest pain, unspecified: Secondary | ICD-10-CM

## 2018-08-02 LAB — MYOCARDIAL PERFUSION IMAGING
LV dias vol: 79 mL (ref 46–106)
LV sys vol: 37 mL
Peak HR: 109 {beats}/min
Rest HR: 81 {beats}/min
SDS: 1
SRS: 0
SSS: 1
TID: 1.11

## 2018-08-02 MED ORDER — REGADENOSON 0.4 MG/5ML IV SOLN
0.4000 mg | Freq: Once | INTRAVENOUS | Status: AC
  Administered 2018-08-02: 0.4 mg via INTRAVENOUS

## 2018-08-02 MED ORDER — TECHNETIUM TC 99M TETROFOSMIN IV KIT
10.9000 | PACK | Freq: Once | INTRAVENOUS | Status: AC | PRN
  Administered 2018-08-02: 10.9 via INTRAVENOUS

## 2018-08-02 MED ORDER — TECHNETIUM TC 99M TETROFOSMIN IV KIT
32.3000 | PACK | Freq: Once | INTRAVENOUS | Status: AC | PRN
  Administered 2018-08-02: 32.3 via INTRAVENOUS

## 2018-08-04 ENCOUNTER — Telehealth: Payer: Self-pay | Admitting: *Deleted

## 2018-08-04 NOTE — Telephone Encounter (Signed)
Telephone call to patient. Left message to return call. 

## 2018-08-04 NOTE — Telephone Encounter (Signed)
-----   Message from Park Liter, MD sent at 08/03/2018  9:22 AM EDT ----- Normal stress test

## 2018-08-10 ENCOUNTER — Other Ambulatory Visit: Payer: 59 | Admitting: Internal Medicine

## 2018-08-10 DIAGNOSIS — E785 Hyperlipidemia, unspecified: Secondary | ICD-10-CM

## 2018-08-10 DIAGNOSIS — E039 Hypothyroidism, unspecified: Secondary | ICD-10-CM

## 2018-08-10 DIAGNOSIS — I10 Essential (primary) hypertension: Secondary | ICD-10-CM

## 2018-08-11 ENCOUNTER — Ambulatory Visit (INDEPENDENT_AMBULATORY_CARE_PROVIDER_SITE_OTHER): Payer: 59 | Admitting: Internal Medicine

## 2018-08-11 ENCOUNTER — Other Ambulatory Visit: Payer: Self-pay

## 2018-08-11 ENCOUNTER — Encounter: Payer: Self-pay | Admitting: Internal Medicine

## 2018-08-11 VITALS — BP 120/80 | HR 99 | Temp 98.3°F | Ht 62.0 in | Wt 190.0 lb

## 2018-08-11 DIAGNOSIS — E039 Hypothyroidism, unspecified: Secondary | ICD-10-CM | POA: Diagnosis not present

## 2018-08-11 DIAGNOSIS — Z87898 Personal history of other specified conditions: Secondary | ICD-10-CM

## 2018-08-11 DIAGNOSIS — Z8614 Personal history of Methicillin resistant Staphylococcus aureus infection: Secondary | ICD-10-CM

## 2018-08-11 DIAGNOSIS — G4709 Other insomnia: Secondary | ICD-10-CM

## 2018-08-11 DIAGNOSIS — R079 Chest pain, unspecified: Secondary | ICD-10-CM

## 2018-08-11 DIAGNOSIS — Z8669 Personal history of other diseases of the nervous system and sense organs: Secondary | ICD-10-CM | POA: Diagnosis not present

## 2018-08-11 DIAGNOSIS — I10 Essential (primary) hypertension: Secondary | ICD-10-CM

## 2018-08-11 DIAGNOSIS — J4520 Mild intermittent asthma, uncomplicated: Secondary | ICD-10-CM

## 2018-08-11 DIAGNOSIS — G4733 Obstructive sleep apnea (adult) (pediatric): Secondary | ICD-10-CM

## 2018-08-11 LAB — HEPATIC FUNCTION PANEL
AG Ratio: 1.8 (calc) (ref 1.0–2.5)
ALT: 14 U/L (ref 6–29)
AST: 16 U/L (ref 10–35)
Albumin: 4.6 g/dL (ref 3.6–5.1)
Alkaline phosphatase (APISO): 70 U/L (ref 37–153)
Bilirubin, Direct: 0.2 mg/dL (ref 0.0–0.2)
Globulin: 2.5 g/dL (calc) (ref 1.9–3.7)
Indirect Bilirubin: 0.7 mg/dL (calc) (ref 0.2–1.2)
Total Bilirubin: 0.9 mg/dL (ref 0.2–1.2)
Total Protein: 7.1 g/dL (ref 6.1–8.1)

## 2018-08-11 LAB — LIPID PANEL
Cholesterol: 190 mg/dL (ref <200)
HDL: 76 mg/dL (ref >=50)
LDL Cholesterol (Calc): 93 mg/dL (calc)
Non-HDL Cholesterol (Calc): 114 mg/dL (calc) (ref <130)
Total CHOL/HDL Ratio: 2.5 (calc) (ref <5.0)
Triglycerides: 111 mg/dL (ref <150)

## 2018-08-11 LAB — HEMOGLOBIN A1C
Hgb A1c MFr Bld: 5.5 % of total Hgb (ref <5.7)
Mean Plasma Glucose: 111 (calc)
eAG (mmol/L): 6.2 (calc)

## 2018-08-11 LAB — TSH: TSH: 1.74 mIU/L

## 2018-08-11 MED ORDER — ROSUVASTATIN CALCIUM 5 MG PO TABS: ORAL_TABLET | ORAL | 3 refills | Status: DC

## 2018-08-11 MED ORDER — LEVOTHYROXINE SODIUM 112 MCG PO TABS: 112.0000 ug | ORAL_TABLET | Freq: Every day | ORAL | 1 refills | Status: DC

## 2018-08-11 NOTE — Progress Notes (Signed)
   Subjective:    Patient ID: Rebecca Mccoy, female    DOB: 22-Aug-1965, 52 y.o.   MRN: [id number]  HPI  53 year old Female for follow-up of hyperlipidemia, migraine headaches, hypothyroidism, anxiety and insomnia which have improved with retirement, obstructive sleep apnea.  Main reason she is here is TSH was low on current dose of thyroid replacement in June and was adjusted and is now normal at 1.74.  Previously TSH in June was 0.06.  Hemoglobin A1c is normal at 5.5%.  Lipid panel and liver functions are normal.  This is excellent as results in June total cholesterol was 273, triglycerides 161 and LDL cholesterol 166.  Trying to get some exercise on a regular basis and watching her diet.  She is on Crestor 5 mg 3 times a week.  She is on levothyroxine 0.112 mg daily.  Seldom has to take Xanax at present time.  Remains on Wellbutrin.  Symbicort use for history of asthma.  Remains on Maxide 75 for the dependent edema and hypertension.  Review of Systems no new complaints     Objective:   Physical Exam Blood pressure 120/80, pulse 99 blood pressure 120/80, BMI 34.75, weight 190 pounds.  Neck is supple.  Chest clear.  Cardiac exam regular rate and rhythm.  Extremities without edema       Assessment & Plan:  History of hypertension-treated with diuretic  Hyperlipidemia treated with Crestor 3 times weekly  Hypothyroidism-stable on current dose of thyroid replacement  History of asthma treated with inhalers  Chest pain-wants cardiac evaluation has appointment in August  History of anxiety insomnia treated with Xanax but not taking that much right now.  Diagnosed in 2019 with moderate sleep apnea by Dr. Halford Chessman  Has GYN exam in September.  Return in June 2021 or as needed.

## 2018-08-15 NOTE — Patient Instructions (Signed)
To have cardiology evaluation in the near future.  Current medications and follow-up in 1 year or as needed.

## 2018-08-16 ENCOUNTER — Ambulatory Visit: Payer: 59 | Admitting: Cardiology

## 2018-08-22 ENCOUNTER — Ambulatory Visit (INDEPENDENT_AMBULATORY_CARE_PROVIDER_SITE_OTHER): Payer: 59 | Admitting: Cardiology

## 2018-08-22 ENCOUNTER — Other Ambulatory Visit: Payer: Self-pay

## 2018-08-22 ENCOUNTER — Encounter: Payer: Self-pay | Admitting: Cardiology

## 2018-08-22 VITALS — BP 130/80 | HR 93 | Ht 62.0 in | Wt 191.0 lb

## 2018-08-22 DIAGNOSIS — E785 Hyperlipidemia, unspecified: Secondary | ICD-10-CM | POA: Diagnosis not present

## 2018-08-22 DIAGNOSIS — I471 Supraventricular tachycardia, unspecified: Secondary | ICD-10-CM

## 2018-08-22 DIAGNOSIS — R0789 Other chest pain: Secondary | ICD-10-CM | POA: Diagnosis not present

## 2018-08-22 HISTORY — DX: Supraventricular tachycardia: I47.1

## 2018-08-22 HISTORY — DX: Supraventricular tachycardia, unspecified: I47.10

## 2018-08-22 MED ORDER — METOPROLOL SUCCINATE ER 25 MG PO TB24: 25.0000 mg | ORAL_TABLET | Freq: Every day | ORAL | 1 refills | Status: DC

## 2018-08-22 NOTE — Patient Instructions (Signed)
Medication Instructions:  Your physician has recommended you make the following change in your medication:  Start: Metoprolol succinate 25 mg daily  If you need a refill on your cardiac medications before your next appointment, please call your pharmacy.   Lab work: None. If you have labs (blood work) drawn today and your tests are completely normal, you will receive your results only by: Marland Kitchen MyChart Message (if you have MyChart) OR . A paper copy in the mail If you have any lab test that is abnormal or we need to change your treatment, we will call you to review the results.  Testing/Procedures: None.   Follow-Up: At [facility], you and your health needs are our priority.  As part of our continuing mission to provide you with exceptional heart care, we have created designated Provider Care Teams.  These Care Teams include your primary Cardiologist (physician) and Advanced Practice Providers (APPs -  Physician Assistants and Nurse Practitioners) who all work together to provide you with the care you need, when you need it. You will need a follow up appointment in 3 months.  Please call our office 2 months in advance to schedule this appointment.  You may see No primary care provider on file. or another member of our [employer] Provider Team in [place]: Shirlee More, MD . Jyl Heinz, MD  Any Other Special Instructions Will Be Listed Below (If Applicable).

## 2018-08-22 NOTE — Progress Notes (Signed)
Cardiology Office Note:    Date:  08/22/2018   ID:  Rebecca Mccoy, DOB 05/29/1965, MRN [id number]  PCP:  Elby Showers, MD  Cardiologist:  Rebecca Campus, MD    Referring MD: Elby Showers, MD   Chief Complaint  Patient presents with  . Follow-up  Doing well  History of Present Illness:    Rebecca Mccoy is a 53 y.o. female with past medical history significant for tachycardia which appears to be sinus, also atypical chest pain.  Stress test was done which showed no evidence of ischemia.  She did wear monitor which showed sinus tachycardia average heart rate within 24 hours was 99, she did have few runs of nonsustained supraventricular tachycardia.  Overall she is doing better she said she got more energy she can do more she denies having a palpitations now  Past Medical History:  Diagnosis Date  . Anxiety   . Fluid retention   . GERD (gastroesophageal reflux disease)   . Heart murmur    infant  . Hypertension   . Migraines   . OSA (obstructive sleep apnea) 10/04/2017  . PID (pelvic inflammatory disease)   . Recurrent upper respiratory infection (URI)    April 2019  . Thyroid disease   . Urticaria     Past Surgical History:  Procedure Laterality Date  . AUGMENTATION MAMMAPLASTY Bilateral 2005  . BREAST SURGERY  2005   Augmentation  . CARPAL TUNNEL RELEASE Bilateral    R in '07 and L '06  . COSMETIC SURGERY    . leg lift    . TONSILLECTOMY  1972  . TUBAL LIGATION  1989  . tummy tuck  2005    Current Medications: Current Meds  Medication Sig  . albuterol (PROVENTIL HFA;VENTOLIN HFA) 108 (90 Base) MCG/ACT inhaler Inhale 1-2 puffs into the lungs every 6 (six) hours as needed for wheezing or shortness of breath.  . ALPRAZolam (XANAX) 0.5 MG tablet TAKE 1 TABLET BY MOUTH AT BEDTIME AS NEEDED  . buPROPion (WELLBUTRIN XL) 150 MG 24 hr tablet TAKE 1 TABLET(150 MG) BY MOUTH DAILY  . cetirizine (ZYRTEC) 10 MG chewable tablet Chew 10 mg by mouth daily.  Marland Kitchen  glucosamine-chondroitin 500-400 MG tablet Take 1 tablet by mouth daily.  Marland Kitchen glycopyrrolate (ROBINUL) 2 MG tablet TAKE 1 TABLET BY MOUTH TWICE DAILY  . levothyroxine (SYNTHROID) 112 MCG tablet Take 1 tablet (112 mcg total) by mouth daily.  . Omega-3 Fatty Acids (FISH OIL) 1000 MG CAPS Take 2 capsules by mouth daily.  . Probiotic Product (PROBIOTIC DAILY PO) Take by mouth.  . rosuvastatin (CRESTOR) 5 MG tablet One po 3 times a week  . SYMBICORT 160-4.5 MCG/ACT inhaler INHALE 2 PUFFS INTO THE LUNGS TWICE DAILY  . triamterene-hydrochlorothiazide (MAXZIDE) 75-50 MG tablet TAKE 1 TABLET BY MOUTH ONCE DAILY  . TURMERIC PO Take 1 tablet daily by mouth.     Allergies:   Patient has no known allergies.   Social History   Socioeconomic History  . Marital status: Married    Spouse name: Not on file  . Number of children: 2  . Years of education: Not on file  . Highest education level: Not on file  Occupational History  . Occupation: Garment/textile technologist  Social Needs  . Financial resource strain: Not on file  . Food insecurity    Worry: Not on file    Inability: Not on file  . Transportation needs    Medical: Not on file  Non-medical: Not on file  Tobacco Use  . Smoking status: Never Smoker  . Smokeless tobacco: Never Used  Substance and Sexual Activity  . Alcohol use: Yes    Alcohol/week: 0.0 - 2.0 standard drinks    Comment: 1-2 beer weekly  . Drug use: No  . Sexual activity: Yes    Partners: Male    Birth control/protection: Surgical    Comment: INTERCOUSE AGE 36, SEXUAL PARTNERS MORE THAN 5  Lifestyle  . Physical activity    Days per week: Not on file    Minutes per session: Not on file  . Stress: Not on file  Relationships  . Social Herbalist on phone: Not on file    Gets together: Not on file    Attends religious service: Not on file    Active member of club or organization: Not on file    Attends meetings of clubs or organizations: Not on file     Relationship status: Not on file  Other Topics Concern  . Not on file  Social History Narrative  . Not on file     Family History: The patient's family history includes Allergic rhinitis in her mother; Colon polyps in her mother; Crohn's disease in her cousin; Diabetes in her maternal grandfather, maternal grandmother, and mother; Hyperlipidemia in her father; Pancreatic cancer in her father; Stroke in her maternal grandmother and paternal grandmother; Uterine cancer in her mother. There is no history of Colon cancer, Stomach cancer, Esophageal cancer, Rectal cancer, Liver cancer, Eczema, Urticaria, or Asthma. ROS:   Please see the history of present illness.    All 14 point review of systems negative except as described per history of present illness  EKGs/Labs/Other Studies Reviewed:      Recent Labs: 06/22/2018: BUN 12; Creat 1.04; Hemoglobin 13.4; Platelets 282; Potassium 3.6; Sodium 139 08/10/2018: ALT 14; TSH 1.74  Recent Lipid Panel    Component Value Date/Time   CHOL 190 08/10/2018 0959   TRIG 111 08/10/2018 0959   HDL 76 08/10/2018 0959   CHOLHDL 2.5 08/10/2018 0959   VLDL 22 07/26/2016 0914   LDLCALC 93 08/10/2018 0959    Physical Exam:    VS:  BP 130/80   Pulse 93   Ht 5\' 2"  (1.575 m)   Wt 191 lb (86.6 kg)   SpO2 98%   BMI 34.93 kg/m     Wt Readings from Last 3 Encounters:  08/22/18 191 lb (86.6 kg)  08/11/18 190 lb (86.2 kg)  08/02/18 188 lb (85.3 kg)     GEN:  Well nourished, well developed in no acute distress HEENT: Normal NECK: No JVD; No carotid bruits LYMPHATICS: No lymphadenopathy CARDIAC: RRR, no murmurs, no rubs, no gallops RESPIRATORY:  Clear to auscultation without rales, wheezing or rhonchi  ABDOMEN: Soft, non-tender, non-distended MUSCULOSKELETAL:  No edema; No deformity  SKIN: Warm and dry LOWER EXTREMITIES: no swelling NEUROLOGIC:  Alert and oriented x 3 PSYCHIATRIC:  Normal affect   ASSESSMENT:    1. Atypical chest pain   2.  Dyslipidemia   3. Supraventricular tachycardia ([place])    PLAN:    In order of problems listed above:  1. Atypical chest pain.  Stress test negative.  Risk factors modifications.  We talked about healthy lifestyle again. 2. Dyslipidemia: Continue with Crestor. 3. Supraventricular tachycardia only very short episodes on top of that sinus tachycardia I will ask her to start taking 25 mg metoprolol succinate.  I told her she can  take it on a regular basis if she preferred to take it only as needed basis.  See her back in the office in 3 months or sooner if she has a problem   Medication Adjustments/Labs and Tests Ordered: Current medicines are reviewed at length with the patient today.  Concerns regarding medicines are outlined above.  No orders of the defined types were placed in this encounter.  Medication changes: No orders of the defined types were placed in this encounter.   Signed, Park Liter, MD, [facility] 08/22/2018 10:06 AM    [place]

## 2018-08-23 ENCOUNTER — Other Ambulatory Visit: Payer: Self-pay

## 2018-08-23 MED ORDER — BUPROPION HCL ER (XL) 150 MG PO TB24: ORAL_TABLET | ORAL | 0 refills | Status: DC

## 2018-08-23 NOTE — Telephone Encounter (Signed)
Medication refill request: bupropion xl 150mg  Last AEX:  09-14-17 Next AEX: 09-20-2018 Last MMG (if hormonal medication request): n/a Refill authorized: please approve if appropriate

## 2018-09-01 ENCOUNTER — Other Ambulatory Visit: Payer: Self-pay

## 2018-09-01 MED ORDER — LEVOTHYROXINE SODIUM 112 MCG PO TABS: 112.0000 ug | ORAL_TABLET | Freq: Every day | ORAL | 5 refills | Status: DC

## 2018-09-12 NOTE — Progress Notes (Signed)
53 y.o. G44P2002 Married White or Caucasian Not Hispanic or Latino female here for annual exam.  No vaginal bleeding. No dyspareunia.   Only has mild GSI with a cough  She went off Zoloft 7-8 months ago, still on Wellbutrin. Depression is better. May go off the Wellbutrin, not sure.   Libido is worse than last year. She can enjoy sex and have orgasms.   She is struggling with her weight. She is down 9 lbs since last year. She is doing intermittent fasting. She has been at a standstill for a while.     No LMP recorded (lmp unknown). Patient is postmenopausal.          Sexually active: Yes.    The current method of family planning is tubal ligation.    Exercising: Yes.    walking 1 mile per day Smoker:  no  Health Maintenance: Pap:  01/30/2016 WNL History of abnormal Pap:  no MMG:  05/17/2017 Birads 1 negative Colonoscopy:  2018- History of polyp, follow up in 5 years per Dr Ardis Hughs at [facility] BMD:   never TDaP:  12/10/2016 Gardasil: n/a   reports that she has never smoked. She has never used smokeless tobacco. She reports current alcohol use. She reports that she does not use drugs. 2 kids, one granddaughter (almost 72). 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 Married x 5 years, happy.   Past Medical History:  Diagnosis Date  . Anxiety   . Fluid retention   . GERD (gastroesophageal reflux disease)   . Heart murmur    infant  . Hypertension   . Migraines   . OSA (obstructive sleep apnea) 10/04/2017  . PID (pelvic inflammatory disease)   . Recurrent upper respiratory infection (URI)    April 2019  . Sleep apnea   . Thyroid disease   . Urticaria     Past Surgical History:  Procedure Laterality Date  . AUGMENTATION MAMMAPLASTY Bilateral 2005  . BREAST SURGERY  2005   Augmentation  . CARPAL TUNNEL RELEASE  Bilateral    R in '07 and L '06  . COSMETIC SURGERY    . leg lift    . TONSILLECTOMY  1972  . TUBAL LIGATION  1989  . tummy tuck  2005    Current Outpatient Medications  Medication Sig Dispense Refill  . albuterol (PROVENTIL HFA;VENTOLIN HFA) 108 (90 Base) MCG/ACT inhaler Inhale 1-2 puffs into the lungs every 6 (six) hours as needed for wheezing or shortness of breath. 1 Inhaler prn  . ALPRAZolam (XANAX) 0.5 MG tablet TAKE 1 TABLET BY MOUTH AT BEDTIME AS NEEDED 90 tablet 1  . buPROPion (WELLBUTRIN XL) 150 MG 24 hr tablet TAKE 1 TABLET(150 MG) BY MOUTH DAILY 90 tablet 3  . glucosamine-chondroitin 500-400 MG tablet Take 1 tablet by mouth daily.    Marland Kitchen glycopyrrolate (ROBINUL) 2 MG tablet TAKE 1 TABLET BY MOUTH TWICE DAILY 60 tablet 1  . levothyroxine (SYNTHROID) 112 MCG tablet Take 1 tablet (112 mcg total) by mouth daily. 30 tablet 5  . metoprolol succinate (TOPROL-XL) 25 MG 24 hr tablet Take 1 tablet (25 mg total) by mouth daily. Take with or immediately following a meal. 90 tablet 1  . Omega-3 Fatty Acids (FISH OIL) 1000 MG CAPS Take 2 capsules by mouth daily.    . Probiotic Product (PROBIOTIC DAILY PO) Take  by mouth.    . rosuvastatin (CRESTOR) 5 MG tablet One po 3 times a week 36 tablet 3  . SYMBICORT 160-4.5 MCG/ACT inhaler INHALE 2 PUFFS INTO THE LUNGS TWICE DAILY 10.2 g prn  . triamterene-hydrochlorothiazide (MAXZIDE) 75-50 MG tablet TAKE 1 TABLET BY MOUTH ONCE DAILY 90 tablet 3  . TURMERIC PO Take 1 tablet daily by mouth.     No current facility-administered medications for this visit.     Family History  Problem Relation Age of Onset  . Diabetes Mother   . Uterine cancer Mother   . Colon polyps Mother   . Allergic rhinitis Mother   . Pancreatic cancer Father   . Hyperlipidemia Father   . Crohn's disease Cousin   . Diabetes Maternal Grandmother   . Stroke Maternal Grandmother   . Diabetes Maternal Grandfather   . Stroke Paternal Grandmother   . Colon cancer Neg Hx   .  Stomach cancer Neg Hx   . Esophageal cancer Neg Hx   . Rectal cancer Neg Hx   . Liver cancer Neg Hx   . Eczema Neg Hx   . Urticaria Neg Hx   . Asthma Neg Hx     Review of Systems  Constitutional:       Weight gain  HENT: Negative.   Eyes: Negative.   Respiratory: Negative.   Cardiovascular: Negative.   Gastrointestinal: Negative.   Endocrine: Negative.   Genitourinary:       Decreased libido  Musculoskeletal: Negative.   Skin: Negative.   Allergic/Immunologic: Negative.   Neurological: Negative.   Hematological: Negative.   Psychiatric/Behavioral: Negative.     Exam:   BP 112/70 (BP Location: Right Arm, Patient Position: Sitting, Cuff Size: Large)   Pulse 84   Temp (!) 97.1 F (36.2 C) (Skin)   Ht 5\' 2"  (1.575 m)   Wt 189 lb (85.7 kg)   LMP  (LMP Unknown)   BMI 34.57 kg/m   Weight change: @WEIGHTCHANGE @ Height:   Height: 5\' 2"  (157.5 cm)  Ht Readings from Last 3 Encounters:  09/20/18 5\' 2"  (1.575 m)  08/22/18 5\' 2"  (1.575 m)  08/11/18 5\' 2"  (1.575 m)    General appearance: alert, cooperative and appears stated age Head: Normocephalic, without obvious abnormality, atraumatic Neck: no adenopathy, supple, symmetrical, trachea midline and thyroid normal to inspection and palpation Lungs: clear to auscultation bilaterally Cardiovascular: regular rate and rhythm Breasts: normal appearance, no masses or tenderness Abdomen: soft, non-tender; non distended,  no masses,  no organomegaly Extremities: extremities normal, atraumatic, no cyanosis or edema Skin: Skin color, texture, turgor normal. No rashes or lesions Lymph nodes: Cervical, supraclavicular, and axillary nodes normal. No abnormal inguinal nodes palpated Neurologic: Grossly normal   Pelvic: External genitalia:  no lesions              Urethra:  normal appearing urethra with no masses, tenderness or lesions              Bartholins and Skenes: normal                 Vagina: normal appearing vagina with  normal color and discharge, no lesions              Cervix: no lesions               Bimanual Exam:  Uterus:  normal size, contour, position, consistency, mobility, non-tender              Adnexa: no  mass, fullness, tenderness               Rectovaginal: Confirms               Anus:  normal sphincter tone, no lesions  Chaperone was present for exam.  A:  Well Woman with normal exam  Struggling with weight loss  Worsening libido, we discussed libido, she will let me know if she wants testosterone levels checked.   P:   Pap next year  Labs with primary  We discussed weight loss  Mammogram UTD  Colonoscopy UTD  Discussed breast self exam  Discussed calcium and vit D intake

## 2018-09-18 ENCOUNTER — Other Ambulatory Visit: Payer: Self-pay

## 2018-09-20 ENCOUNTER — Ambulatory Visit: Payer: 59 | Admitting: Obstetrics and Gynecology

## 2018-09-20 ENCOUNTER — Other Ambulatory Visit: Payer: Self-pay

## 2018-09-20 ENCOUNTER — Encounter: Payer: Self-pay | Admitting: Obstetrics and Gynecology

## 2018-09-20 VITALS — BP 112/70 | HR 84 | Temp 97.1°F | Ht 62.0 in | Wt 189.0 lb

## 2018-09-20 DIAGNOSIS — G4730 Sleep apnea, unspecified: Secondary | ICD-10-CM

## 2018-09-20 DIAGNOSIS — R6882 Decreased libido: Secondary | ICD-10-CM | POA: Diagnosis not present

## 2018-09-20 DIAGNOSIS — Z01419 Encounter for gynecological examination (general) (routine) without abnormal findings: Secondary | ICD-10-CM | POA: Diagnosis not present

## 2018-09-20 MED ORDER — BUPROPION HCL ER (XL) 150 MG PO TB24: ORAL_TABLET | ORAL | 3 refills | Status: DC

## 2018-09-20 NOTE — Patient Instructions (Signed)

## 2018-10-04 ENCOUNTER — Ambulatory Visit (HOSPITAL_COMMUNITY)
Admission: EM | Admit: 2018-10-04 | Discharge: 2018-10-04 | Disposition: A | Discharge status: Home / Self Care | Payer: 59 | Attending: Family Medicine | Admitting: Family Medicine

## 2018-10-04 ENCOUNTER — Ambulatory Visit (INDEPENDENT_AMBULATORY_CARE_PROVIDER_SITE_OTHER): Payer: 59

## 2018-10-04 ENCOUNTER — Encounter (HOSPITAL_COMMUNITY): Payer: Self-pay

## 2018-10-04 DIAGNOSIS — M25572 Pain in left ankle and joints of left foot: Secondary | ICD-10-CM

## 2018-10-04 DIAGNOSIS — S93492A Sprain of other ligament of left ankle, initial encounter: Secondary | ICD-10-CM

## 2018-10-04 NOTE — ED Triage Notes (Signed)
Patient reports she fell last night 10/03/2018, when she was going down the stairs in her front porch, she injury her left ankle, is swollen and interfering with her gait. She is using cold compresses.

## 2018-10-04 NOTE — ED Provider Notes (Signed)
[place]   [id number] 10/04/18 Arrival Time: 1003  ASSESSMENT & PLAN:  1. Acute left ankle pain   2. Sprain of anterior talofibular ligament of left ankle, initial encounter     I have personally viewed the imaging studies ordered this visit. No fractures appreciated.  Rest the injured area as much as practical. WBAT with ASO brace. Prefers OTC ibuprofen 800mg  TID with food.  Natural history and expected course discussed. Questions answered. Rest, ice, compression, elevation (RICE) therapy. Fit with ankle brace for use over next 1-2 weeks.   Recommend: Follow-up Information    [place].   Why: If worsening or failing to improve as anticipated. Contact information: [address] [place] [id number]          Reviewed expectations re: course of current medical issues. Questions answered. Outlined signs and symptoms indicating need for more acute intervention. Patient verbalized understanding. After Visit Summary given.  SUBJECTIVE: History from: patient. Rebecca Mccoy is a 53 y.o. female who reports fairly persistent moderate pain of her left ankle; described as aching; without radiation. Onset: abrupt, yesterday evenign. Injury/trama: reports tripping on stairs and twisting ankle; immediate left lateral ankle pain. Able to bear weight afterward but with pain. Has noted increased swelling overnight. Symptoms have progressed to a point and plateaued since beginning. Aggravating factors: certain movements and weight bearing. Alleviating factors: rest and elevation of affected extremity. Associated symptoms: none reported. Extremity sensation changes or weakness: none. Self treatment: cold compress with questionable mild help. History of similar: no.  Past Surgical History:  Procedure Laterality Date  . AUGMENTATION MAMMAPLASTY Bilateral 2005  . BREAST SURGERY  2005   Augmentation  .  CARPAL TUNNEL RELEASE Bilateral    R in '07 and L '06  . COSMETIC SURGERY    . leg lift    . TONSILLECTOMY  1972  . TUBAL LIGATION  1989  . tummy tuck  2005     ROS: As per HPI. All other systems negative.    OBJECTIVE:  Vitals:   10/04/18 1020  BP: 106/70  Pulse: 84  Resp: 16  Temp: 98.6 F (37 C)  TempSrc: Oral  SpO2: 95%    General appearance: alert; no distress HEENT: [place]; AT Neck: supple with FROM Resp: unlabored respirations Extremities: . LLE: warm and well perfused; fairly well localized moderate tenderness over left lateral ankle (ATFL distribution); without gross deformities; with mild swelling; without bruising; ROM: normal with reported discomfort CV: brisk extremity capillary refill of LLE; 2+ DP/PT pulses of LLE. Skin: warm and dry; no visible rashes Neurologic: gait is difficult to assess given ankle pain on weight bearing; normal reflexes of LLE; normal sensation of LLE; normal strength of LLE Psychological: alert and cooperative; normal mood and affect  Imaging: Dg Ankle Complete Left  Result Date: 10/04/2018 CLINICAL DATA:  Fall, pain and bruising EXAM: LEFT ANKLE COMPLETE - 3+ VIEW COMPARISON:  None. FINDINGS: No fracture or dislocation of the left ankle. The ankle mortise is well preserved. There is an ankle joint effusion. Soft tissue edema, most significantly over the lateral malleolus. IMPRESSION: No fracture or dislocation of the left ankle. The ankle mortise is well preserved. There is an ankle joint effusion. Soft tissue edema, most significantly over the lateral malleolus. Electronically Signed   By: Eddie Candle M.D.   On: 10/04/2018 11:14     No Known Allergies  Past Medical History:  Diagnosis Date  .  Anxiety   . Fluid retention   . GERD (gastroesophageal reflux disease)   . Heart murmur    infant  . Hypertension   . Migraines   . OSA (obstructive sleep apnea) 10/04/2017  . PID (pelvic inflammatory disease)   . Recurrent upper  respiratory infection (URI)    April 2019  . Sleep apnea   . Thyroid disease   . Urticaria    Social History   Socioeconomic History  . Marital status: Married    Spouse name: Not on file  . Number of children: 2  . Years of education: Not on file  . Highest education level: Not on file  Occupational History  . Occupation: Garment/textile technologist  Social Needs  . Financial resource strain: Not on file  . Food insecurity    Worry: Not on file    Inability: Not on file  . Transportation needs    Medical: Not on file    Non-medical: Not on file  Tobacco Use  . Smoking status: Never Smoker  . Smokeless tobacco: Never Used  Substance and Sexual Activity  . Alcohol use: Yes    Alcohol/week: 0.0 - 2.0 standard drinks    Comment: 1-2 beer weekly  . Drug use: No  . Sexual activity: Yes    Partners: Male    Birth control/protection: Surgical, Post-menopausal    Comment: INTERCOUSE AGE 52, SEXUAL PARTNERS MORE THAN 5  Lifestyle  . Physical activity    Days per week: Not on file    Minutes per session: Not on file  . Stress: Not on file  Relationships  . Social Herbalist on phone: Not on file    Gets together: Not on file    Attends religious service: Not on file    Active member of club or organization: Not on file    Attends meetings of clubs or organizations: Not on file    Relationship status: Not on file  Other Topics Concern  . Not on file  Social History Narrative  . Not on file   Family History  Problem Relation Age of Onset  . Diabetes Mother   . Uterine cancer Mother   . Colon polyps Mother   . Allergic rhinitis Mother   . Pancreatic cancer Father   . Hyperlipidemia Father   . Crohn's disease Cousin   . Diabetes Maternal Grandmother   . Stroke Maternal Grandmother   . Diabetes Maternal Grandfather   . Stroke Paternal Grandmother   . Colon cancer Neg Hx   . Stomach cancer Neg Hx   . Esophageal cancer Neg Hx   . Rectal cancer Neg Hx    . Liver cancer Neg Hx   . Eczema Neg Hx   . Urticaria Neg Hx   . Asthma Neg Hx    Past Surgical History:  Procedure Laterality Date  . AUGMENTATION MAMMAPLASTY Bilateral 2005  . BREAST SURGERY  2005   Augmentation  . CARPAL TUNNEL RELEASE Bilateral    R in '07 and L '06  . COSMETIC SURGERY    . leg lift    . TONSILLECTOMY  1972  . TUBAL LIGATION  1989  . tummy tuck  2005      Vanessa Kick, MD 10/04/18 [phone]

## 2018-11-15 ENCOUNTER — Other Ambulatory Visit: Payer: Self-pay | Admitting: Obstetrics and Gynecology

## 2018-11-15 ENCOUNTER — Other Ambulatory Visit: Payer: Self-pay | Admitting: Gastroenterology

## 2018-11-15 DIAGNOSIS — Z1231 Encounter for screening mammogram for malignant neoplasm of breast: Secondary | ICD-10-CM

## 2018-12-07 ENCOUNTER — Ambulatory Visit
Admission: RE | Admit: 2018-12-07 | Discharge: 2018-12-07 | Disposition: A | Discharge status: Home / Self Care | Payer: 59 | Source: Ambulatory Visit | Attending: Obstetrics and Gynecology | Admitting: Obstetrics and Gynecology

## 2018-12-07 ENCOUNTER — Other Ambulatory Visit: Payer: Self-pay

## 2018-12-07 DIAGNOSIS — Z1231 Encounter for screening mammogram for malignant neoplasm of breast: Secondary | ICD-10-CM

## 2018-12-26 ENCOUNTER — Other Ambulatory Visit: Payer: Self-pay | Admitting: Internal Medicine

## 2018-12-27 ENCOUNTER — Ambulatory Visit (INDEPENDENT_AMBULATORY_CARE_PROVIDER_SITE_OTHER): Payer: 59 | Admitting: Cardiology

## 2018-12-27 ENCOUNTER — Encounter: Payer: Self-pay | Admitting: Cardiology

## 2018-12-27 ENCOUNTER — Other Ambulatory Visit: Payer: Self-pay

## 2018-12-27 VITALS — BP 100/60 | HR 79 | Ht 62.0 in | Wt 197.0 lb

## 2018-12-27 DIAGNOSIS — G4733 Obstructive sleep apnea (adult) (pediatric): Secondary | ICD-10-CM | POA: Diagnosis not present

## 2018-12-27 DIAGNOSIS — I471 Supraventricular tachycardia: Secondary | ICD-10-CM

## 2018-12-27 DIAGNOSIS — R0789 Other chest pain: Secondary | ICD-10-CM

## 2018-12-27 DIAGNOSIS — E785 Hyperlipidemia, unspecified: Secondary | ICD-10-CM

## 2018-12-27 NOTE — Patient Instructions (Signed)
Medication Instructions:  Your physician recommends that you continue on your current medications as directed. Please refer to the Current Medication list given to you today.  *If you need a refill on your cardiac medications before your next appointment, please call your pharmacy*  Lab Work: None.  If you have labs (blood work) drawn today and your tests are completely normal, you will receive your results only by: Marland Kitchen MyChart Message (if you have MyChart) OR . A paper copy in the mail If you have any lab test that is abnormal or we need to change your treatment, we will call you to review the results.  Testing/Procedures: None.   Follow-Up: At [facility], you and your health needs are our priority.  As part of our continuing mission to provide you with exceptional heart care, we have created designated Provider Care Teams.  These Care Teams include your primary Cardiologist (physician) and Advanced Practice Providers (APPs -  Physician Assistants and Nurse Practitioners) who all work together to provide you with the care you need, when you need it.  Your next appointment:   1 year(s)  The format for your next appointment:   In Person  Provider:   Jenne Campus, MD  Other Instructions

## 2018-12-27 NOTE — Progress Notes (Signed)
Cardiology Office Note:    Date:  12/27/2018   ID:  Rebecca POLKOWSKI, DOB Aug 21, 1965, MRN [id number]  PCP:  Elby Showers, MD  Cardiologist:  Jenne Campus, MD    Referring MD: Elby Showers, MD   Chief Complaint  Patient presents with  . Follow-up  Doing well  History of Present Illness:    Rebecca Mccoy is a 53 y.o. female with history of supraventricular tachycardia, dyslipidemia.  Comes today to my for follow-up overall doing well.  Denies have any chest pain tightness squeezing pressure burning chest overall is very well.  She complained of being under a lot of stress but she developed some techniques to deal with this which includes exercises  Past Medical History:  Diagnosis Date  . Anxiety   . Fluid retention   . GERD (gastroesophageal reflux disease)   . Heart murmur    infant  . Hypertension   . Migraines   . OSA (obstructive sleep apnea) 10/04/2017  . PID (pelvic inflammatory disease)   . Recurrent upper respiratory infection (URI)    April 2019  . Sleep apnea   . Thyroid disease   . Urticaria     Past Surgical History:  Procedure Laterality Date  . AUGMENTATION MAMMAPLASTY Bilateral 2005  . BREAST SURGERY  2005   Augmentation  . CARPAL TUNNEL RELEASE Bilateral    R in '07 and L '06  . COSMETIC SURGERY    . leg lift    . TONSILLECTOMY  1972  . TUBAL LIGATION  1989  . tummy tuck  2005    Current Medications: Current Meds  Medication Sig  . albuterol (PROVENTIL HFA;VENTOLIN HFA) 108 (90 Base) MCG/ACT inhaler Inhale 1-2 puffs into the lungs every 6 (six) hours as needed for wheezing or shortness of breath.  . ALPRAZolam (XANAX) 0.5 MG tablet TAKE 1 TABLET BY MOUTH AT BEDTIME AS NEEDED  . buPROPion (WELLBUTRIN XL) 150 MG 24 hr tablet TAKE 1 TABLET(150 MG) BY MOUTH DAILY  . glucosamine-chondroitin 500-400 MG tablet Take 1 tablet by mouth daily.  Marland Kitchen glycopyrrolate (ROBINUL) 2 MG tablet TAKE 1 TABLET BY MOUTH TWICE DAILY  . levothyroxine (SYNTHROID) 112  MCG tablet Take 1 tablet (112 mcg total) by mouth daily.  . metoprolol succinate (TOPROL-XL) 25 MG 24 hr tablet Take 1 tablet (25 mg total) by mouth daily. Take with or immediately following a meal.  . Omega-3 Fatty Acids (FISH OIL) 1000 MG CAPS Take 2 capsules by mouth daily.  . Probiotic Product (PROBIOTIC DAILY PO) Take by mouth.  . rosuvastatin (CRESTOR) 5 MG tablet One po 3 times a week  . SYMBICORT 160-4.5 MCG/ACT inhaler INHALE 2 PUFFS INTO THE LUNGS TWICE DAILY  . triamterene-hydrochlorothiazide (MAXZIDE) 75-50 MG tablet TAKE 1 TABLET BY MOUTH ONCE DAILY  . TURMERIC PO Take 1 tablet daily by mouth.     Allergies:   Patient has no known allergies.   Social History   Socioeconomic History  . Marital status: Married    Spouse name: Not on file  . Number of children: 2  . Years of education: Not on file  . Highest education level: Not on file  Occupational History  . Occupation: Garment/textile technologist  Social Needs  . Financial resource strain: Not on file  . Food insecurity    Worry: Not on file    Inability: Not on file  . Transportation needs    Medical: Not on file    Non-medical: Not  on file  Tobacco Use  . Smoking status: Never Smoker  . Smokeless tobacco: Never Used  Substance and Sexual Activity  . Alcohol use: Yes    Alcohol/week: 0.0 - 2.0 standard drinks    Comment: 1-2 beer weekly  . Drug use: No  . Sexual activity: Yes    Partners: Male    Birth control/protection: Surgical, Post-menopausal    Comment: INTERCOUSE AGE 22, SEXUAL PARTNERS MORE THAN 5  Lifestyle  . Physical activity    Days per week: Not on file    Minutes per session: Not on file  . Stress: Not on file  Relationships  . Social Herbalist on phone: Not on file    Gets together: Not on file    Attends religious service: Not on file    Active member of club or organization: Not on file    Attends meetings of clubs or organizations: Not on file    Relationship status:  Not on file  Other Topics Concern  . Not on file  Social History Narrative  . Not on file     Family History: The patient's family history includes Allergic rhinitis in her mother; Colon polyps in her mother; Crohn's disease in her cousin; Diabetes in her maternal grandfather, maternal grandmother, and mother; Hyperlipidemia in her father; Pancreatic cancer in her father; Stroke in her maternal grandmother and paternal grandmother; Uterine cancer in her mother. There is no history of Colon cancer, Stomach cancer, Esophageal cancer, Rectal cancer, Liver cancer, Eczema, Urticaria, or Asthma. ROS:   Please see the history of present illness.    All 14 point review of systems negative except as described per history of present illness  EKGs/Labs/Other Studies Reviewed:      Recent Labs: 06/22/2018: BUN 12; Creat 1.04; Hemoglobin 13.4; Platelets 282; Potassium 3.6; Sodium 139 08/10/2018: ALT 14; TSH 1.74  Recent Lipid Panel    Component Value Date/Time   CHOL 190 08/10/2018 0959   TRIG 111 08/10/2018 0959   HDL 76 08/10/2018 0959   CHOLHDL 2.5 08/10/2018 0959   VLDL 22 07/26/2016 0914   LDLCALC 93 08/10/2018 0959    Physical Exam:    VS:  BP 100/60   Pulse 79   Ht 5\' 2"  (1.575 m)   Wt 197 lb (89.4 kg)   LMP  (LMP Unknown)   SpO2 99%   BMI 36.03 kg/m     Wt Readings from Last 3 Encounters:  12/27/18 197 lb (89.4 kg)  09/20/18 189 lb (85.7 kg)  08/22/18 191 lb (86.6 kg)     GEN:  Well nourished, well developed in no acute distress HEENT: Normal NECK: No JVD; No carotid bruits LYMPHATICS: No lymphadenopathy CARDIAC: RRR, no murmurs, no rubs, no gallops RESPIRATORY:  Clear to auscultation without rales, wheezing or rhonchi  ABDOMEN: Soft, non-tender, non-distended MUSCULOSKELETAL:  No edema; No deformity  SKIN: Warm and dry LOWER EXTREMITIES: no swelling NEUROLOGIC:  Alert and oriented x 3 PSYCHIATRIC:  Normal affect   ASSESSMENT:    1. Supraventricular tachycardia  ([place])   2. OSA (obstructive sleep apnea)   3. Dyslipidemia   4. Atypical chest pain    PLAN:    In order of problems listed above:  1. Supraventricular tachycardia denies have any palpitations we will continue present management.  She is on small dose of beta-blocker 2. Obstructive sleep apnea that being followed by 10 medicine team 3. Dyslipidemia will call primary care physician to get fasting blood  profile 4. Atypical chest pain denies having any  Overall she is doing well I see her back in 1 year   Medication Adjustments/Labs and Tests Ordered: Current medicines are reviewed at length with the patient today.  Concerns regarding medicines are outlined above.  No orders of the defined types were placed in this encounter.  Medication changes: No orders of the defined types were placed in this encounter.   Signed, Park Liter, MD, [facility] 12/27/2018 9:56 AM    [place]

## 2019-01-14 ENCOUNTER — Other Ambulatory Visit: Payer: Self-pay | Admitting: Gastroenterology

## 2019-01-15 ENCOUNTER — Other Ambulatory Visit: Payer: Self-pay | Admitting: Internal Medicine

## 2019-01-15 MED ORDER — ALPRAZOLAM 0.5 MG PO TABS: 0.5000 mg | ORAL_TABLET | Freq: Every evening | ORAL | 0 refills | Status: DC | PRN

## 2019-01-15 NOTE — Telephone Encounter (Signed)
Dezra Sotolongo [phone]  Aniaya called to say that her pharmacy said they received a denial for below medication.   ALPRAZolam (XANAX) 0.5 MG tablet   [facility] [phone] - [place], [place] - [place] Phone:  [phone]  Fax:  [phone]

## 2019-01-15 NOTE — Telephone Encounter (Signed)
Xanax has to be refilled by law q 6 months and she needs OV to do this. Last seen Summer 2020

## 2019-01-15 NOTE — Telephone Encounter (Signed)
Appointment scheduled for next week, she would like to discuss maybe trying something else.

## 2019-01-23 ENCOUNTER — Ambulatory Visit: Payer: 59 | Admitting: Internal Medicine

## 2019-01-23 ENCOUNTER — Encounter: Payer: Self-pay | Admitting: Internal Medicine

## 2019-01-23 ENCOUNTER — Other Ambulatory Visit: Payer: Self-pay

## 2019-01-23 VITALS — BP 120/80 | HR 81 | Temp 98.0°F | Ht 62.0 in | Wt 186.0 lb

## 2019-01-23 DIAGNOSIS — E785 Hyperlipidemia, unspecified: Secondary | ICD-10-CM

## 2019-01-23 DIAGNOSIS — I10 Essential (primary) hypertension: Secondary | ICD-10-CM

## 2019-01-23 DIAGNOSIS — Z87898 Personal history of other specified conditions: Secondary | ICD-10-CM

## 2019-01-23 DIAGNOSIS — E1169 Type 2 diabetes mellitus with other specified complication: Secondary | ICD-10-CM

## 2019-01-23 DIAGNOSIS — E039 Hypothyroidism, unspecified: Secondary | ICD-10-CM

## 2019-01-23 DIAGNOSIS — E7800 Pure hypercholesterolemia, unspecified: Secondary | ICD-10-CM | POA: Diagnosis not present

## 2019-01-23 DIAGNOSIS — Z6834 Body mass index (BMI) 34.0-34.9, adult: Secondary | ICD-10-CM

## 2019-01-23 DIAGNOSIS — M542 Cervicalgia: Secondary | ICD-10-CM

## 2019-01-23 MED ORDER — TRIAMTERENE-HCTZ 37.5-25 MG PO TABS: 1.0000 | ORAL_TABLET | Freq: Every day | ORAL | 3 refills | Status: DC

## 2019-01-23 MED ORDER — LEVOTHYROXINE SODIUM 112 MCG PO TABS: 112.0000 ug | ORAL_TABLET | Freq: Every day | ORAL | 1 refills | Status: DC

## 2019-01-23 MED ORDER — ALPRAZOLAM 0.5 MG PO TABS: 0.5000 mg | ORAL_TABLET | Freq: Every evening | ORAL | 0 refills | Status: DC | PRN

## 2019-01-23 MED ORDER — PREDNISONE 10 MG PO TABS: ORAL_TABLET | ORAL | 0 refills | Status: DC

## 2019-01-23 MED ORDER — DOXYCYCLINE HYCLATE 100 MG PO TABS: 100.0000 mg | ORAL_TABLET | Freq: Two times a day (BID) | ORAL | 0 refills | Status: DC

## 2019-01-23 NOTE — Progress Notes (Signed)
   Subjective:    Patient ID: Rebecca Mccoy, female    DOB: [birth year], 54 y.o.   MRN: [id number]  HPI  54 year old Female for 6 month follow up.    She has hypothyroidism and migraine headaches.  History of insomnia and anxiety but that improved after she quit working full-time.  Her hemoglobin A1c is excellent at 5.4%.  TSH is normal on current dose of thyroid replacement.  Lipid panel is normal.  Continues to have issues with intermittent sinus congestion, left shoulder and neck pain, some insomnia.    Review of Systems some issues with labile blood pressure.  Which will be treated with Maxide 25     Objective:   Physical Exam Blood pressure 120/80, pulse 81, temperature 98 degrees orally pulse oximetry 98% weight 186 pounds.  BMI 34.02  Has trigger points paracervical muscle area.  Chest clear.  Cardiac exam regular rate and rhythm.  Trace lower extremity edema.      Assessment & Plan:  Neck and shoulder pain-musculoskeletal pain treat with short course of prednisone going from 40 mg to 0 mg over 5 days.  Hyperlipidemia previously treated with Crestor 5 mg 3 times a week but complained that she was having myalgias on statin medication.  Lipid panel is now normal.  Elevated BMI-needs to work on weight loss  Anxiety and depression treated with Xanax.  Will be taking Xanax at night to sleep.  Seems to have an anxiety component to her insomnia.  Hypothyroidism-TSH stable.  Continue levothyroxine 0.112 mg daily  Labile blood pressure readings-being treated with Maxide  Physical exam due June 2021

## 2019-01-24 LAB — HEPATIC FUNCTION PANEL
AG Ratio: 1.6 (calc) (ref 1.0–2.5)
ALT: 13 U/L (ref 6–29)
AST: 18 U/L (ref 10–35)
Albumin: 4.6 g/dL (ref 3.6–5.1)
Alkaline phosphatase (APISO): 73 U/L (ref 37–153)
Bilirubin, Direct: 0.2 mg/dL (ref 0.0–0.2)
Globulin: 2.8 g/dL (calc) (ref 1.9–3.7)
Indirect Bilirubin: 0.8 mg/dL (calc) (ref 0.2–1.2)
Total Bilirubin: 1 mg/dL (ref 0.2–1.2)
Total Protein: 7.4 g/dL (ref 6.1–8.1)

## 2019-01-24 LAB — LIPID PANEL
Cholesterol: 178 mg/dL (ref <200)
HDL: 66 mg/dL (ref >=50)
LDL Cholesterol (Calc): 88 mg/dL (calc)
Non-HDL Cholesterol (Calc): 112 mg/dL (calc) (ref <130)
Total CHOL/HDL Ratio: 2.7 (calc) (ref <5.0)
Triglycerides: 147 mg/dL (ref <150)

## 2019-01-24 LAB — TSH: TSH: 0.6 mIU/L

## 2019-01-27 ENCOUNTER — Other Ambulatory Visit: Payer: Self-pay | Admitting: Cardiology

## 2019-01-31 ENCOUNTER — Other Ambulatory Visit: Payer: 59 | Admitting: Internal Medicine

## 2019-01-31 ENCOUNTER — Other Ambulatory Visit: Payer: Self-pay

## 2019-01-31 DIAGNOSIS — E1169 Type 2 diabetes mellitus with other specified complication: Secondary | ICD-10-CM

## 2019-01-31 DIAGNOSIS — E039 Hypothyroidism, unspecified: Secondary | ICD-10-CM

## 2019-01-31 DIAGNOSIS — E785 Hyperlipidemia, unspecified: Secondary | ICD-10-CM

## 2019-02-01 LAB — HEMOGLOBIN A1C
Hgb A1c MFr Bld: 5.4 % of total Hgb (ref <5.7)
Mean Plasma Glucose: 108 (calc)
eAG (mmol/L): 6 (calc)

## 2019-02-26 ENCOUNTER — Telehealth: Payer: Self-pay | Admitting: Internal Medicine

## 2019-02-26 NOTE — Telephone Encounter (Signed)
Received Fax RX request from  [facility] [place], [place] - [address] [place] Phone:  [phone]  Fax:  [phone]       Medication - sertraline (ZOLOFT) 100 MG tablet    Last Refill - 01/27/19  Last OV - 01/23/19  Last CPE - 06/23/18  Next Appointment - 06/25/19

## 2019-02-26 NOTE — Telephone Encounter (Signed)
Called patient, she stated she no longer takes this medicine.

## 2019-03-11 NOTE — Patient Instructions (Addendum)
Continue to work on diet exercise and weight loss.  Lab work is within normal limits.  Follow-up in June for health maintenance exam and fasting lab work.  Take prednisone in tapering course for musculoskeletal pain.  Maxide 25 prescribed 1 p.o. daily.

## 2019-03-22 ENCOUNTER — Telehealth: Payer: Self-pay | Admitting: Internal Medicine

## 2019-03-22 ENCOUNTER — Other Ambulatory Visit: Payer: Self-pay

## 2019-03-22 MED ORDER — ALPRAZOLAM 0.5 MG PO TABS: 0.5000 mg | ORAL_TABLET | Freq: Every evening | ORAL | 1 refills | Status: DC | PRN

## 2019-03-22 NOTE — Telephone Encounter (Signed)
Refill x 6 months 

## 2019-03-22 NOTE — Telephone Encounter (Signed)
Rebecca Mccoy [phone]  [name] called to say she needs a new prescription sent to her new pharmacy for below medication. She stated when she got her last prescription from [facility] they only gave her 30 tablets.  ALPRAZolam Duanne Moron) 0.5 MG tablet  [facility] [place] [facility] in [place]

## 2019-03-22 NOTE — Telephone Encounter (Signed)
Patient requesting a refill, has CPE scheduled in June.

## 2019-04-03 ENCOUNTER — Encounter (INDEPENDENT_AMBULATORY_CARE_PROVIDER_SITE_OTHER): Payer: Self-pay | Admitting: Family Medicine

## 2019-04-03 ENCOUNTER — Other Ambulatory Visit: Payer: Self-pay

## 2019-04-03 ENCOUNTER — Ambulatory Visit (INDEPENDENT_AMBULATORY_CARE_PROVIDER_SITE_OTHER): Payer: 59 | Admitting: Family Medicine

## 2019-04-03 VITALS — BP 106/72 | HR 82 | Temp 97.8°F | Ht 62.0 in | Wt 183.0 lb

## 2019-04-03 DIAGNOSIS — Z9989 Dependence on other enabling machines and devices: Secondary | ICD-10-CM

## 2019-04-03 DIAGNOSIS — N951 Menopausal and female climacteric states: Secondary | ICD-10-CM

## 2019-04-03 DIAGNOSIS — Z9189 Other specified personal risk factors, not elsewhere classified: Secondary | ICD-10-CM | POA: Diagnosis not present

## 2019-04-03 DIAGNOSIS — R0602 Shortness of breath: Secondary | ICD-10-CM

## 2019-04-03 DIAGNOSIS — E039 Hypothyroidism, unspecified: Secondary | ICD-10-CM | POA: Diagnosis not present

## 2019-04-03 DIAGNOSIS — R5383 Other fatigue: Secondary | ICD-10-CM

## 2019-04-03 DIAGNOSIS — Z6833 Body mass index (BMI) 33.0-33.9, adult: Secondary | ICD-10-CM

## 2019-04-03 DIAGNOSIS — I10 Essential (primary) hypertension: Secondary | ICD-10-CM

## 2019-04-03 DIAGNOSIS — F3289 Other specified depressive episodes: Secondary | ICD-10-CM

## 2019-04-03 DIAGNOSIS — E669 Obesity, unspecified: Secondary | ICD-10-CM

## 2019-04-03 DIAGNOSIS — E7849 Other hyperlipidemia: Secondary | ICD-10-CM

## 2019-04-03 DIAGNOSIS — Z0289 Encounter for other administrative examinations: Secondary | ICD-10-CM

## 2019-04-03 DIAGNOSIS — G4733 Obstructive sleep apnea (adult) (pediatric): Secondary | ICD-10-CM

## 2019-04-03 NOTE — Progress Notes (Signed)
Dear Dr. Renold Genta,   Thank you for referring Rebecca Mccoy to our clinic. The following note includes my evaluation and treatment recommendations.  Chief Complaint:   OBESITY Rebecca Mccoy (MR# [id number]) is a 54 y.o. female who presents for evaluation and treatment of obesity and related comorbidities. Current BMI is Body mass index is 33.47 kg/m. Rebecca Mccoy has been struggling with her weight for many years and has been unsuccessful in either losing weight, maintaining weight loss, or reaching her healthy weight goal.  Rebecca Mccoy is currently in the action stage of change and ready to dedicate time achieving and maintaining a healthier weight. Rebecca Mccoy is interested in becoming our patient and working on intensive lifestyle modifications including (but not limited to) diet and exercise for weight loss.  Rebecca Mccoy likes [employer], low carb.  She is down 8 pounds in 3 weeks so far.  She walks 2 miles per day, no strength training, but she is working in the yard.  She drinks 96 ounces of water per day.  Rebecca Mccoy's habits were reviewed today and are as follows: Her family eats meals together, she thinks her family will eat healthier with her, her desired weight loss is 85 pounds, she has been heavy most of her life, she started gaining weight after pregnancies and menopause, her heaviest weight ever was 225 pounds, she is a picky eater, she craves nuts, some sweets, and popcorn, she skips breakfast and/or lunch frequently, she is frequently drinking liquids with calories and she struggles with emotional eating.  Depression Screen Rebecca Mccoy's Food and Mood (modified PHQ-9) score was 7.  Depression screen PHQ 2/9 04/03/2019  Decreased Interest 1  Down, Depressed, Hopeless 1  PHQ - 2 Score 2  Altered sleeping 1  Tired, decreased energy 2  Change in appetite 2  Feeling bad or failure about yourself  0  Trouble concentrating 0  Moving slowly or fidgety/restless 0  Suicidal thoughts 0  PHQ-9 Score 7  Difficult  doing work/chores Not difficult at all   Subjective:   1. Other fatigue Rebecca Mccoy denies daytime somnolence and denies waking up still tired. Patent has a history of symptoms of OSA.  Patient is on CPAP.  Rebecca Mccoy generally gets 7 hours of sleep per night, and states that she has generally restful sleep. Snoring is not present. Apneic episodes are present. Epworth Sleepiness Score is 1.  2. SOB (shortness of breath) on exertion Rebecca Mccoy notes increasing shortness of breath with exercising and seems to be worsening over time with weight gain. She notes getting out of breath sooner with activity than she used to. This has gotten worse recently. Linnae denies shortness of breath at rest or orthopnea.  3. Hypothyroidism, unspecified type Rebecca Mccoy takes 112 mcg daily.  Lab Results  Component Value Date   TSH 0.60 01/23/2019   4. Essential hypertension Review: taking medications as instructed, no medication side effects noted, no chest pain on exertion, no dyspnea on exertion, no swelling of ankles.  Rebecca Mccoy takes triamterene-HCTZ and metoprolol.  She is sensitive to salt.  BP Readings from Last 3 Encounters:  04/03/19 106/72  01/23/19 120/80  12/27/18 100/60   5. Other hyperlipidemia Rebecca Mccoy has hyperlipidemia and has been trying to improve her cholesterol levels with intensive lifestyle modification including a low saturated fat diet, exercise and weight loss. She denies any chest pain, claudication or myalgias.  She is taking Crestor.  Lab Results  Component Value Date   ALT 13 01/23/2019   AST 18 01/23/2019  ALKPHOS 61 12/03/2015   BILITOT 1.0 01/23/2019   Lab Results  Component Value Date   CHOL 178 01/23/2019   HDL 66 01/23/2019   LDLCALC 88 01/23/2019   TRIG 147 01/23/2019   CHOLHDL 2.7 01/23/2019   6. OSA on CPAP Rebecca Mccoy has a diagnosis of sleep apnea. She reports that she is using a CPAP regularly.   7. Vasomotor symptoms due to menopause Rebecca Mccoy is experiencing some vasomotor  symptoms.  8. Other depression, with emotional eating Rebecca Mccoy is struggling with emotional eating and using food for comfort to the extent that it is negatively impacting her health. She has been working on behavior modification techniques to help reduce her emotional eating and has been unsuccessful. She shows no sign of suicidal or homicidal ideations.  She is taking Wellbutrin and Xanax. PHQ-9 is 7.  9. At risk for complication associated with hypotension The patient is at a higher than average risk of hypotension due to weight loss.  Assessment/Plan:   1. Other fatigue Rebecca Mccoy does feel that her weight is causing her energy to be lower than it should be. Fatigue may be related to obesity, depression or many other causes. Labs will be ordered, and in the meanwhile, Rebecca Mccoy will focus on self care including making healthy food choices, increasing physical activity and focusing on stress reduction.  Orders - EKG 12-Lead - Comprehensive metabolic panel - Insulin, random  2. SOB (shortness of breath) on exertion Rebecca Mccoy does feel that she gets out of breath more easily that she used to when she exercises. Rebecca Mccoy's shortness of breath appears to be obesity related and exercise induced. She has agreed to work on weight loss and gradually increase exercise to treat her exercise induced shortness of breath. Will continue to monitor closely.  3. Hypothyroidism, unspecified type Patient with long-standing hypothyroidism, on levothyroxine therapy. She appears euthyroid. Orders and follow up as documented in patient record.  Counseling . Good thyroid control is important for overall health. Supratherapeutic thyroid levels are dangerous and will not improve weight loss results. . The correct way to take levothyroxine is fasting, with water, separated by at least 30 minutes from breakfast, and separated by more than 4 hours from calcium, iron, multivitamins, acid reflux medications (PPIs).   4. Essential  hypertension Review: taking medications as instructed, no medication side effects noted, no chest pain on exertion, no dyspnea on exertion, no swelling of ankles.   BP Readings from Last 3 Encounters:  04/03/19 106/72  01/23/19 120/80  12/27/18 100/60   5. Other hyperlipidemia Cardiovascular risk and specific lipid/LDL goals reviewed.  We discussed several lifestyle modifications today and Tearria will continue to work on diet, exercise and weight loss efforts. Orders and follow up as documented in patient record.   Counseling Intensive lifestyle modifications are the first line treatment for this issue. . Dietary changes: Increase soluble fiber. Decrease simple carbohydrates. . Exercise changes: Moderate to vigorous-intensity aerobic activity 150 minutes per week if tolerated. . Lipid-lowering medications: see documented in medical record.  6. OSA on CPAP Intensive lifestyle modifications are the first line treatment for this issue. We discussed several lifestyle modifications today and she will continue to work on diet, exercise and weight loss efforts. We will continue to monitor. Orders and follow up as documented in patient record.   Counseling  Sleep apnea is a condition in which breathing pauses or becomes shallow during sleep. This happens over and over during the night. This disrupts your sleep and keeps your body  from getting the rest that it needs, which can cause tiredness and lack of energy (fatigue) during the day.  Sleep apnea treatment: If you were given a device to open your airway while you sleep, USE IT!  Sleep hygiene:   Limit or avoid alcohol, caffeinated beverages, and cigarettes, especially close to bedtime.   Do not eat a large meal or eat spicy foods right before bedtime. This can lead to digestive discomfort that can make it hard for you to sleep.  Keep a sleep diary to help you and your health care provider figure out what could be causing your insomnia.   . Make your bedroom a dark, comfortable place where it is easy to fall asleep. ? Put up shades or blackout curtains to block light from outside. ? Use a white noise machine to block noise. ? Keep the temperature cool. . Limit screen use before bedtime. This includes: ? Watching TV. ? Using your smartphone, tablet, or computer. . Stick to a routine that includes going to bed and waking up at the same times every day and night. This can help you fall asleep faster. Consider making a quiet activity, such as reading, part of your nighttime routine. . Try to avoid taking naps during the day so that you sleep better at night. . Get out of bed if you are still awake after 15 minutes of trying to sleep. Keep the lights down, but try reading or doing a quiet activity. When you feel sleepy, go back to bed.  7. Vasomotor symptoms due to menopause Will check labs today.  Orders - Follicle stimulating hormone  8. Other depression, with emotional eating Behavior modification techniques were discussed today to help Ahley deal with her emotional/non-hunger eating behaviors.  Orders and follow up as documented in patient record.   9. At risk for complication associated with hypotension Tawny was given approximately 15 minutes of education and counseling today to help avoid hypotension. We discussed risks of hypotension with weight loss and signs of hypotension such as feeling lightheaded or unsteady.  Repetitive spaced learning was employed today to elicit superior memory formation and behavioral change.  10. Class 1 obesity with serious comorbidity and body mass index (BMI) of 33.0 to 33.9 in adult, unspecified obesity type Dinara is currently in the action stage of change and her goal is to continue with weight loss efforts. I recommend Asie begin the structured treatment plan as follows:  She has agreed to the Category 1 Plan.  Exercise goals: As is.   Behavioral modification strategies:  increasing lean protein intake, decreasing simple carbohydrates, increasing vegetables, increasing water intake and decreasing liquid calories.  She was informed of the importance of frequent follow-up visits to maximize her success with intensive lifestyle modifications for her multiple health conditions. She was informed we would discuss her lab results at her next visit unless there is a critical issue that needs to be addressed sooner. Camyra agreed to keep her next visit at the agreed upon time to discuss these results.  Objective:   Blood pressure 106/72, pulse 82, temperature 97.8 F (36.6 C), temperature source Oral, height 5\' 2"  (1.575 m), weight 183 lb (83 kg), SpO2 100 %. Body mass index is 33.47 kg/m.  EKG: Normal sinus rhythm, rate 92 bpm.  Indirect Calorimeter completed today shows a VO2 of 183 and a REE of 1277.  Her calculated basal metabolic rate is [id number] thus her basal metabolic rate is worse than expected.  General: Cooperative, alert,  well developed, in no acute distress. HEENT: Conjunctivae and lids unremarkable. Cardiovascular: Regular rhythm.  Lungs: Normal work of breathing. Neurologic: No focal deficits.   Lab Results  Component Value Date   CREATININE 1.04 06/22/2018   BUN 12 06/22/2018   NA 139 06/22/2018   K 3.6 06/22/2018   CL 99 06/22/2018   CO2 28 06/22/2018   Lab Results  Component Value Date   ALT 13 01/23/2019   AST 18 01/23/2019   ALKPHOS 61 12/03/2015   BILITOT 1.0 01/23/2019   Lab Results  Component Value Date   HGBA1C 5.4 01/31/2019   HGBA1C 5.5 08/10/2018   HGBA1C 5.2 07/30/2016   Lab Results  Component Value Date   TSH 0.60 01/23/2019   Lab Results  Component Value Date   CHOL 178 01/23/2019   HDL 66 01/23/2019   LDLCALC 88 01/23/2019   TRIG 147 01/23/2019   CHOLHDL 2.7 01/23/2019   Lab Results  Component Value Date   WBC 5.6 06/22/2018   HGB 13.4 06/22/2018   HCT 39.3 06/22/2018   MCV 91.6 06/22/2018   PLT 282  06/22/2018   Attestation Statements:   This is the patient's first visit at [facility]. The patient's NEW PATIENT PACKET was reviewed at length. Included in the packet: current and past health history, medications, allergies, ROS, gynecologic history (women only), surgical history, family history, social history, weight history, weight loss surgery history (for those that have had weight loss surgery), nutritional evaluation, mood and food questionnaire, PHQ9, Epworth questionnaire, sleep habits questionnaire, patient life and health improvement goals questionnaire. These will all be scanned into the patient's chart under media.   During the visit, I independently reviewed the patient's EKG, bioimpedance scale results, and indirect calorimeter results. I used this information to tailor a meal plan for the patient that will help her to lose weight and will improve her obesity-related conditions going forward. I performed a medically necessary appropriate examination and/or evaluation. I discussed the assessment and treatment plan with the patient. The patient was provided an opportunity to ask questions and all were answered. The patient agreed with the plan and demonstrated an understanding of the instructions. Labs were ordered at this visit and will be reviewed at the next visit unless more critical results need to be addressed immediately. Clinical information was updated and documented in the EMR.   I, Water quality scientist, CMA, am acting as Location manager for [employer], DO.  I have reviewed the above documentation for accuracy and completeness, and I agree with the above. Briscoe Deutscher, DO

## 2019-04-04 LAB — COMPREHENSIVE METABOLIC PANEL
ALT: 17 IU/L (ref 0–32)
AST: 20 IU/L (ref 0–40)
Albumin/Globulin Ratio: 1.9 (ref 1.2–2.2)
Albumin: 4.9 g/dL (ref 3.8–4.9)
Alkaline Phosphatase: 79 IU/L (ref 39–117)
BUN/Creatinine Ratio: 20 (ref 9–23)
BUN: 19 mg/dL (ref 6–24)
Bilirubin Total: 0.8 mg/dL (ref 0.0–1.2)
CO2: 26 mmol/L (ref 20–29)
Calcium: 10.3 mg/dL — ABNORMAL HIGH (ref 8.7–10.2)
Chloride: 99 mmol/L (ref 96–106)
Creatinine, Ser: 0.95 mg/dL (ref 0.57–1.00)
GFR calc Af Amer: 79 mL/min/{1.73_m2} (ref >59)
GFR calc non Af Amer: 69 mL/min/{1.73_m2} (ref >59)
Globulin, Total: 2.6 g/dL (ref 1.5–4.5)
Glucose: 87 mg/dL (ref 65–99)
Potassium: 4.4 mmol/L (ref 3.5–5.2)
Sodium: 140 mmol/L (ref 134–144)
Total Protein: 7.5 g/dL (ref 6.0–8.5)

## 2019-04-04 LAB — FOLLICLE STIMULATING HORMONE: FSH: 101 m[IU]/mL

## 2019-04-04 LAB — INSULIN, RANDOM: INSULIN: 10.7 u[IU]/mL (ref 2.6–24.9)

## 2019-04-17 ENCOUNTER — Other Ambulatory Visit: Payer: Self-pay

## 2019-04-17 ENCOUNTER — Encounter (INDEPENDENT_AMBULATORY_CARE_PROVIDER_SITE_OTHER): Payer: Self-pay | Admitting: Family Medicine

## 2019-04-17 ENCOUNTER — Ambulatory Visit (INDEPENDENT_AMBULATORY_CARE_PROVIDER_SITE_OTHER): Payer: 59 | Admitting: Family Medicine

## 2019-04-17 VITALS — BP 100/69 | HR 83 | Temp 98.0°F | Ht 63.0 in | Wt 182.0 lb

## 2019-04-17 DIAGNOSIS — N951 Menopausal and female climacteric states: Secondary | ICD-10-CM | POA: Diagnosis not present

## 2019-04-17 DIAGNOSIS — E039 Hypothyroidism, unspecified: Secondary | ICD-10-CM

## 2019-04-17 DIAGNOSIS — Z9189 Other specified personal risk factors, not elsewhere classified: Secondary | ICD-10-CM | POA: Diagnosis not present

## 2019-04-17 DIAGNOSIS — I10 Essential (primary) hypertension: Secondary | ICD-10-CM

## 2019-04-17 DIAGNOSIS — E669 Obesity, unspecified: Secondary | ICD-10-CM

## 2019-04-17 DIAGNOSIS — E8881 Metabolic syndrome: Secondary | ICD-10-CM

## 2019-04-17 DIAGNOSIS — Z6833 Body mass index (BMI) 33.0-33.9, adult: Secondary | ICD-10-CM

## 2019-04-18 MED ORDER — METFORMIN HCL 500 MG PO TABS: 500.0000 mg | ORAL_TABLET | Freq: Every day | ORAL | 0 refills | Status: DC

## 2019-04-18 NOTE — Progress Notes (Signed)
Chief Complaint:   OBESITY Rebecca Mccoy is here to discuss her progress with her obesity treatment plan along with follow-up of her obesity related diagnoses. Rebecca Mccoy is on the Category 1 Plan and states she is following her eating plan approximately 100% of the time. Rebecca Mccoy states she is using the rowing machine for 20 minutes 3-4 times per week and walking for 30 minutes 6-7 times per week.  Today's visit was #: 2 Starting weight: 183 lbs Starting date: 04/03/2019 Today's weight: 182 lbs Today's date: 04/17/2019 Total lbs lost to date: 1 lb Total lbs lost since last in-office visit: 1 lb  Interim History: Marga says she was able to follow the plan well.  She made great decisions when she went out.  She feels that her waist is smaller, and she had to make her bra tighter.  She has more energy.  She likes eating real food as compared to [place].  Subjective:   1. Hypothyroidism, unspecified type Rebecca Mccoy is taking levothyroxine 112 mcg daily.   Lab Results  Component Value Date   TSH 0.60 01/23/2019   2. Essential hypertension Review: taking medications as instructed, no medication side effects noted, no chest pain on exertion, no dyspnea on exertion, no swelling of ankles.  She is taking metoprolol and Maxzide for blood pressure.  BP Readings from Last 3 Encounters:  04/17/19 100/69  04/03/19 106/72  01/23/19 120/80   3. Insulin resistance Genever has a diagnosis of insulin resistance based on her elevated fasting insulin level >5. She continues to work on diet and exercise to decrease her risk of diabetes.  Lab Results  Component Value Date   INSULIN 10.7 04/03/2019   Lab Results  Component Value Date   HGBA1C 5.4 01/31/2019   4. Vasomotor symptoms due to menopause Rebecca Mccoy had an elevated FSH, in the postmenopausal range.  5. At risk for complication associated with hypotension The patient is at a higher than average risk of hypotension due to weight loss.  Assessment/Plan:    1. Hypothyroidism, unspecified type Patient with long-standing hypothyroidism, on levothyroxine therapy. She appears euthyroid. Orders and follow up as documented in patient record.  Counseling . Good thyroid control is important for overall health. Supratherapeutic thyroid levels are dangerous and will not improve weight loss results. . The correct way to take levothyroxine is fasting, with water, separated by at least 30 minutes from breakfast, and separated by more than 4 hours from calcium, iron, multivitamins, acid reflux medications (PPIs).   2. Essential hypertension Janne is working on healthy weight loss and exercise to improve blood pressure control. We will watch for signs of hypotension as she continues her lifestyle modifications.  3. Insulin resistance Rebecca Mccoy will continue to work on weight loss, exercise, and decreasing simple carbohydrates to help decrease the risk of diabetes. Rebecca Mccoy agreed to follow-up with [place] as directed to closely monitor her progress. - metFORMIN (GLUCOPHAGE) 500 MG tablet; Take 1 tablet (500 mg total) by mouth daily.  Dispense: 30 tablet; Refill: 0  4. Vasomotor symptoms due to menopause Will continue to monitor.    5. At risk for complication associated with hypotension Rebecca Mccoy was given approximately 15 minutes of education and counseling today to help avoid hypotension. We discussed risks of hypotension with weight loss and signs of hypotension such as feeling lightheaded or unsteady.  Repetitive spaced learning was employed today to elicit superior memory formation and behavioral change.  6. Class 1 obesity with serious comorbidity and body mass index (  BMI) of 33.0 to 33.9 in adult, unspecified obesity type Rebecca Mccoy is currently in the action stage of change. As such, her goal is to continue with weight loss efforts. She has agreed to the Category 1 Plan.   Exercise goals: As is.  Behavioral modification strategies: increasing lean protein intake and  increasing water intake.  Rebecca Mccoy has agreed to follow-up with our clinic in 2 weeks. She was informed of the importance of frequent follow-up visits to maximize her success with intensive lifestyle modifications for her multiple health conditions.   Objective:   Blood pressure 100/69, pulse 83, temperature 98 F (36.7 C), temperature source Oral, height 5\' 3"  (1.6 m), weight 182 lb (82.6 kg), SpO2 97 %. Body mass index is 32.24 kg/m.  General: Cooperative, alert, well developed, in no acute distress. HEENT: Conjunctivae and lids unremarkable. Cardiovascular: Regular rhythm.  Lungs: Normal work of breathing. Neurologic: No focal deficits.   Lab Results  Component Value Date   CREATININE 0.95 04/03/2019   BUN 19 04/03/2019   NA 140 04/03/2019   K 4.4 04/03/2019   CL 99 04/03/2019   CO2 26 04/03/2019   Lab Results  Component Value Date   ALT 17 04/03/2019   AST 20 04/03/2019   ALKPHOS 79 04/03/2019   BILITOT 0.8 04/03/2019   Lab Results  Component Value Date   HGBA1C 5.4 01/31/2019   HGBA1C 5.5 08/10/2018   HGBA1C 5.2 07/30/2016   Lab Results  Component Value Date   INSULIN 10.7 04/03/2019   Lab Results  Component Value Date   TSH 0.60 01/23/2019   Lab Results  Component Value Date   CHOL 178 01/23/2019   HDL 66 01/23/2019   LDLCALC 88 01/23/2019   TRIG 147 01/23/2019   CHOLHDL 2.7 01/23/2019   Lab Results  Component Value Date   WBC 5.6 06/22/2018   HGB 13.4 06/22/2018   HCT 39.3 06/22/2018   MCV 91.6 06/22/2018   PLT 282 06/22/2018   Attestation Statements:   Reviewed by clinician on day of visit: allergies, medications, problem list, medical history, surgical history, family history, social history, and previous encounter notes.  I, Water quality scientist, CMA, am acting as Location manager for [employer], DO.  I have reviewed the above documentation for accuracy and completeness, and I agree with the above. Briscoe Deutscher, DO

## 2019-05-07 ENCOUNTER — Other Ambulatory Visit: Payer: Self-pay

## 2019-05-07 ENCOUNTER — Ambulatory Visit (INDEPENDENT_AMBULATORY_CARE_PROVIDER_SITE_OTHER): Payer: 59 | Admitting: Family Medicine

## 2019-05-07 ENCOUNTER — Encounter (INDEPENDENT_AMBULATORY_CARE_PROVIDER_SITE_OTHER): Payer: Self-pay | Admitting: Family Medicine

## 2019-05-07 VITALS — BP 93/68 | HR 84 | Temp 98.0°F | Ht 63.0 in | Wt 177.0 lb

## 2019-05-07 DIAGNOSIS — E8881 Metabolic syndrome: Secondary | ICD-10-CM

## 2019-05-07 DIAGNOSIS — J301 Allergic rhinitis due to pollen: Secondary | ICD-10-CM | POA: Diagnosis not present

## 2019-05-07 DIAGNOSIS — E669 Obesity, unspecified: Secondary | ICD-10-CM

## 2019-05-07 DIAGNOSIS — E039 Hypothyroidism, unspecified: Secondary | ICD-10-CM

## 2019-05-07 DIAGNOSIS — Z6831 Body mass index (BMI) 31.0-31.9, adult: Secondary | ICD-10-CM

## 2019-05-07 DIAGNOSIS — I10 Essential (primary) hypertension: Secondary | ICD-10-CM

## 2019-05-10 NOTE — Progress Notes (Signed)
Chief Complaint:   OBESITY Rebecca Mccoy is here to discuss her progress with her obesity treatment plan along with follow-up of her obesity related diagnoses. Rebecca Mccoy is on the Category 1 Plan and states she is following her eating plan approximately 100% of the time. Rebecca Mccoy states she is walking for 60 minutes and using the rowing machine for 30 minutes 5 times per week.  Today's visit was #: 3 Starting weight: 183 lbs Starting date: 04/03/2019 Today's weight: 177 lbs Today's date: 05/07/2019 Total lbs lost to date: 6 lbs Total lbs lost since last in-office visit: 5 lbs  Interim History: Rebecca Mccoy says she has been walking a lot daily.  She is drinking 80 ounces of water per day.  Subjective:   1. Insulin resistance Rebecca Mccoy has a diagnosis of insulin resistance based on her elevated fasting insulin level >5. She continues to work on diet and exercise to decrease her risk of diabetes.  She has had some headaches.  Possible mild dehydration.  Lab Results  Component Value Date   INSULIN 10.7 04/03/2019   Lab Results  Component Value Date   HGBA1C 5.4 01/31/2019   2. Essential hypertension Review: taking medications as instructed, no medication side effects noted, no chest pain on exertion, no dyspnea on exertion, no swelling of ankles.  She is taking Toprol and Maxzide for blood pressure.  BP Readings from Last 3 Encounters:  05/07/19 93/68  04/17/19 100/69  04/03/19 106/72   3. Hypothyroidism, unspecified type Rebecca Mccoy is taking levothyroxine 112 mcg daily.  Lab Results  Component Value Date   TSH 0.60 01/23/2019   4. Allergic rhinitis due to pollen [name] is using Flonase and Zyrtec for her allergies.  Assessment/Plan:   1. Insulin resistance Hold metformin for the time being.  2. Essential hypertension Czarina will decrease Maxzide to 1/2 tablet due to blood pressure being 93/68 today.  3. Hypothyroidism, unspecified type Patient with long-standing hypothyroidism, on  levothyroxine therapy. She appears euthyroid. Orders and follow up as documented in patient record.  Counseling . Good thyroid control is important for overall health. Supratherapeutic thyroid levels are dangerous and will not improve weight loss results. . The correct way to take levothyroxine is fasting, with water, separated by at least 30 minutes from breakfast, and separated by more than 4 hours from calcium, iron, multivitamins, acid reflux medications (PPIs).   4. Allergic rhinitis due to pollen We will continue to monitor. Orders and follow up as documented in patient record.  5. Class 1 obesity with serious comorbidity and body mass index (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, unspecified obesity type Rebecca Mccoy is currently in the action stage of change. As such, her goal is to continue with weight loss efforts. She has agreed to the Category 1 Plan.   Exercise goals: For substantial health benefits, adults should do at least 150 minutes (2 hours and 30 minutes) a week of moderate-intensity, or 75 minutes (1 hour and 15 minutes) a week of vigorous-intensity aerobic physical activity, or an equivalent combination of moderate- and vigorous-intensity aerobic activity. Aerobic activity should be performed in episodes of at least 10 minutes, and preferably, it should be spread throughout the week.  Behavioral modification strategies: increasing water intake.  Avarey has agreed to follow-up with our clinic in 2 weeks. She was informed of the importance of frequent follow-up visits to maximize her success with intensive lifestyle modifications for her multiple health conditions.   Objective:   Blood pressure 93/68, pulse 84,  temperature 98 F (36.7 C), temperature source Oral, height 5\' 3"  (1.6 m), weight 177 lb (80.3 kg), SpO2 99 %. Body mass index is 31.35 kg/m.  General: Cooperative, alert, well developed, in no acute distress. HEENT: Conjunctivae and lids unremarkable. Cardiovascular: Regular  rhythm.  Lungs: Normal work of breathing. Neurologic: No focal deficits.   Lab Results  Component Value Date   CREATININE 0.95 04/03/2019   BUN 19 04/03/2019   NA 140 04/03/2019   K 4.4 04/03/2019   CL 99 04/03/2019   CO2 26 04/03/2019   Lab Results  Component Value Date   ALT 17 04/03/2019   AST 20 04/03/2019   ALKPHOS 79 04/03/2019   BILITOT 0.8 04/03/2019   Lab Results  Component Value Date   HGBA1C 5.4 01/31/2019   HGBA1C 5.5 08/10/2018   HGBA1C 5.2 07/30/2016   Lab Results  Component Value Date   INSULIN 10.7 04/03/2019   Lab Results  Component Value Date   TSH 0.60 01/23/2019   Lab Results  Component Value Date   CHOL 178 01/23/2019   HDL 66 01/23/2019   LDLCALC 88 01/23/2019   TRIG 147 01/23/2019   CHOLHDL 2.7 01/23/2019   Lab Results  Component Value Date   WBC 5.6 06/22/2018   HGB 13.4 06/22/2018   HCT 39.3 06/22/2018   MCV 91.6 06/22/2018   PLT 282 06/22/2018   Attestation Statements:   Reviewed by clinician on day of visit: allergies, medications, problem list, medical history, surgical history, family history, social history, and previous encounter notes.  I, Water quality scientist, CMA, am acting as Location manager for [employer], DO.  I have reviewed the above documentation for accuracy and completeness, and I agree with the above. Briscoe Deutscher, DO

## 2019-05-11 ENCOUNTER — Encounter (INDEPENDENT_AMBULATORY_CARE_PROVIDER_SITE_OTHER): Payer: Self-pay | Admitting: Family Medicine

## 2019-05-28 ENCOUNTER — Other Ambulatory Visit: Payer: Self-pay | Admitting: Internal Medicine

## 2019-05-28 ENCOUNTER — Encounter (INDEPENDENT_AMBULATORY_CARE_PROVIDER_SITE_OTHER): Payer: Self-pay | Admitting: Family Medicine

## 2019-05-28 ENCOUNTER — Ambulatory Visit (INDEPENDENT_AMBULATORY_CARE_PROVIDER_SITE_OTHER): Payer: 59 | Admitting: Family Medicine

## 2019-05-28 ENCOUNTER — Other Ambulatory Visit: Payer: Self-pay

## 2019-05-28 VITALS — BP 108/75 | HR 88 | Temp 98.5°F | Ht 63.0 in | Wt 175.0 lb

## 2019-05-28 DIAGNOSIS — E669 Obesity, unspecified: Secondary | ICD-10-CM | POA: Diagnosis not present

## 2019-05-28 DIAGNOSIS — E8881 Metabolic syndrome: Secondary | ICD-10-CM | POA: Diagnosis not present

## 2019-05-28 DIAGNOSIS — Z9189 Other specified personal risk factors, not elsewhere classified: Secondary | ICD-10-CM | POA: Diagnosis not present

## 2019-05-28 DIAGNOSIS — F3289 Other specified depressive episodes: Secondary | ICD-10-CM | POA: Diagnosis not present

## 2019-05-28 DIAGNOSIS — Z6831 Body mass index (BMI) 31.0-31.9, adult: Secondary | ICD-10-CM

## 2019-05-28 MED ORDER — METFORMIN HCL 500 MG PO TABS: 500.0000 mg | ORAL_TABLET | Freq: Every day | ORAL | 0 refills | Status: DC

## 2019-05-28 NOTE — Telephone Encounter (Signed)
Spoke with patient she said she does not need this medication, Please disregard.

## 2019-05-28 NOTE — Progress Notes (Signed)
Chief Complaint:   OBESITY Rebecca Mccoy is here to discuss her progress with her obesity treatment plan along with follow-up of her obesity related diagnoses. Rebecca Mccoy is on the Category 1 Plan and states she is following her eating plan approximately 99% of the time. Rebecca Mccoy states she is walking 2 miles and rowing 5 times per week.  Today's visit was #: 4 Starting weight: 183 lbs Starting date: 04/03/2019 Today's weight: 175 lbs Today's date: 05/28/2019 Total lbs lost to date: 8 lbs Total lbs lost since last in-office visit: 2 lbs  Interim History: Rebecca Mccoy says that she has had a stressful week but it is improving.  Subjective:   1. Insulin resistance Rebecca Mccoy has a diagnosis of insulin resistance based on her elevated fasting insulin level >5. She continues to work on diet and exercise to decrease her risk of diabetes.  She is taking metformin 500 mg daily.  Lab Results  Component Value Date   INSULIN 10.7 04/03/2019   Lab Results  Component Value Date   HGBA1C 5.4 01/31/2019   2. Metabolic syndrome Risk factors (3 or more constitute Metabolic Syndrome): impaired fasting blood sugar on blood pressure medication on cholesterol medication.  3. Other depression, with emotional eating Rebecca Mccoy is struggling with emotional eating and using food for comfort to the extent that it is negatively impacting her health. She has been working on behavior modification techniques to help reduce her emotional eating and has been successful. She shows no sign of suicidal or homicidal ideations.  Assessment/Plan:   1. Insulin resistance Rebecca Mccoy will continue to work on weight loss, exercise, and decreasing simple carbohydrates to help decrease the risk of diabetes. Monti agreed to follow-up with [place] as directed to closely monitor her progress.  Orders - metFORMIN (GLUCOPHAGE) 500 MG tablet; Take 1 tablet (500 mg total) by mouth daily.  Dispense: 30 tablet; Refill: 0  2. Metabolic syndrome Counseling:  Intensive lifestyle modifications are the first line treatment for this issue. We discussed several lifestyle modifications today and she will continue to work on diet, exercise and weight loss efforts. We will continue to monitor. Orders and follow up as documented in patient record.  Counseling  Metabolic syndrome is a combination of conditions such as abdominal obesity, high blood sugar, high blood pressure, and unhealthy levels of cholesterol. Metabolic syndrome raises your risk of developing heart disease, diabetes, and stroke.  Following a heart-healthy diet can help you lower your risk of heart disease and improve metabolic syndrome. This diet includes lean proteins, healthy fats, nuts, and plenty of fruits and vegetables.  Along with regular exercise, following a healthy diet can help your body use insulin better and help you lose weight.  A common goal for people with metabolic syndrome is to lose 7-10% of their starting weight.  3. Other depression, with emotional eating Behavior modification techniques were discussed today to help Rebecca Mccoy deal with her emotional/non-hunger eating behaviors.  Orders and follow up as documented in patient record.   4. At risk for heart disease Rebecca Mccoy was given approximately 15 minutes of coronary artery disease prevention counseling today. She is 54 y.o. female and has risk factors for heart disease including obesity. We discussed intensive lifestyle modifications today with an emphasis on specific weight loss instructions and strategies.   Repetitive spaced learning was employed today to elicit superior memory formation and behavioral change.  5. Class 1 obesity with serious comorbidity and body mass index (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, unspecified  obesity type Rebecca Mccoy is currently in the action stage of change. As such, her goal is to continue with weight loss efforts. She has agreed to the Category 1 Plan.   Exercise goals: For substantial health  benefits, adults should do at least 150 minutes (2 hours and 30 minutes) a week of moderate-intensity, or 75 minutes (1 hour and 15 minutes) a week of vigorous-intensity aerobic physical activity, or an equivalent combination of moderate- and vigorous-intensity aerobic activity. Aerobic activity should be performed in episodes of at least 10 minutes, and preferably, it should be spread throughout the week.  Behavioral modification strategies: increasing lean protein intake and increasing water intake.  Rebecca Mccoy has agreed to follow-up with our clinic in 2 weeks. She was informed of the importance of frequent follow-up visits to maximize her success with intensive lifestyle modifications for her multiple health conditions.   Objective:   Blood pressure 108/75, pulse 88, temperature 98.5 F (36.9 C), temperature source Oral, height 5\' 3"  (1.6 m), weight 175 lb (79.4 kg), SpO2 98 %. Body mass index is 31 kg/m.  General: Cooperative, alert, well developed, in no acute distress. HEENT: Conjunctivae and lids unremarkable. Cardiovascular: Regular rhythm.  Lungs: Normal work of breathing. Neurologic: No focal deficits.   Lab Results  Component Value Date   CREATININE 0.95 04/03/2019   BUN 19 04/03/2019   NA 140 04/03/2019   K 4.4 04/03/2019   CL 99 04/03/2019   CO2 26 04/03/2019   Lab Results  Component Value Date   ALT 17 04/03/2019   AST 20 04/03/2019   ALKPHOS 79 04/03/2019   BILITOT 0.8 04/03/2019   Lab Results  Component Value Date   HGBA1C 5.4 01/31/2019   HGBA1C 5.5 08/10/2018   HGBA1C 5.2 07/30/2016   Lab Results  Component Value Date   INSULIN 10.7 04/03/2019   Lab Results  Component Value Date   TSH 0.60 01/23/2019   Lab Results  Component Value Date   CHOL 178 01/23/2019   HDL 66 01/23/2019   LDLCALC 88 01/23/2019   TRIG 147 01/23/2019   CHOLHDL 2.7 01/23/2019   Lab Results  Component Value Date   WBC 5.6 06/22/2018   HGB 13.4 06/22/2018   HCT 39.3  06/22/2018   MCV 91.6 06/22/2018   PLT 282 06/22/2018   Attestation Statements:   Reviewed by clinician on day of visit: allergies, medications, problem list, medical history, surgical history, family history, social history, and previous encounter notes.  I, Water quality scientist, CMA, am acting as Location manager for [employer], DO.  I have reviewed the above documentation for accuracy and completeness, and I agree with the above. Briscoe Deutscher, DO

## 2019-05-28 NOTE — Telephone Encounter (Signed)
Fax received from [facility] on [place] Montelukast 10mg  tablets Last filled 01/27/19 Last OV: 01/23/19 Last CPE: 06/23/18 Next CPE: 06/25/19

## 2019-05-29 LAB — COMPREHENSIVE METABOLIC PANEL
ALT: 13 IU/L (ref 0–32)
AST: 20 IU/L (ref 0–40)
Albumin/Globulin Ratio: 2 (ref 1.2–2.2)
Albumin: 4.7 g/dL (ref 3.8–4.9)
Alkaline Phosphatase: 82 IU/L (ref 39–117)
BUN/Creatinine Ratio: 12 (ref 9–23)
BUN: 12 mg/dL (ref 6–24)
Bilirubin Total: 0.9 mg/dL (ref 0.0–1.2)
CO2: 25 mmol/L (ref 20–29)
Calcium: 9.8 mg/dL (ref 8.7–10.2)
Chloride: 103 mmol/L (ref 96–106)
Creatinine, Ser: 1.04 mg/dL — ABNORMAL HIGH (ref 0.57–1.00)
GFR calc Af Amer: 71 mL/min/{1.73_m2} (ref >59)
GFR calc non Af Amer: 61 mL/min/{1.73_m2} (ref >59)
Globulin, Total: 2.4 g/dL (ref 1.5–4.5)
Glucose: 90 mg/dL (ref 65–99)
Potassium: 4.2 mmol/L (ref 3.5–5.2)
Sodium: 142 mmol/L (ref 134–144)
Total Protein: 7.1 g/dL (ref 6.0–8.5)

## 2019-05-29 LAB — CBC WITH DIFFERENTIAL/PLATELET
Basophils Absolute: 0 10*3/uL (ref 0.0–0.2)
Basos: 1 %
EOS (ABSOLUTE): 0.1 10*3/uL (ref 0.0–0.4)
Eos: 2 %
Hemoglobin: 12.6 g/dL (ref 11.1–15.9)
Immature Grans (Abs): 0 10*3/uL (ref 0.0–0.1)
Immature Granulocytes: 0 %
Lymphocytes Absolute: 2.1 10*3/uL (ref 0.7–3.1)
Lymphs: 44 %
MCH: 31 pg (ref 26.6–33.0)
MCHC: 32.8 g/dL (ref 31.5–35.7)
MCV: 94 fL (ref 79–97)
Monocytes Absolute: 0.3 10*3/uL (ref 0.1–0.9)
Monocytes: 6 %
Neutrophils Absolute: 2.3 10*3/uL (ref 1.4–7.0)
Neutrophils: 47 %
Platelets: 297 10*3/uL (ref 150–450)
RBC: 4.07 x10E6/uL (ref 3.77–5.28)
RDW: 13.3 % (ref 11.7–15.4)
WBC: 4.9 10*3/uL (ref 3.4–10.8)

## 2019-05-29 LAB — ANEMIA PANEL
Ferritin: 36 ng/mL (ref 15–150)
Folate, Hemolysate: 573 ng/mL
Folate, RBC: 1492 ng/mL (ref >498)
Hematocrit: 38.4 % (ref 34.0–46.6)
Iron Saturation: 30 % (ref 15–55)
Iron: 108 ug/dL (ref 27–159)
Retic Ct Pct: 1.3 % (ref 0.6–2.6)
Total Iron Binding Capacity: 363 ug/dL (ref 250–450)
UIBC: 255 ug/dL (ref 131–425)
Vitamin B-12: 536 pg/mL (ref 232–1245)

## 2019-05-29 LAB — VITAMIN D 25 HYDROXY (VIT D DEFICIENCY, FRACTURES): Vit D, 25-Hydroxy: 67.3 ng/mL (ref 30.0–100.0)

## 2019-06-20 ENCOUNTER — Ambulatory Visit (INDEPENDENT_AMBULATORY_CARE_PROVIDER_SITE_OTHER): Payer: 59 | Admitting: Family Medicine

## 2019-06-20 ENCOUNTER — Encounter (INDEPENDENT_AMBULATORY_CARE_PROVIDER_SITE_OTHER): Payer: Self-pay | Admitting: Family Medicine

## 2019-06-20 ENCOUNTER — Other Ambulatory Visit: Payer: Self-pay

## 2019-06-20 VITALS — BP 95/63 | HR 72 | Temp 98.3°F | Ht 63.0 in | Wt 176.0 lb

## 2019-06-20 DIAGNOSIS — E8881 Metabolic syndrome: Secondary | ICD-10-CM | POA: Diagnosis not present

## 2019-06-20 DIAGNOSIS — E559 Vitamin D deficiency, unspecified: Secondary | ICD-10-CM | POA: Diagnosis not present

## 2019-06-20 DIAGNOSIS — K5901 Slow transit constipation: Secondary | ICD-10-CM | POA: Diagnosis not present

## 2019-06-20 DIAGNOSIS — Z6831 Body mass index (BMI) 31.0-31.9, adult: Secondary | ICD-10-CM

## 2019-06-20 DIAGNOSIS — Z9189 Other specified personal risk factors, not elsewhere classified: Secondary | ICD-10-CM | POA: Diagnosis not present

## 2019-06-20 DIAGNOSIS — I10 Essential (primary) hypertension: Secondary | ICD-10-CM | POA: Diagnosis not present

## 2019-06-20 DIAGNOSIS — E669 Obesity, unspecified: Secondary | ICD-10-CM

## 2019-06-20 NOTE — Progress Notes (Signed)
Chief Complaint:   OBESITY Rebecca Mccoy is here to discuss her progress with her obesity treatment plan along with follow-up of her obesity related diagnoses. Rebecca Mccoy is on the Category 1 Plan and states she is following her eating plan approximately 90% of the time. Rebecca Mccoy states she is walking 1/2-2 miles 5 times per week.  Today's visit was #: 5 Starting weight: 183 lbs Starting date: 04/03/2019 Today's weight: 176 lbs Today's date: 06/20/2019 Total lbs lost to date: 176 lbs Total lbs lost since last in-office visit: 1 lb  Interim History: Rebecca Mccoy says she has had a stressful few weeks.  She is planning a motorcycle trip.  She is up 1 pound of fluid today.  Subjective:   1. Essential hypertension Review: taking medications as instructed, no medication side effects noted, no chest pain on exertion, no dyspnea on exertion, no swelling of ankles.  Blood pressure is low today.   BP Readings from Last 3 Encounters:  06/20/19 95/63  05/28/19 108/75  05/07/19 93/68   2. Insulin resistance Rebecca Mccoy has a diagnosis of insulin resistance based on her elevated fasting insulin level >5. She continues to work on diet and exercise to decrease her risk of diabetes.  Lab Results  Component Value Date   INSULIN 10.7 04/03/2019   Lab Results  Component Value Date   HGBA1C 5.4 01/31/2019   3. Vitamin D deficiency Rebecca Mccoy's Vitamin D level was 67.3 on 05/28/2019. She is currently taking OTC vitamin D 2000 IU each day. She denies nausea, vomiting or muscle weakness.  Vitamin D level is at goal.  4. Slow transit constipation Rebecca Mccoy says she has increased fiber (taking 2 capsules) and has added a probiotic.  5. At risk for heart disease Rebecca Mccoy is at a higher than average risk for cardiovascular disease due to obesity.   Assessment/Plan:   1. Essential hypertension Rebecca Mccoy is working on healthy weight loss and exercise to improve blood pressure control. We will watch for signs of hypotension as she  continues her lifestyle modifications.  Rebecca Mccoy will decrease metoprolol to 12.5 mg daily.  Orders - metoprolol succinate (TOPROL-XL) 25 MG 24 hr tablet; Take 0.5 tablets (12.5 mg total) by mouth daily.  Dispense: 30 tablet; Refill: 0  2. Insulin resistance Anaih will continue to work on weight loss, exercise, and decreasing simple carbohydrates to help decrease the risk of diabetes. Rebecca Mccoy agreed to follow-up with [place] as directed to closely monitor her progress.  Will increase metformin to twice daily dosing.  Orders - metFORMIN (GLUCOPHAGE) 500 MG tablet; Take 1 tablet (500 mg total) by mouth 2 (two) times daily.  Dispense: 60 tablet; Refill: 0  3. Vitamin D deficiency Low Vitamin D level contributes to fatigue and are associated with obesity, breast, and colon cancer. She agrees to continue to take OTC Vitamin D @2 ,000 IU daily and will follow-up for routine testing of Vitamin D, at least 2-3 times per year to avoid over-replacement.  4. Slow transit constipation Will see if increase in metformin changes this, but she can also try MiraLAX as needed.  5. At risk for heart disease Rebecca Mccoy was given approximately 15 minutes of coronary artery disease prevention counseling today. She is 54 y.o. female and has risk factors for heart disease including obesity. We discussed intensive lifestyle modifications today with an emphasis on specific weight loss instructions and strategies.   Repetitive spaced learning was employed today to elicit superior memory formation and behavioral change.  6. Class 1 obesity with  serious comorbidity and body mass index (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, unspecified obesity type Katerine is currently in the action stage of change. As such, her goal is to continue with weight loss efforts. She has agreed to the Category 1 Plan.   Exercise goals: For substantial health benefits, adults should do at least 150 minutes (2 hours and 30 minutes) a week of moderate-intensity, or 75  minutes (1 hour and 15 minutes) a week of vigorous-intensity aerobic physical activity, or an equivalent combination of moderate- and vigorous-intensity aerobic activity. Aerobic activity should be performed in episodes of at least 10 minutes, and preferably, it should be spread throughout the week.  Behavioral modification strategies: increasing lean protein intake and increasing water intake.  Rebecca Mccoy has agreed to follow-up with our clinic in 2 weeks. She was informed of the importance of frequent follow-up visits to maximize her success with intensive lifestyle modifications for her multiple health conditions.   Objective:   Blood pressure 95/63, pulse 72, temperature 98.3 F (36.8 C), temperature source Oral, height 5\' 3"  (1.6 m), weight 176 lb (79.8 kg), SpO2 99 %. Body mass index is 31.18 kg/m.  General: Cooperative, alert, well developed, in no acute distress. HEENT: Conjunctivae and lids unremarkable. Cardiovascular: Regular rhythm.  Lungs: Normal work of breathing. Neurologic: No focal deficits.   Lab Results  Component Value Date   CREATININE 1.04 (H) 05/28/2019   BUN 12 05/28/2019   NA 142 05/28/2019   K 4.2 05/28/2019   CL 103 05/28/2019   CO2 25 05/28/2019   Lab Results  Component Value Date   ALT 13 05/28/2019   AST 20 05/28/2019   ALKPHOS 82 05/28/2019   BILITOT 0.9 05/28/2019   Lab Results  Component Value Date   HGBA1C 5.4 01/31/2019   HGBA1C 5.5 08/10/2018   HGBA1C 5.2 07/30/2016   Lab Results  Component Value Date   INSULIN 10.7 04/03/2019   Lab Results  Component Value Date   TSH 0.60 01/23/2019   Lab Results  Component Value Date   CHOL 178 01/23/2019   HDL 66 01/23/2019   LDLCALC 88 01/23/2019   TRIG 147 01/23/2019   CHOLHDL 2.7 01/23/2019   Lab Results  Component Value Date   WBC 4.9 05/28/2019   HGB 12.6 05/28/2019   HCT 38.4 05/28/2019   MCV 94 05/28/2019   PLT 297 05/28/2019   Lab Results  Component Value Date   IRON 108  05/28/2019   TIBC 363 05/28/2019   FERRITIN 36 05/28/2019   Attestation Statements:   Reviewed by clinician on day of visit: allergies, medications, problem list, medical history, surgical history, family history, social history, and previous encounter notes.  I, Water quality scientist, CMA, am acting as transcriptionist for Briscoe Deutscher, DO  I have reviewed the above documentation for accuracy and completeness, and I agree with the above. Briscoe Deutscher, DO

## 2019-06-21 MED ORDER — METFORMIN HCL 500 MG PO TABS: 500.0000 mg | ORAL_TABLET | Freq: Two times a day (BID) | ORAL | 0 refills | Status: DC

## 2019-06-21 MED ORDER — METOPROLOL SUCCINATE ER 25 MG PO TB24: 12.5000 mg | ORAL_TABLET | Freq: Every day | ORAL | 0 refills | Status: DC

## 2019-06-22 ENCOUNTER — Other Ambulatory Visit: Payer: Self-pay

## 2019-06-22 ENCOUNTER — Other Ambulatory Visit: Payer: 59 | Admitting: Internal Medicine

## 2019-06-22 DIAGNOSIS — E039 Hypothyroidism, unspecified: Secondary | ICD-10-CM

## 2019-06-22 DIAGNOSIS — F32A Depression, unspecified: Secondary | ICD-10-CM

## 2019-06-22 DIAGNOSIS — I471 Supraventricular tachycardia: Secondary | ICD-10-CM

## 2019-06-22 DIAGNOSIS — E1169 Type 2 diabetes mellitus with other specified complication: Secondary | ICD-10-CM

## 2019-06-22 DIAGNOSIS — Z0000 Encounter for general adult medical examination without abnormal findings: Secondary | ICD-10-CM

## 2019-06-22 DIAGNOSIS — I10 Essential (primary) hypertension: Secondary | ICD-10-CM

## 2019-06-22 DIAGNOSIS — E7800 Pure hypercholesterolemia, unspecified: Secondary | ICD-10-CM

## 2019-06-22 DIAGNOSIS — E785 Hyperlipidemia, unspecified: Secondary | ICD-10-CM

## 2019-06-23 LAB — COMPLETE METABOLIC PANEL WITH GFR
AG Ratio: 1.8 (calc) (ref 1.0–2.5)
ALT: 10 U/L (ref 6–29)
AST: 15 U/L (ref 10–35)
Albumin: 4.7 g/dL (ref 3.6–5.1)
Alkaline phosphatase (APISO): 74 U/L (ref 37–153)
BUN: 15 mg/dL (ref 7–25)
CO2: 33 mmol/L — ABNORMAL HIGH (ref 20–32)
Calcium: 9.8 mg/dL (ref 8.6–10.4)
Chloride: 101 mmol/L (ref 98–110)
Creat: 1 mg/dL (ref 0.50–1.05)
GFR, Est African American: 74 mL/min/{1.73_m2} (ref >=60)
GFR, Est Non African American: 64 mL/min/{1.73_m2} (ref >=60)
Globulin: 2.6 g/dL (calc) (ref 1.9–3.7)
Glucose, Bld: 88 mg/dL (ref 65–99)
Potassium: 4.6 mmol/L (ref 3.5–5.3)
Sodium: 140 mmol/L (ref 135–146)
Total Bilirubin: 0.7 mg/dL (ref 0.2–1.2)
Total Protein: 7.3 g/dL (ref 6.1–8.1)

## 2019-06-23 LAB — CBC WITH DIFFERENTIAL/PLATELET
Absolute Monocytes: 389 cells/uL (ref 200–950)
Basophils Absolute: 22 cells/uL (ref 0–200)
Basophils Relative: 0.4 %
Eosinophils Absolute: 119 cells/uL (ref 15–500)
Eosinophils Relative: 2.2 %
HCT: 39.5 % (ref 35.0–45.0)
Hemoglobin: 12.9 g/dL (ref 11.7–15.5)
Lymphs Abs: 2025 cells/uL (ref 850–3900)
MCH: 30.4 pg (ref 27.0–33.0)
MCHC: 32.7 g/dL (ref 32.0–36.0)
MCV: 93.2 fL (ref 80.0–100.0)
MPV: 10.1 fL (ref 7.5–12.5)
Monocytes Relative: 7.2 %
Neutro Abs: 2846 cells/uL (ref 1500–7800)
Neutrophils Relative %: 52.7 %
Platelets: 293 10*3/uL (ref 140–400)
RBC: 4.24 10*6/uL (ref 3.80–5.10)
RDW: 13 % (ref 11.0–15.0)
Total Lymphocyte: 37.5 %
WBC: 5.4 10*3/uL (ref 3.8–10.8)

## 2019-06-23 LAB — LIPID PANEL
Cholesterol: 184 mg/dL (ref <200)
HDL: 73 mg/dL (ref >=50)
LDL Cholesterol (Calc): 89 mg/dL (calc)
Non-HDL Cholesterol (Calc): 111 mg/dL (calc) (ref <130)
Total CHOL/HDL Ratio: 2.5 (calc) (ref <5.0)
Triglycerides: 122 mg/dL (ref <150)

## 2019-06-23 LAB — HEMOGLOBIN A1C
Hgb A1c MFr Bld: 5.3 % of total Hgb (ref <5.7)
Mean Plasma Glucose: 105 (calc)
eAG (mmol/L): 5.8 (calc)

## 2019-06-23 LAB — TSH: TSH: 1.76 mIU/L

## 2019-06-25 ENCOUNTER — Ambulatory Visit: Payer: 59 | Admitting: Internal Medicine

## 2019-06-25 ENCOUNTER — Encounter: Payer: Self-pay | Admitting: Internal Medicine

## 2019-06-25 ENCOUNTER — Other Ambulatory Visit: Payer: Self-pay

## 2019-06-25 VITALS — BP 130/90 | HR 66 | Ht 63.0 in | Wt 178.0 lb

## 2019-06-25 DIAGNOSIS — Z87898 Personal history of other specified conditions: Secondary | ICD-10-CM

## 2019-06-25 DIAGNOSIS — Z0000 Encounter for general adult medical examination without abnormal findings: Secondary | ICD-10-CM

## 2019-06-25 DIAGNOSIS — E039 Hypothyroidism, unspecified: Secondary | ICD-10-CM | POA: Diagnosis not present

## 2019-06-25 DIAGNOSIS — G4733 Obstructive sleep apnea (adult) (pediatric): Secondary | ICD-10-CM

## 2019-06-25 DIAGNOSIS — M542 Cervicalgia: Secondary | ICD-10-CM

## 2019-06-25 DIAGNOSIS — Z9989 Dependence on other enabling machines and devices: Secondary | ICD-10-CM

## 2019-06-25 DIAGNOSIS — Z8659 Personal history of other mental and behavioral disorders: Secondary | ICD-10-CM

## 2019-06-25 DIAGNOSIS — I10 Essential (primary) hypertension: Secondary | ICD-10-CM

## 2019-06-25 DIAGNOSIS — E8881 Metabolic syndrome: Secondary | ICD-10-CM

## 2019-06-25 DIAGNOSIS — Z8614 Personal history of Methicillin resistant Staphylococcus aureus infection: Secondary | ICD-10-CM

## 2019-06-25 DIAGNOSIS — R61 Generalized hyperhidrosis: Secondary | ICD-10-CM

## 2019-06-25 DIAGNOSIS — Z8669 Personal history of other diseases of the nervous system and sense organs: Secondary | ICD-10-CM

## 2019-06-25 DIAGNOSIS — E88819 Insulin resistance, unspecified: Secondary | ICD-10-CM

## 2019-06-25 DIAGNOSIS — R7302 Impaired glucose tolerance (oral): Secondary | ICD-10-CM

## 2019-06-25 DIAGNOSIS — E7800 Pure hypercholesterolemia, unspecified: Secondary | ICD-10-CM

## 2019-06-25 LAB — POCT URINALYSIS DIPSTICK
Appearance: NEGATIVE
Bilirubin, UA: NEGATIVE
Blood, UA: NEGATIVE
Glucose, UA: NEGATIVE
Ketones, UA: NEGATIVE
Leukocytes, UA: NEGATIVE
Nitrite, UA: NEGATIVE
Odor: NEGATIVE
Protein, UA: NEGATIVE
Spec Grav, UA: 1.01 (ref 1.010–1.025)
Urobilinogen, UA: 0.2 E.U./dL
pH, UA: 7.5 (ref 5.0–8.0)

## 2019-06-25 NOTE — Progress Notes (Signed)
Subjective:    Patient ID: Rebecca Mccoy, female    DOB: [birth year], 54 y.o.   MRN: [id number]  HPI 54 year old Female for health maintenance exam and evaluation of medical issues. Recent cerumen impaction which she removed with candling.  In 2020 was referred to Cardiology for substernal chest pain with history of tachycardia in the past.  Holter monitor for 5 days showed infrequent PVCs and infrequent premature supraventricular beats with 3 runs of narrow complex tachycardia.  Longest episode was 13 beats at 162 bpm.  Had normal gated myocardial perfusion study.  Being seen at [facility]. Has lost about 15 pounds in one year.  Has been unable to tolerate Crestor 3 times a week.  She says it caused myalgias.  History of familial hyperlipidemia.  She has hypothyroidism.  History of migraine headaches.  History of insomnia and anxiety but says this has improved since she quit working full-time.  Had colonoscopy December 2017.  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  History of recurrent MRSA infections.  Husband also has recurrent MRSA infections.  History of obstructive sleep apnea.  History of bacterial vaginosis and Candida vaginitis.  History of chondromalacia right knee in 2014.  History of cellulitis right thigh 2014.  History of right distal phalanx second toe fracture.  She had exercise tolerance test 2018 for chest pain which was negative.  Sclerotherapy for varicose veins in 2004.  History of allergic rhinitis, GE reflux.  History of menorrhagia due to fibroids in 2006.  History of bilateral carpal tunnel syndrome status post surgical release.  History of hypertension.  IUD inserted in 2008.  Sebaceous cyst on her back removed 2008.  History of syncope in 2008 referred for echocardiogram and event monitor.  No subsequent issues with syncope.  Tonsillectomy 1972.  Bilateral tubal ligation 1979.  Abdominoplasty 2008.  Breast augmentation 2010.  Right  carpal tunnel release February 2010.  Left carpal tunnel release March 2011.  Mirena IUD placed 2013.  History of anxiety disorder previously seen at behavioral [place] [employer] which is stable.  History of palpitations.  No known drug allergies.      Review of Systems history of nonallergic rhinitis seen by allergist with negative indoor and outdoor panels.  History of migraine headaches.  History of anxiety and insomnia. Currently having left neck pain.    Objective:   Physical Exam BP 130/90 pulse 66 weight 178 pounds BMI 31.53 Skin warm and dry. Nodes none. Tender over left SCM muscle consistent with spasm TMs are clear.  Neck is supple without JVD thyromegaly or carotid bruits.  Chest clear to auscultation.  Cardiac exam regular rate and rhythm normal S1 and S2.  Abdomen soft nondistended without hepatosplenomegaly masses or tenderness bilateral breast implants.  Pap done in 2018.  She will see gynecologist later this year.  Bimanual not done.  Neuro intact without focal deficits.  Trace lower extremity edema.      Assessment & Plan:  Spasm Left SCM muscle-treatment ice topically and anti-inflammatory medication if needed.  Referring to PT to work spasm out of neck.  Negative cardiology work-up last year with chest pain and palpitations  Hypothyroidism-stable on current dose of thyroid replacement  Hyperlipidemia does not want to take Crestor causes myalgias.  Currently lipid panel is normal.  Obesity-continue to be seen with Dr. Leafy Ro  Nonallergic rhinitis seen by allergist and allergy panels were negative.  Can refill Singulair.  History of  migraine headaches  Anxiety and insomnia improved when she retired from her job  History of recurrent MRSA infections  History of sleep apnea  History of hyperhidrosis treated with Robinul  History of impaired glucose tolerance treated with Metformin  History of lower extremity edema treated with Maxide  25  Palpitations treated with Toprol-XL 12.5 mg daily which also treats labile blood pressure    Plan: She will see GYN later this year.  Continue current medications as previously prescribed and follow-up in 1 year or as needed.

## 2019-06-25 NOTE — Patient Instructions (Signed)
It was a pleasure to see you today. Referring to PT for left neck spasm. RTC in one year or as needed. Congratulations on weight loss. Continue [facility].

## 2019-07-16 ENCOUNTER — Other Ambulatory Visit: Payer: Self-pay

## 2019-07-16 ENCOUNTER — Ambulatory Visit (INDEPENDENT_AMBULATORY_CARE_PROVIDER_SITE_OTHER): Payer: 59 | Admitting: Family Medicine

## 2019-07-16 ENCOUNTER — Encounter (INDEPENDENT_AMBULATORY_CARE_PROVIDER_SITE_OTHER): Payer: Self-pay | Admitting: Family Medicine

## 2019-07-16 VITALS — BP 94/65 | HR 81 | Temp 98.0°F | Ht 63.0 in | Wt 174.0 lb

## 2019-07-16 DIAGNOSIS — E8881 Metabolic syndrome: Secondary | ICD-10-CM | POA: Diagnosis not present

## 2019-07-16 DIAGNOSIS — Z6830 Body mass index (BMI) 30.0-30.9, adult: Secondary | ICD-10-CM

## 2019-07-16 DIAGNOSIS — I10 Essential (primary) hypertension: Secondary | ICD-10-CM | POA: Diagnosis not present

## 2019-07-16 DIAGNOSIS — E669 Obesity, unspecified: Secondary | ICD-10-CM

## 2019-07-16 DIAGNOSIS — E559 Vitamin D deficiency, unspecified: Secondary | ICD-10-CM | POA: Diagnosis not present

## 2019-07-16 DIAGNOSIS — Z9189 Other specified personal risk factors, not elsewhere classified: Secondary | ICD-10-CM

## 2019-07-16 MED ORDER — METFORMIN HCL 500 MG PO TABS: 500.0000 mg | ORAL_TABLET | Freq: Two times a day (BID) | ORAL | 0 refills | Status: DC

## 2019-07-16 NOTE — Progress Notes (Signed)
Chief Complaint:   OBESITY Rebecca Mccoy is here to discuss her progress with her obesity treatment plan along with follow-up of her obesity related diagnoses. Rebecca Mccoy is on the Category 1 Plan and states she is following her eating plan approximately 90% of the time. Rebecca Mccoy states she is rowing for 30 minutes 1-2 times per week and walking 1 mile per day.  Today's visit was #: 6 Starting weight: 183 lbs Starting date: 04/03/2019 Today's weight: 174 lbs Today's date: 07/16/2019 Total lbs lost to date: 9 lbs Total lbs lost since last in-office visit: 0  Interim History: Rebecca Mccoy is currently in PT for a spasm in her left neck, upper trap, and periscapular muscle.  She will be leaving on Friday for vacation/motorcycle trip.  Subjective:   1. Insulin resistance Denisse has a diagnosis of insulin resistance based on her elevated fasting insulin level >5. She continues to work on diet and exercise to decrease her risk of diabetes.  Lab Results  Component Value Date   INSULIN 10.7 04/03/2019   Lab Results  Component Value Date   HGBA1C 5.3 06/22/2019   2. Vitamin D deficiency Rebecca Mccoy's Vitamin D level was 67.3 on 05/28/2019.  At goal.  She is currently taking OTC vitamin D 2000 IU each day. She denies nausea, vomiting or muscle weakness.  3. Essential hypertension Review: taking medications as instructed, no medication side effects noted, no chest pain on exertion, no dyspnea on exertion, no swelling of ankles.  Blood pressure is low today but blood pressure at home is within normal limits.  Home SBP 116-119.  No CP, SOB, dizziness, or orthostasis.  BP Readings from Last 3 Encounters:  07/16/19 94/65  06/25/19 130/90  06/20/19 95/63   Assessment/Plan:   1. Insulin resistance Nalani will continue to work on weight loss, exercise, and decreasing simple carbohydrates to help decrease the risk of diabetes. Nirel agreed to follow-up with [place] as directed to closely monitor her progress.  Orders -  metFORMIN (GLUCOPHAGE) 500 MG tablet; Take 1 tablet (500 mg total) by mouth 2 (two) times daily.  Dispense: 60 tablet; Refill: 0  2. Vitamin D deficiency Low Vitamin D level contributes to fatigue and are associated with obesity, breast, and colon cancer. She agrees to continue to take OTC Vitamin D @2 ,000 IU daily and will follow-up for routine testing of Vitamin D, at least 2-3 times per year to avoid over-replacement.  3. Essential hypertension Rebecca Mccoy is working on healthy weight loss and exercise to improve blood pressure control. We will watch for signs of hypotension as she continues her lifestyle modifications.  4. At risk for heart disease Rebecca Mccoy was given approximately 15 minutes of coronary artery disease prevention counseling today. She is 54 y.o. female and has risk factors for heart disease including obesity. We discussed intensive lifestyle modifications today with an emphasis on specific weight loss instructions and strategies.   Repetitive spaced learning was employed today to elicit superior memory formation and behavioral change.  5. Class 1 obesity with serious comorbidity and body mass index (BMI) of 30.0 to 30.9 in adult, unspecified obesity type Rebecca Mccoy is currently in the action stage of change. As such, her goal is to continue with weight loss efforts. She has agreed to the Category 1 Plan.   Exercise goals: For substantial health benefits, adults should do at least 150 minutes (2 hours and 30 minutes) a week of moderate-intensity, or 75 minutes (1 hour and 15 minutes) a week of vigorous-intensity aerobic  physical activity, or an equivalent combination of moderate- and vigorous-intensity aerobic activity. Aerobic activity should be performed in episodes of at least 10 minutes, and preferably, it should be spread throughout the week.  Behavioral modification strategies: increasing lean protein intake and increasing water intake.  Rebecca Mccoy has agreed to follow-up with our clinic in  4 weeks. She was informed of the importance of frequent follow-up visits to maximize her success with intensive lifestyle modifications for her multiple health conditions.   Objective:   Blood pressure 94/65, pulse 81, temperature 98 F (36.7 C), temperature source Oral, height 5\' 3"  (1.6 m), weight 174 lb (78.9 kg), SpO2 98 %. Body mass index is 30.82 kg/m.  General: Cooperative, alert, well developed, in no acute distress. HEENT: Conjunctivae and lids unremarkable. Cardiovascular: Regular rhythm.  Lungs: Normal work of breathing. Neurologic: No focal deficits.   Lab Results  Component Value Date   CREATININE 1.00 06/22/2019   BUN 15 06/22/2019   NA 140 06/22/2019   K 4.6 06/22/2019   CL 101 06/22/2019   CO2 33 (H) 06/22/2019   Lab Results  Component Value Date   ALT 10 06/22/2019   AST 15 06/22/2019   ALKPHOS 82 05/28/2019   BILITOT 0.7 06/22/2019   Lab Results  Component Value Date   HGBA1C 5.3 06/22/2019   HGBA1C 5.4 01/31/2019   HGBA1C 5.5 08/10/2018   HGBA1C 5.2 07/30/2016   Lab Results  Component Value Date   INSULIN 10.7 04/03/2019   Lab Results  Component Value Date   TSH 1.76 06/22/2019   Lab Results  Component Value Date   CHOL 184 06/22/2019   HDL 73 06/22/2019   LDLCALC 89 06/22/2019   TRIG 122 06/22/2019   CHOLHDL 2.5 06/22/2019   Lab Results  Component Value Date   WBC 5.4 06/22/2019   HGB 12.9 06/22/2019   HCT 39.5 06/22/2019   MCV 93.2 06/22/2019   PLT 293 06/22/2019   Lab Results  Component Value Date   IRON 108 05/28/2019   TIBC 363 05/28/2019   FERRITIN 36 05/28/2019   Attestation Statements:   Reviewed by clinician on day of visit: allergies, medications, problem list, medical history, surgical history, family history, social history, and previous encounter notes.  I, Water quality scientist, CMA, am acting as transcriptionist for Briscoe Deutscher, DO  I have reviewed the above documentation for accuracy and completeness, and I agree  with the above. Briscoe Deutscher, DO

## 2019-08-13 ENCOUNTER — Ambulatory Visit (INDEPENDENT_AMBULATORY_CARE_PROVIDER_SITE_OTHER): Payer: 59 | Admitting: Family Medicine

## 2019-08-14 ENCOUNTER — Other Ambulatory Visit: Payer: Self-pay

## 2019-08-14 ENCOUNTER — Ambulatory Visit (INDEPENDENT_AMBULATORY_CARE_PROVIDER_SITE_OTHER): Payer: 59 | Admitting: Bariatrics

## 2019-08-14 ENCOUNTER — Encounter (INDEPENDENT_AMBULATORY_CARE_PROVIDER_SITE_OTHER): Payer: Self-pay | Admitting: Bariatrics

## 2019-08-14 VITALS — BP 114/81 | HR 81 | Temp 97.9°F | Ht 63.0 in | Wt 175.0 lb

## 2019-08-14 DIAGNOSIS — Z9189 Other specified personal risk factors, not elsewhere classified: Secondary | ICD-10-CM

## 2019-08-14 DIAGNOSIS — E8881 Metabolic syndrome: Secondary | ICD-10-CM | POA: Diagnosis not present

## 2019-08-14 DIAGNOSIS — E669 Obesity, unspecified: Secondary | ICD-10-CM

## 2019-08-14 DIAGNOSIS — I10 Essential (primary) hypertension: Secondary | ICD-10-CM | POA: Diagnosis not present

## 2019-08-14 DIAGNOSIS — Z6831 Body mass index (BMI) 31.0-31.9, adult: Secondary | ICD-10-CM

## 2019-08-14 DIAGNOSIS — E661 Drug-induced obesity: Secondary | ICD-10-CM

## 2019-08-14 DIAGNOSIS — K5909 Other constipation: Secondary | ICD-10-CM | POA: Diagnosis not present

## 2019-08-14 HISTORY — DX: Drug-induced obesity: E66.1

## 2019-08-14 NOTE — Progress Notes (Signed)
Chief Complaint:   OBESITY Rebecca Mccoy is here to discuss her progress with her obesity treatment plan along with follow-up of her obesity related diagnoses. Rebecca Mccoy is on the Category 1 Plan and states she is following her eating plan approximately 95% of the time. Rebecca Mccoy states she is walking 2-3 miles a day 4 times per week.  Today's visit was #: 7 Starting weight: 183 Starting date: 04/03/2019 Today's weight: 175 lbs Today's date: 08/14/2019 Total lbs lost to date: 8 Total lbs lost since last in-office visit: 0  Interim History: Rebecca Mccoy is up 1 lb, but has done well overall. She has been on vacation. She reports getting enough water, but maybe not enough protein.  Subjective:   Essential hypertension. Chariah is taking Toprol XL and Maxzide-25. Blood pressure is controlled.  BP Readings from Last 3 Encounters:  08/14/19 114/81  07/16/19 94/65  06/25/19 130/90   Lab Results  Component Value Date   CREATININE 1.00 06/22/2019   CREATININE 1.04 (H) 05/28/2019   CREATININE 0.95 04/03/2019   Insulin resistance. Rebecca Mccoy has a diagnosis of insulin resistance based on her elevated fasting insulin level >5. She continues to work on diet and exercise to decrease her risk of diabetes. No polyphagia.  Lab Results  Component Value Date   INSULIN 10.7 04/03/2019   Lab Results  Component Value Date   HGBA1C 5.3 06/22/2019   Other constipation. Rebecca Mccoy is taking metformin and takes Miralax occasionally. She denies retained stool or blood in stool.  At risk for hypoglycemia. Rebecca Mccoy is at increased risk for hypoglycemia due to changes in diet, diagnosis of diabetes, and/or insulin use.   Assessment/Plan:   Essential hypertension. Koda is working on healthy weight loss and exercise to improve blood pressure control. We will watch for signs of hypotension as she continues her lifestyle modifications. She will continue her medications as directed.  Insulin resistance. Keviana will continue  to work on weight loss, exercise, increasing healthy fats and protein, and decreasing simple carbohydrates to help decrease the risk of diabetes. She will eat small, frequent meals and will decrease her caffeine intake. Rebecca Mccoy agreed to follow-up with [place] as directed to closely monitor her progress.  Other constipation. Rebecca Mccoy was informed that a decrease in bowel movement frequency is normal while losing weight, but stools should not be hard or painful. Orders and follow up as documented in patient record. Rebecca Mccoy will take Miralax daily, Citracal Sugar with water, and a fiber supplement (oat bran, flaxseed).   Counseling Getting to Good Bowel Health: Your goal is to have one soft bowel movement each day. Drink at least 8 glasses of water each day. Eat plenty of fiber (goal is over 25 grams each day). It is best to get most of your fiber from dietary sources which includes leafy green vegetables, fresh fruit, and whole grains. You may need to add fiber with the help of OTC fiber supplements. These include Metamucil, Citrucel, and Flaxseed. If you are still having trouble, try adding Miralax or Magnesium Citrate. If all of these changes do not work, Cabin crew.  At risk for hypoglycemia. Rebecca Mccoy was given approximately 15 minutes of counseling today regarding prevention of hypoglycemia. She was advised of symptoms of hypoglycemia. Rebecca Mccoy was instructed to avoid skipping meals, eat regular protein rich meals and schedule low calorie snacks as needed.   Repetitive spaced learning was employed today to elicit superior memory formation and behavioral change.  Class 1 obesity with serious comorbidity  and body mass index (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, unspecified obesity type.  Rebecca Mccoy is currently in the action stage of change. As such, her goal is to continue with weight loss efforts. She has agreed to the Category 1 Plan.   She will work on meal planning, intentional eating, and increasing her water  intake.   Exercise goals: All adults should avoid inactivity. Some physical activity is better than none, and adults who participate in any amount of physical activity gain some health benefits.  Behavioral modification strategies: increasing lean protein intake, decreasing simple carbohydrates, increasing vegetables, increasing water intake, decreasing eating out, no skipping meals, meal planning and cooking strategies, keeping healthy foods in the home and planning for success.  Rebecca Mccoy has agreed to follow-up with our clinic in 2-3 weeks. She was informed of the importance of frequent follow-up visits to maximize her success with intensive lifestyle modifications for her multiple health conditions.   Objective:   Blood pressure 114/81, pulse 81, temperature 97.9 F (36.6 C), height 5\' 3"  (1.6 m), weight 175 lb (79.4 kg), SpO2 98 %. Body mass index is 31 kg/m.  General: Cooperative, alert, well developed, in no acute distress. HEENT: Conjunctivae and lids unremarkable. Cardiovascular: Regular rhythm.  Lungs: Normal work of breathing. Neurologic: No focal deficits.   Lab Results  Component Value Date   CREATININE 1.00 06/22/2019   BUN 15 06/22/2019   NA 140 06/22/2019   K 4.6 06/22/2019   CL 101 06/22/2019   CO2 33 (H) 06/22/2019   Lab Results  Component Value Date   ALT 10 06/22/2019   AST 15 06/22/2019   ALKPHOS 82 05/28/2019   BILITOT 0.7 06/22/2019   Lab Results  Component Value Date   HGBA1C 5.3 06/22/2019   HGBA1C 5.4 01/31/2019   HGBA1C 5.5 08/10/2018   HGBA1C 5.2 07/30/2016   Lab Results  Component Value Date   INSULIN 10.7 04/03/2019   Lab Results  Component Value Date   TSH 1.76 06/22/2019   Lab Results  Component Value Date   CHOL 184 06/22/2019   HDL 73 06/22/2019   LDLCALC 89 06/22/2019   TRIG 122 06/22/2019   CHOLHDL 2.5 06/22/2019   Lab Results  Component Value Date   WBC 5.4 06/22/2019   HGB 12.9 06/22/2019   HCT 39.5 06/22/2019   MCV  93.2 06/22/2019   PLT 293 06/22/2019   Lab Results  Component Value Date   IRON 108 05/28/2019   TIBC 363 05/28/2019   FERRITIN 36 05/28/2019   Attestation Statements:   Reviewed by clinician on day of visit: allergies, medications, problem list, medical history, surgical history, family history, social history, and previous encounter notes.  Migdalia Dk, am acting as Location manager for [employer], DO   I have reviewed the above documentation for accuracy and completeness, and I agree with the above. Jearld Lesch, DO

## 2019-08-30 ENCOUNTER — Other Ambulatory Visit (INDEPENDENT_AMBULATORY_CARE_PROVIDER_SITE_OTHER): Payer: Self-pay | Admitting: Family Medicine

## 2019-08-30 DIAGNOSIS — E8881 Metabolic syndrome: Secondary | ICD-10-CM

## 2019-09-04 ENCOUNTER — Ambulatory Visit (INDEPENDENT_AMBULATORY_CARE_PROVIDER_SITE_OTHER): Payer: 59 | Admitting: Family Medicine

## 2019-09-04 ENCOUNTER — Encounter (INDEPENDENT_AMBULATORY_CARE_PROVIDER_SITE_OTHER): Payer: Self-pay | Admitting: Family Medicine

## 2019-09-04 ENCOUNTER — Other Ambulatory Visit: Payer: Self-pay

## 2019-09-04 VITALS — BP 107/79 | HR 69 | Temp 98.1°F | Ht 63.0 in | Wt 173.0 lb

## 2019-09-04 DIAGNOSIS — E669 Obesity, unspecified: Secondary | ICD-10-CM

## 2019-09-04 DIAGNOSIS — E8881 Metabolic syndrome: Secondary | ICD-10-CM | POA: Diagnosis not present

## 2019-09-04 DIAGNOSIS — Z6830 Body mass index (BMI) 30.0-30.9, adult: Secondary | ICD-10-CM | POA: Diagnosis not present

## 2019-09-04 DIAGNOSIS — Z9189 Other specified personal risk factors, not elsewhere classified: Secondary | ICD-10-CM

## 2019-09-04 MED ORDER — SAXENDA 18 MG/3ML ~~LOC~~ SOPN: 3.0000 mg | PEN_INJECTOR | Freq: Every day | SUBCUTANEOUS | 0 refills | Status: DC

## 2019-09-04 MED ORDER — METFORMIN HCL 500 MG PO TABS: 500.0000 mg | ORAL_TABLET | Freq: Two times a day (BID) | ORAL | 0 refills | Status: DC

## 2019-09-04 MED ORDER — INSULIN PEN NEEDLE 32G X 4 MM MISC: 1.0000 | Freq: Every day | 0 refills | Status: DC

## 2019-09-04 NOTE — Progress Notes (Signed)
Chief Complaint:   OBESITY Rebecca Mccoy is here to discuss her progress with her obesity treatment plan along with follow-up of her obesity related diagnoses. [name] is on the Category 1 Plan and states she is following her eating plan approximately 100% of the time. [name] states she is walking for 20-30 minutes 5 times per week.  Today's visit was #: 8 Starting weight: 183 lbs Starting date: 04/03/2019 Today's weight: 173 lbs Today's date: 09/04/2019 Total lbs lost to date: 10 lbs Total lbs lost since last in-office visit: 2 lbs Total weight loss percentage to date: -5.46%  Interim History: [name] says she is going to [place] on September 16th. Her husband recently joined the program.   Assessment/Plan:   1. Insulin resistance Goal is HgbA1c < 5.7 and insulin level closer to 5. [name] will continue to work on weight loss, exercise, and decreasing simple carbohydrates to help decrease the risk of diabetes. [name] agreed to follow-up with [place] as directed to closely monitor her progress.  - metFORMIN (GLUCOPHAGE) 500 MG tablet; Take 1 tablet (500 mg total) by mouth 2 (two) times daily.  Dispense: 60 tablet; Refill: 0  2. At risk for heart disease [name] was given approximately 15 minutes of coronary artery disease prevention counseling today. She is 54 y.o. female and has risk factors for heart disease including obesity and IR. We discussed intensive lifestyle modifications today with an emphasis on specific weight loss instructions and strategies.   During insulin resistance, several metabolic alterations induce the development of cardiovascular disease. For instance, insulin resistance can induce an imbalance in glucose metabolism that generates chronic hyperglycemia, which in turn triggers oxidative stress and causes an inflammatory response that leads to cell damage. Insulin resistance can also alter systemic lipid metabolism which then leads to the development of dyslipidemia and the well-known  lipid triad: (1) high levels of plasma triglycerides, (2) low levels of high-density lipoprotein, and (3) the appearance of small dense low-density lipoproteins. This triad, along with endothelial dysfunction, which can also be induced by aberrant insulin signaling, contribute to atherosclerotic plaque formation.   Repetitive spaced learning was employed today to elicit superior memory formation and behavioral change.  3. Class 1 obesity with serious comorbidity and body mass index (BMI) of 30.0 to 30.9 in adult, unspecified obesity type  [name] still struggles with polyphagia. After discussion, patient would like to start below medication.   We have reviewed the risks and benefits of Saxenda. The patient denies a personal or family history of medullary thyroid cancer or MENII. The patient denies a history of pancreatitis.  Alternative treatment options have been discussed. Patient understands that all anti-obesity medications are contraindicated in pregnancy. The potential risks and benefits of Saxenda were reviewed with the patient, and alternative treatment options were discussed. All questions were answered, and the patient wishes to move forward with this medication.  Please visit [email or web address] for information about this medication. Please visit [email or web address] to review how to administer Saxenda.   Start with escalation dose: ? Week 1: 0.6 mg [place] once daily x 1 week ? Week 2: 1.2 mg [place] once daily x 1 week ? Week 3: 1.8 mg [name] once daily x 1 week ? Week 4: 2.4 mg [name] once daily x 1 week ? Week 5 onward: 3 mg [name] once daily  Orders - Liraglutide -Weight Management (SAXENDA) 18 MG/3ML SOPN; Inject 0.5 mLs (3 mg total) into the skin daily.  Dispense: 15 mL; Refill: 0 - Insulin Pen Needle  32G X 4 MM MISC; 1 each by Does not apply route daily.  Dispense: 100 each; Refill: 0  [name] is currently in the action stage of change. As such, her goal is to continue with weight loss efforts. She has  agreed to the Category 1 Plan.   Exercise goals: For substantial health benefits, adults should do at least 150 minutes (2 hours and 30 minutes) a week of moderate-intensity, or 75 minutes (1 hour and 15 minutes) a week of vigorous-intensity aerobic physical activity, or an equivalent combination of moderate- and vigorous-intensity aerobic activity. Aerobic activity should be performed in episodes of at least 10 minutes, and preferably, it should be spread throughout the week.  Behavioral modification strategies: increasing lean protein intake.  [name] has agreed to follow-up with our clinic in 3 weeks. She was informed of the importance of frequent follow-up visits to maximize her success with intensive lifestyle modifications for her multiple health conditions.   Objective:   Blood pressure 107/79, pulse 69, temperature 98.1 F (36.7 C), temperature source Oral, height 5\' 3"  (1.6 m), weight 173 lb (78.5 kg), SpO2 100 %. Body mass index is 30.65 kg/m.  General: Cooperative, alert, well developed, in no acute distress. HEENT: Conjunctivae and lids unremarkable. Cardiovascular: Regular rhythm.  Lungs: Normal work of breathing. Neurologic: No focal deficits.   Lab Results  Component Value Date   CREATININE 1.00 06/22/2019   BUN 15 06/22/2019   NA 140 06/22/2019   K 4.6 06/22/2019   CL 101 06/22/2019   CO2 33 (H) 06/22/2019   Lab Results  Component Value Date   ALT 10 06/22/2019   AST 15 06/22/2019   ALKPHOS 82 05/28/2019   BILITOT 0.7 06/22/2019   Lab Results  Component Value Date   HGBA1C 5.3 06/22/2019   HGBA1C 5.4 01/31/2019   HGBA1C 5.5 08/10/2018   HGBA1C 5.2 07/30/2016   Lab Results  Component Value Date   INSULIN 10.7 04/03/2019   Lab Results  Component Value Date   TSH 1.76 06/22/2019   Lab Results  Component Value Date   CHOL 184 06/22/2019   HDL 73 06/22/2019   LDLCALC 89 06/22/2019   TRIG 122 06/22/2019   CHOLHDL 2.5 06/22/2019   Lab Results    Component Value Date   WBC 5.4 06/22/2019   HGB 12.9 06/22/2019   HCT 39.5 06/22/2019   MCV 93.2 06/22/2019   PLT 293 06/22/2019   Lab Results  Component Value Date   IRON 108 05/28/2019   TIBC 363 05/28/2019   FERRITIN 36 05/28/2019   Attestation Statements:   Reviewed by clinician on day of visit: allergies, medications, problem list, medical history, surgical history, family history, social history, and previous encounter notes.  I, Water quality scientist, CMA, am acting as transcriptionist for Briscoe Deutscher, DO  I have reviewed the above documentation for accuracy and completeness, and I agree with the above. Briscoe Deutscher, DO

## 2019-09-07 ENCOUNTER — Other Ambulatory Visit: Payer: Self-pay | Admitting: Internal Medicine

## 2019-09-17 ENCOUNTER — Other Ambulatory Visit: Payer: Self-pay | Admitting: *Deleted

## 2019-09-17 MED ORDER — BUPROPION HCL ER (XL) 150 MG PO TB24: ORAL_TABLET | ORAL | 0 refills | Status: DC

## 2019-09-17 NOTE — Telephone Encounter (Signed)
Medication refill request: Bupropion  Last AEX:  09-20-2018 JJ  Next AEX: 09-26-19 Last MMG (if hormonal medication request): n/a Refill authorized: Today, please advise.   Medication pended for #30, 0RF. Please refill if appropriate.

## 2019-09-18 NOTE — Progress Notes (Signed)
54 y.o. G26P2002 Married White or Caucasian Not Hispanic or Latino female here for annual exam.  She needs a refill on her Wellbutrin. Depression is well controlled. No vaginal bleeding. No dyspareunia. Libido is better than last year.   Occasional GSI, no change.    She has been exercising and saw a nutritionist. She has lost 16 lbs in the last year.  No LMP recorded (lmp unknown). Patient is postmenopausal.          Sexually active: Yes.    The current method of family planning is post menopausal status.    Exercising: Yes.    Row and walking daily Smoker:  no  Health Maintenance: Pap:  01/30/16 WNL   History of abnormal Pap:  no MMG:  12/11/18 Density A Bi-rads 1 neg  BMD:   NA Colonoscopy: 12/28/15 polyp, follow up in 5 years per Dr Ardis Hughs at [facility]:  12/10/16  Gardasil: NA   reports that she has never smoked. She has never used smokeless tobacco. She reports current alcohol use. She reports that she does not use drugs. 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 She has 2 kids, one granddaughter (71 years old).  Past Medical History:  Diagnosis Date  . Anxiety   . Fluid retention   . GERD (gastroesophageal reflux disease)   . GERD (gastroesophageal reflux disease)   . Heart murmur    infant  . High cholesterol   . Hypertension   . Irregular heart beat   . Migraines   . OSA (obstructive sleep apnea) 10/04/2017  . PID (pelvic inflammatory disease)   . Recurrent upper respiratory infection (URI)    April 2019  . Sleep apnea   . Thyroid disease   . Urticaria     Past Surgical History:  Procedure Laterality Date  . AUGMENTATION MAMMAPLASTY Bilateral 2005  . BREAST SURGERY  2005   Augmentation  . CARPAL TUNNEL RELEASE Bilateral    R in '07 and L '06  . COSMETIC SURGERY    . leg lift    . TONSILLECTOMY  1972  . TUBAL LIGATION  1989  . tummy tuck  2005    Current Outpatient Medications  Medication Sig  Dispense Refill  . albuterol (PROVENTIL HFA;VENTOLIN HFA) 108 (90 Base) MCG/ACT inhaler Inhale 1-2 puffs into the lungs every 6 (six) hours as needed for wheezing or shortness of breath. 1 Inhaler prn  . ALPRAZolam (XANAX) 0.5 MG tablet TAKE 1 TABLET BY MOUTH AT BEDTIME AS NEEDED FOR ANXIETY 30 tablet 0  . B Complex Vitamins (B COMPLEX 100 PO) Take 1 capsule by mouth daily.    Marland Kitchen buPROPion (WELLBUTRIN XL) 150 MG 24 hr tablet TAKE 1 TABLET(150 MG) BY MOUTH DAILY 30 tablet 0  . Ergocalciferol (VITAMIN D2) 50 MCG (2000 UT) TABS Take 1 tablet by mouth daily.    Marland Kitchen glucosamine-chondroitin 500-400 MG tablet Take 1 tablet by mouth daily.    Marland Kitchen glycopyrrolate (ROBINUL) 2 MG tablet TAKE 1 TABLET BY MOUTH TWICE DAILY 60 tablet 1  . Korean Panax Ginseng 100 MG CAPS Take 1 capsule by mouth daily.    Marland Kitchen levothyroxine (SYNTHROID) 112 MCG tablet Take 1 tablet (112 mcg total) by mouth daily. 90 tablet 1  . Liraglutide -Weight Management (SAXENDA) 18 MG/3ML SOPN Inject 0.5 mLs (3 mg total) into the skin daily. 15 mL 0  . metFORMIN (GLUCOPHAGE) 500 MG tablet Take 1 tablet (500 mg total) by mouth  2 (two) times daily. 60 tablet 0  . metoprolol succinate (TOPROL-XL) 25 MG 24 hr tablet Take 0.5 tablets (12.5 mg total) by mouth daily. 30 tablet 0  . Omega-3 Fatty Acids (FISH OIL) 1000 MG CAPS Take 2 capsules by mouth daily.    . Probiotic Product (PROBIOTIC DAILY PO) Take by mouth.    . rosuvastatin (CRESTOR) 5 MG tablet TAKE 1 TABLET BY MOUTH 3 TIMES A WEEK 12 tablet 0  . SYMBICORT 160-4.5 MCG/ACT inhaler INHALE 2 PUFFS INTO THE LUNGS TWICE DAILY 10.2 g prn  . triamterene-hydrochlorothiazide (MAXZIDE-25) 37.5-25 MG tablet Take 1 tablet by mouth daily. (Patient taking differently: Take 0.5 tablets by mouth daily. ) 90 tablet 3  . TURMERIC PO Take 1 tablet daily by mouth.     No current facility-administered medications for this visit.    Family History  Problem Relation Age of Onset  . Diabetes Mother   . Uterine  cancer Mother   . Colon polyps Mother   . Allergic rhinitis Mother   . Hypertension Mother   . Thyroid disease Mother   . Obesity Mother   . Pancreatic cancer Father   . Hyperlipidemia Father   . Hypertension Father   . Crohn's disease Cousin   . Diabetes Maternal Grandmother   . Stroke Maternal Grandmother   . Diabetes Maternal Grandfather   . Stroke Paternal Grandmother   . Colon cancer Neg Hx   . Stomach cancer Neg Hx   . Esophageal cancer Neg Hx   . Rectal cancer Neg Hx   . Liver cancer Neg Hx   . Eczema Neg Hx   . Urticaria Neg Hx   . Asthma Neg Hx     Review of Systems  Allergic/Immunologic: Positive for environmental allergies.  All other systems reviewed and are negative.   Exam:   BP 100/62   Pulse 99   Ht 5' 1.5" (1.562 m)   Wt 173 lb (78.5 kg)   LMP  (LMP Unknown)   SpO2 98%   BMI 32.16 kg/m   Weight change: @WEIGHTCHANGE @ Height:   Height: 5' 1.5" (156.2 cm)  Ht Readings from Last 3 Encounters:  09/26/19 5' 1.5" (1.562 m)  09/04/19 5\' 3"  (1.6 m)  08/14/19 5\' 3"  (1.6 m)    General appearance: alert, cooperative and appears stated age Head: Normocephalic, without obvious abnormality, atraumatic Neck: no adenopathy, supple, symmetrical, trachea midline and thyroid normal to inspection and palpation Lungs: clear to auscultation bilaterally Cardiovascular: regular rate and rhythm Breasts: normal appearance, no masses or tenderness, bilateral implants Abdomen: soft, non-tender; non distended,  no masses,  no organomegaly Extremities: extremities normal, atraumatic, no cyanosis or edema Skin: Skin color, texture, turgor normal. No rashes or lesions Lymph nodes: Cervical, supraclavicular, and axillary nodes normal. No abnormal inguinal nodes palpated Neurologic: Grossly normal   Pelvic: External genitalia:  no lesions              Urethra:  normal appearing urethra with no masses, tenderness or lesions              Bartholins and Skenes: normal                  Vagina: normal appearing vagina with normal color and discharge, no lesions              Cervix: no lesions               Bimanual Exam:  Uterus:  normal size, contour,  position, consistency, mobility, non-tender              Adnexa: no mass, fullness, tenderness               Rectovaginal: Confirms               Anus:  normal sphincter tone, no lesions  Marisa Sprinkles chaperoned for the exam.  A:  Well Woman with normal exam  P:   Pap with hpv  Mammogram 11/21  Colonoscopy in 12/22  Discussed breast self exam  Discussed calcium and vit D intake  Labs with primary

## 2019-09-21 ENCOUNTER — Other Ambulatory Visit: Payer: Self-pay | Admitting: Internal Medicine

## 2019-09-26 ENCOUNTER — Ambulatory Visit: Payer: 59 | Admitting: Obstetrics and Gynecology

## 2019-09-26 ENCOUNTER — Encounter: Payer: Self-pay | Admitting: Obstetrics and Gynecology

## 2019-09-26 ENCOUNTER — Other Ambulatory Visit: Payer: Self-pay

## 2019-09-26 ENCOUNTER — Other Ambulatory Visit (INDEPENDENT_AMBULATORY_CARE_PROVIDER_SITE_OTHER): Payer: Self-pay | Admitting: Family Medicine

## 2019-09-26 ENCOUNTER — Other Ambulatory Visit: Payer: Self-pay | Admitting: Internal Medicine

## 2019-09-26 ENCOUNTER — Other Ambulatory Visit (HOSPITAL_COMMUNITY)
Admission: RE | Admit: 2019-09-26 | Discharge: 2019-09-26 | Disposition: A | Discharge status: Home / Self Care | Payer: 59 | Source: Ambulatory Visit | Attending: Obstetrics and Gynecology | Admitting: Obstetrics and Gynecology

## 2019-09-26 VITALS — BP 100/62 | HR 99 | Ht 61.5 in | Wt 173.0 lb

## 2019-09-26 DIAGNOSIS — Z01419 Encounter for gynecological examination (general) (routine) without abnormal findings: Secondary | ICD-10-CM | POA: Diagnosis not present

## 2019-09-26 DIAGNOSIS — Z124 Encounter for screening for malignant neoplasm of cervix: Secondary | ICD-10-CM

## 2019-09-26 DIAGNOSIS — E8881 Metabolic syndrome: Secondary | ICD-10-CM

## 2019-09-26 MED ORDER — BUPROPION HCL ER (XL) 150 MG PO TB24: ORAL_TABLET | ORAL | 3 refills | Status: DC

## 2019-09-26 NOTE — Patient Instructions (Signed)

## 2019-09-27 ENCOUNTER — Encounter (INDEPENDENT_AMBULATORY_CARE_PROVIDER_SITE_OTHER): Payer: Self-pay | Admitting: Family Medicine

## 2019-09-27 ENCOUNTER — Ambulatory Visit (INDEPENDENT_AMBULATORY_CARE_PROVIDER_SITE_OTHER): Payer: 59 | Admitting: Family Medicine

## 2019-09-27 VITALS — BP 101/71 | HR 87 | Temp 97.9°F | Ht 63.0 in | Wt 169.0 lb

## 2019-09-27 DIAGNOSIS — E8881 Metabolic syndrome: Secondary | ICD-10-CM

## 2019-09-27 DIAGNOSIS — Z9189 Other specified personal risk factors, not elsewhere classified: Secondary | ICD-10-CM

## 2019-09-27 DIAGNOSIS — E88819 Insulin resistance, unspecified: Secondary | ICD-10-CM

## 2019-09-27 DIAGNOSIS — E663 Overweight: Secondary | ICD-10-CM | POA: Diagnosis not present

## 2019-09-27 DIAGNOSIS — E039 Hypothyroidism, unspecified: Secondary | ICD-10-CM

## 2019-09-27 DIAGNOSIS — Z6829 Body mass index (BMI) 29.0-29.9, adult: Secondary | ICD-10-CM

## 2019-09-27 LAB — CYTOLOGY - PAP
Comment: NEGATIVE
Diagnosis: NEGATIVE
High risk HPV: NEGATIVE

## 2019-09-28 ENCOUNTER — Other Ambulatory Visit (INDEPENDENT_AMBULATORY_CARE_PROVIDER_SITE_OTHER): Payer: Self-pay | Admitting: Family Medicine

## 2019-09-28 DIAGNOSIS — E8881 Metabolic syndrome: Secondary | ICD-10-CM

## 2019-09-29 MED ORDER — METFORMIN HCL 500 MG PO TABS: 500.0000 mg | ORAL_TABLET | Freq: Two times a day (BID) | ORAL | 0 refills | Status: DC

## 2019-09-29 NOTE — Progress Notes (Signed)
Chief Complaint:   OBESITY Rebecca Mccoy is here to discuss Rebecca progress with Rebecca obesity treatment plan along with follow-up of Rebecca obesity related diagnoses. Rebecca Mccoy is on the Category 1 Plan and states she is following Rebecca eating plan approximately 95% of the time. Rebecca Mccoy states she is walking/rowing 30 minutes 2-3 times per week.  Total lbs lost since last in-office visit: 4  Interim History: Rebecca Mccoy and Rebecca Mccoy are both doing well with the program. She is focused on healthy food choices and exercising regularly. AOM medications: Saxenda, Metformin, Wellbutrin.   Assessment/Plan:   1. Insulin resistance At goal. Tolerating medication. Requires refill today. Reviewed with Rebecca: exercise, nutrition, and carbohydrate counting. Rebecca Mccoy will continue to work on weight loss, exercise, and decreasing simple carbohydrates.  Lab Results  Component Value Date   HGBA1C 5.3 06/22/2019   HGBA1C 5.4 01/31/2019   HGBA1C 5.5 08/10/2018   Lab Results  Component Value Date   LDLCALC 89 06/22/2019   CREATININE 1.00 06/22/2019   - metFORMIN (GLUCOPHAGE) 500 MG tablet; Take 1 tablet (500 mg total) by mouth 2 (two) times daily.  Dispense: 180 tablet; Refill: 0  2. Acquired hypothyroidism Current symptoms: none.   Lab Results  Component Value Date   TSH 1.76 06/22/2019   Patient with long-standing hypothyroidism, on levothyroxine therapy. She appears euthyroid. Orders and follow up as documented in patient record.  Counseling . Good thyroid control is important for overall health. Supratherapeutic thyroid levels are dangerous and will not improve weight loss results. . The correct way to take levothyroxine is fasting, with water, separated by at least 30 minutes from breakfast, and separated by more than 4 hours from calcium, iron, multivitamins, acid reflux medications (PPIs).   3. At risk for complication associated with hypotension Rebecca Mccoy was given approximately 15 minutes of education and  counseling today to help avoid hypotension. We discussed risks of hypotension with weight loss and signs of hypotension such as feeling lightheaded or unsteady.  Repetitive spaced learning was employed today to elicit superior memory formation and behavioral change.  4. Overweight with body mass index (BMI) of 29 to 29.9 in adult  The current medical regimen is effective;  continue present plan and medications.  Rebecca Mccoy is currently in the action stage of change. As such, Rebecca goal is to continue with weight loss efforts. She has agreed to the Category 1 Plan.   Exercise goals: For substantial health benefits, adults should do at least 150 minutes (2 hours and 30 minutes) a week of moderate-intensity, or 75 minutes (1 hour and 15 minutes) a week of vigorous-intensity aerobic physical activity, or an equivalent combination of moderate- and vigorous-intensity aerobic activity. Aerobic activity should be performed in episodes of at least 10 minutes, and preferably, it should be spread throughout the week. For additional and more extensive health benefits, adults should increase their aerobic physical activity to 300 minutes (5 hours) a week of moderate-intensity, or 150 minutes a week of vigorous-intensity aerobic physical activity, or an equivalent combination of moderate- and vigorous-intensity activity. Additional health benefits are gained by engaging in physical activity beyond this amount.  Adults should also include muscle-strengthening activities that involve all major muscle groups on 2 or more days a week.  Behavioral modification strategies: increasing lean protein intake, decreasing simple carbohydrates, increasing vegetables and increasing water intake.  Rebecca Mccoy has agreed to follow-up with our clinic in 3 weeks. She was informed of the importance of frequent follow-up visits to maximize Rebecca success  with intensive lifestyle modifications for Rebecca multiple health conditions.   Objective:   Blood  pressure 101/71, pulse 87, temperature 97.9 F (36.6 C), temperature source Oral, height 5\' 3"  (1.6 m), weight 169 lb (76.7 kg), SpO2 100 %. Body mass index is 29.94 kg/m.  General: Cooperative, alert, well developed, in no acute distress. HEENT: Conjunctivae and lids unremarkable. Cardiovascular: Regular rhythm.  Lungs: Normal work of breathing. Neurologic: No focal deficits.   Lab Results  Component Value Date   CREATININE 1.00 06/22/2019   BUN 15 06/22/2019   NA 140 06/22/2019   K 4.6 06/22/2019   CL 101 06/22/2019   CO2 33 (H) 06/22/2019   Lab Results  Component Value Date   ALT 10 06/22/2019   AST 15 06/22/2019   ALKPHOS 82 05/28/2019   BILITOT 0.7 06/22/2019   Lab Results  Component Value Date   HGBA1C 5.3 06/22/2019   HGBA1C 5.4 01/31/2019   HGBA1C 5.5 08/10/2018   HGBA1C 5.2 07/30/2016   Lab Results  Component Value Date   INSULIN 10.7 04/03/2019   Lab Results  Component Value Date   TSH 1.76 06/22/2019   Lab Results  Component Value Date   CHOL 184 06/22/2019   HDL 73 06/22/2019   LDLCALC 89 06/22/2019   TRIG 122 06/22/2019   CHOLHDL 2.5 06/22/2019   Lab Results  Component Value Date   WBC 5.4 06/22/2019   HGB 12.9 06/22/2019   HCT 39.5 06/22/2019   MCV 93.2 06/22/2019   PLT 293 06/22/2019   Lab Results  Component Value Date   IRON 108 05/28/2019   TIBC 363 05/28/2019   FERRITIN 36 05/28/2019   Attestation Statements:   Reviewed by clinician on day of visit: allergies, medications, problem list, medical history, surgical history, family history, social history, and previous encounter notes.

## 2019-10-04 ENCOUNTER — Telehealth: Payer: Self-pay

## 2019-10-04 NOTE — Telephone Encounter (Signed)
Patient called she did a COVID on Tuesday 9/14 at [facility] bc she was going out of the country but today she got the results and she is positive she wanted to know what she needs to be doing temperature is  97.9. She is has some symptoms as if she was having "allergies symptoms", her husband has no taste or smell but his results have not came back. I told her to keep a record of her temperature and to get get an O2 stat and keep a record.

## 2019-10-04 NOTE — Telephone Encounter (Signed)
She will not be traveling out of the country as planned. Set up virtual visit Friday.

## 2019-10-05 ENCOUNTER — Encounter: Payer: Self-pay | Admitting: Internal Medicine

## 2019-10-05 ENCOUNTER — Telehealth (INDEPENDENT_AMBULATORY_CARE_PROVIDER_SITE_OTHER): Payer: 59 | Admitting: Internal Medicine

## 2019-10-05 DIAGNOSIS — U071 COVID-19: Secondary | ICD-10-CM | POA: Diagnosis not present

## 2019-10-05 MED ORDER — DOXYCYCLINE HYCLATE 100 MG PO TABS: 100.0000 mg | ORAL_TABLET | Freq: Two times a day (BID) | ORAL | 0 refills | Status: DC

## 2019-10-05 MED ORDER — FLUCONAZOLE 150 MG PO TABS: 150.0000 mg | ORAL_TABLET | Freq: Once | ORAL | 0 refills | Status: AC

## 2019-10-05 NOTE — Progress Notes (Signed)
   Subjective:    Patient ID: Rebecca Mccoy, female    DOB: September 26, 1965, 55 y.o.   MRN: [id number]  HPI 54 year old Female patient in this practice seen by interactive audio and video communications today due to the coronavirus pandemic.  She is agreeable to visit in this format today.  She is identified using 2 identifiers as Rebecca Mccoy. Rebecca Mccoy,, patient in this practice.  Patient is at home and I am in my office.  Patient and her husband were planning a trip to [place].  Had negative COVID-19 test before traveling.  On Tuesday they went for testing.  Husband has not been vaccinated.  Patient has been vaccinated for COVID-19.  Patient has had congestion and cough.  They went to a testing site where they did self collected nasal swabs.  They were informed that her test was positive and his test was negative.  He has more symptoms than she does.  Patient has headache, head congestion and cough.  No shortness of breath.  Knows to monitor her pulse oximetry at home.  Has not had documented fever.  Husband has dysgeusia but patient does not.  Patient denies fever, chills, or myalgias.    Review of Systems see above     Objective:   Physical Exam Seen virtually in no acute distress.  Does not appear to be tachypneic or in respiratory distress.       Assessment & Plan:  COVID-19 positive with mild symptoms  Plan: Doxycycline 100 mg twice daily for 10 days.  Diflucan 150 mg tablet to take if develops Candida vaginitis while on antibiotics.  Rest and drink plenty of fluids.  Monitor temperature and pulse oximetry.  Call if symptoms worsen.  They have deferred the trip to [place] until later in the Fall.  Time spent with virtual visit, reviewing records and medical decision making is 15 minutes

## 2019-10-05 NOTE — Patient Instructions (Signed)
Rest and drink plenty of fluids.  Doxycycline 100 mg twice daily for 10 days.  Diflucan 150 mg one-time dose if develop Candida vaginitis while on antibiotics.  Monitor pulse oximetry at home.  Quarantine at home for 10 days.  Call if symptoms worsen.

## 2019-10-05 NOTE — Telephone Encounter (Signed)
Scheduled

## 2019-10-19 ENCOUNTER — Other Ambulatory Visit: Payer: Self-pay | Admitting: Internal Medicine

## 2019-10-19 ENCOUNTER — Other Ambulatory Visit: Payer: Self-pay

## 2019-10-19 ENCOUNTER — Encounter: Payer: Self-pay | Admitting: Internal Medicine

## 2019-10-19 ENCOUNTER — Telehealth (INDEPENDENT_AMBULATORY_CARE_PROVIDER_SITE_OTHER): Payer: 59 | Admitting: Internal Medicine

## 2019-10-19 VITALS — Temp 97.7°F

## 2019-10-19 DIAGNOSIS — U071 COVID-19: Secondary | ICD-10-CM

## 2019-10-19 DIAGNOSIS — R053 Chronic cough: Secondary | ICD-10-CM

## 2019-10-19 MED ORDER — ALPRAZOLAM 0.5 MG PO TABS
ORAL_TABLET | ORAL | 2 refills | Status: DC
Start: 2019-10-19 — End: 2020-01-28

## 2019-10-19 MED ORDER — PREDNISONE 10 MG PO TABS: ORAL_TABLET | ORAL | 0 refills | Status: DC

## 2019-10-19 MED ORDER — FLUCONAZOLE 150 MG PO TABS: 150.0000 mg | ORAL_TABLET | Freq: Once | ORAL | 0 refills | Status: AC

## 2019-10-19 MED ORDER — LEVOFLOXACIN 500 MG PO TABS: 500.0000 mg | ORAL_TABLET | Freq: Every day | ORAL | 0 refills | Status: DC

## 2019-10-19 NOTE — Telephone Encounter (Signed)
Scheduled

## 2019-10-19 NOTE — Telephone Encounter (Signed)
Virtual visit 

## 2019-10-19 NOTE — Telephone Encounter (Signed)
Rebecca Mccoy has called back to say she had gotten some better, but on Monday night started  Back with a cough and getting a stuffy nose again and it is gradually getting worse. She is taking Sudafed and Musinex, does not seem to be helping. First thing in the morning she also has a headache over her eye area. No fever and oxygen level is good.

## 2019-10-19 NOTE — Progress Notes (Signed)
   Subjective:    Patient ID: MARIPOSA SHORES, female    DOB: [birth year], 54 y.o.   MRN: [id number]  HPI 54 year old Female seen via interactive audio and video telecommunications today due to the COVID-19 pandemic.  She is identified using 2 identifiers as Rebecca Mccoy. Guhl, a patient in this practice.  She is agreeable to visit in this format today.  She is at home and I am in my office.  Patient was diagnosed with COVID-19 recently and has had a mild case but has protracted cough.  Was [facility].  Her husband has symptoms as well.  They were planning a trip to [place] and that has been postponed until early October.  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  She received a J&J vaccine on April 21.  She was seen by me via video visit September 17.  Was treated with doxycycline at that time for mild symptoms of Covid with cough.  Continues with cough and is concerned because she needs to be well to go to [place].  Cough is slightly productive.  No fever, chills, nausea, vomiting.  No headache.  No myalgias.    Review of Systems see above    Objective:   Physical Exam  Seen virtually today in no acute distress.  Not wheezing.         Assessment & Plan:  Protracted cough  COVID-19 virus infection  Plan: Levaquin 500 mg daily for 10 days.  Take prednisone in tapering course starting with 60 mg and decreasing by 10 mg daily over 6 days.  May take Diflucan 150 mg tablet once for Candida vaginitis with prednisone and antibiotic therapy.  Rest and drink plenty of fluids.  Patient says she will need letter clearing her to travel to [place] prior to their departure.  Office visit advised for this.  Not scheduled to depart until October 22.

## 2019-10-19 NOTE — Addendum Note (Signed)
Addended by: Mady Haagensen on: 10/19/2019 01:01 PM   Modules accepted: Orders

## 2019-10-24 ENCOUNTER — Ambulatory Visit (INDEPENDENT_AMBULATORY_CARE_PROVIDER_SITE_OTHER): Payer: 59 | Admitting: Family Medicine

## 2019-10-24 ENCOUNTER — Encounter (INDEPENDENT_AMBULATORY_CARE_PROVIDER_SITE_OTHER): Payer: Self-pay | Admitting: Family Medicine

## 2019-10-24 ENCOUNTER — Other Ambulatory Visit: Payer: Self-pay

## 2019-10-24 VITALS — BP 108/73 | HR 79 | Temp 98.0°F | Ht 63.0 in | Wt 169.0 lb

## 2019-10-24 DIAGNOSIS — Z8616 Personal history of COVID-19: Secondary | ICD-10-CM

## 2019-10-24 DIAGNOSIS — E8881 Metabolic syndrome: Secondary | ICD-10-CM

## 2019-10-24 DIAGNOSIS — E669 Obesity, unspecified: Secondary | ICD-10-CM | POA: Diagnosis not present

## 2019-10-24 DIAGNOSIS — Z6830 Body mass index (BMI) 30.0-30.9, adult: Secondary | ICD-10-CM

## 2019-10-25 NOTE — Progress Notes (Signed)
Chief Complaint:   OBESITY Rebecca Mccoy is here to discuss her progress with her obesity treatment plan along with follow-up of her obesity related diagnoses. Rebecca Mccoy is on the Category 1 Plan and states she is following her eating plan approximately 90% of the time. Rebecca Mccoy states she is exercising for 0 minutes 0 times per week.  Today's visit was #: 9 Starting weight: 183 lbs Starting date: 04/03/2019 Today's weight: 169 lbs Today's date: 10/24/2019 Total lbs lost to date: 14 lbs Total lbs lost since last in-office visit: 4 lbs Total weight loss percentage to date: -7.65%  Interim History: Rebecca Mccoy says she recently had COVID.  She is feeling better, but is not exercising yet.  She will be going to [place] at the end of October.  She says she is adhering to the diet.  Assessment/Plan:   1. History of COVID-19 With subsequent sinus infection.  Improving.  Finished prednisone.  Finishing antibiotic. Will continue to monitor symptoms as they relate to her weight loss journey.  2. Metabolic syndrome Starting goal: Lose 7-10% of starting weight. She will continue to focus on protein-rich, low simple carbohydrate foods. We reviewed the importance of hydration, regular exercise for stress reduction, and restorative sleep.   3. Class 1 obesity with serious comorbidity and body mass index (BMI) of 30.0 to 30.9 in adult, unspecified obesity type  Rebecca Mccoy is currently in the action stage of change. As such, her goal is to continue with weight loss efforts. She has agreed to the Category 1 Plan.   Exercise goals: As tolerated.  Behavioral modification strategies: increasing lean protein intake, decreasing simple carbohydrates and increasing vegetables.  Rebecca Mccoy has agreed to follow-up with our clinic in 3-4 weeks. She was informed of the importance of frequent follow-up visits to maximize her success with intensive lifestyle modifications for her multiple health conditions.   Objective:   Blood pressure  108/73, pulse 79, temperature 98 F (36.7 C), temperature source Oral, height 5\' 3"  (1.6 m), weight 169 lb (76.7 kg), SpO2 99 %. Body mass index is 29.94 kg/m.  General: Cooperative, alert, well developed, in no acute distress. HEENT: Conjunctivae and lids unremarkable. Cardiovascular: Regular rhythm.  Lungs: Normal work of breathing. Neurologic: No focal deficits.   Lab Results  Component Value Date   CREATININE 1.00 06/22/2019   BUN 15 06/22/2019   NA 140 06/22/2019   K 4.6 06/22/2019   CL 101 06/22/2019   CO2 33 (H) 06/22/2019   Lab Results  Component Value Date   ALT 10 06/22/2019   AST 15 06/22/2019   ALKPHOS 82 05/28/2019   BILITOT 0.7 06/22/2019   Lab Results  Component Value Date   HGBA1C 5.3 06/22/2019   HGBA1C 5.4 01/31/2019   HGBA1C 5.5 08/10/2018   HGBA1C 5.2 07/30/2016   Lab Results  Component Value Date   INSULIN 10.7 04/03/2019   Lab Results  Component Value Date   TSH 1.76 06/22/2019   Lab Results  Component Value Date   CHOL 184 06/22/2019   HDL 73 06/22/2019   LDLCALC 89 06/22/2019   TRIG 122 06/22/2019   CHOLHDL 2.5 06/22/2019   Lab Results  Component Value Date   WBC 5.4 06/22/2019   HGB 12.9 06/22/2019   HCT 39.5 06/22/2019   MCV 93.2 06/22/2019   PLT 293 06/22/2019   Lab Results  Component Value Date   IRON 108 05/28/2019   TIBC 363 05/28/2019   FERRITIN 36 05/28/2019   Attestation Statements:  Reviewed by clinician on day of visit: allergies, medications, problem list, medical history, surgical history, family history, social history, and previous encounter notes.  Time spent on visit including pre-visit chart review and post-visit care and charting was 30 minutes.   I, Water quality scientist, CMA, am acting as transcriptionist for Briscoe Deutscher, DO  I have reviewed the above documentation for accuracy and completeness, and I agree with the above. Briscoe Deutscher, DO

## 2019-11-02 ENCOUNTER — Telehealth: Payer: Self-pay | Admitting: Internal Medicine

## 2019-11-02 NOTE — Telephone Encounter (Signed)
This requires OV

## 2019-11-02 NOTE — Telephone Encounter (Signed)
scheduled

## 2019-11-02 NOTE — Telephone Encounter (Signed)
[name] [phone]  Rebecca Mccoy called to say, now that she is feeling much better, they are ready to make their trip that they missed due to testing positive for COVID-19. However she needs a letter from her doctor stating she has recovered from COVID-19 so that when they go to [place] she can get back into the country. They are leaving next Friday.

## 2019-11-05 ENCOUNTER — Encounter: Payer: Self-pay | Admitting: Internal Medicine

## 2019-11-05 ENCOUNTER — Other Ambulatory Visit: Payer: Self-pay

## 2019-11-05 ENCOUNTER — Ambulatory Visit: Payer: 59 | Admitting: Internal Medicine

## 2019-11-05 VITALS — BP 120/80 | HR 82 | Ht 62.0 in | Wt 176.0 lb

## 2019-11-05 DIAGNOSIS — Z7184 Encounter for health counseling related to travel: Secondary | ICD-10-CM | POA: Diagnosis not present

## 2019-11-05 DIAGNOSIS — Z8616 Personal history of COVID-19: Secondary | ICD-10-CM

## 2019-11-05 NOTE — Progress Notes (Signed)
   Subjective:    Patient ID: Rebecca Mccoy, female    DOB: May 03, 1965, 54 y.o.   MRN: [id number]  HPI  54 year old Female seen today for clearance to travel to [place].  Patient was diagnosed with COVID-19 October 02, 2019 in the local pharmacy.  She had mild symptoms of cough, headache, and head congestion treated with doxycycline.  She quarantined at home.  She had protracted cough that was treated with Levaquin and a tapering course of prednisone on October 1.  Today she is seen for clearance to travel to [place].  She feels well and has no symptoms of COVID-19 at this time.  In my opinion, she has completely recovered from COVID-19 infection and has no sequela.  She requires a letter of recovery in order to be able to travel.  She and her husband plan to leave this coming Friday.  A letter was prepared as she requested outlining this history.  Patient received Toma Aran vaccine on April 20, 2019.    Review of Systems no new complaints.  No cough fever chills or dysgeusia.  No sore throat.  No headache.     Objective:   Physical Exam Blood pressure 120/80 pulse 82 temperature 99 degrees orally weight 176 pounds height 5 feet 2 inches.  Skin warm and dry.  Nodes none.  Neck is supple.  Chest is clear to auscultation without rales or wheezing.       Assessment & Plan:  History of COVID-19 infection-recovered  Plan: Letter prepared for travel to [place] as requested.  Time spent reviewing records, seeing patient, and preparing a letter is 20 minutes

## 2019-11-05 NOTE — Patient Instructions (Signed)
Have seen and examined patient.  Letter for travel to [place] has been prepared today.

## 2019-11-10 ENCOUNTER — Encounter: Payer: Self-pay | Admitting: Internal Medicine

## 2019-11-10 NOTE — Patient Instructions (Addendum)
Patient seen virtually today.  Is to take prednisone in tapering course as directed starting with 60 mg day 1 and decreasing by 10 mg daily over 6 days.  Levaquin 500 mg daily for 10 days.  May take Diflucan 150 mg tablet once if develops Candida vaginitis symptoms on this therapy.  Will need follow-up in person visit with letter of recovery to travel to [place] mid October.

## 2019-11-21 ENCOUNTER — Other Ambulatory Visit: Payer: Self-pay

## 2019-11-21 ENCOUNTER — Encounter (INDEPENDENT_AMBULATORY_CARE_PROVIDER_SITE_OTHER): Payer: Self-pay | Admitting: Family Medicine

## 2019-11-21 ENCOUNTER — Ambulatory Visit (INDEPENDENT_AMBULATORY_CARE_PROVIDER_SITE_OTHER): Payer: 59 | Admitting: Family Medicine

## 2019-11-21 VITALS — BP 104/71 | HR 87 | Temp 98.1°F | Ht 62.0 in | Wt 172.0 lb

## 2019-11-21 DIAGNOSIS — E8881 Metabolic syndrome: Secondary | ICD-10-CM

## 2019-11-21 DIAGNOSIS — E039 Hypothyroidism, unspecified: Secondary | ICD-10-CM | POA: Diagnosis not present

## 2019-11-21 DIAGNOSIS — Z6830 Body mass index (BMI) 30.0-30.9, adult: Secondary | ICD-10-CM | POA: Diagnosis not present

## 2019-11-21 DIAGNOSIS — E669 Obesity, unspecified: Secondary | ICD-10-CM

## 2019-11-21 NOTE — Progress Notes (Signed)
Chief Complaint:   OBESITY Rebecca Mccoy is here to discuss her progress with her obesity treatment plan along with follow-up of her obesity related diagnoses.   Today's visit was #: 10 Starting weight: 183 lbs Starting date: 04/03/2019 Today's weight: 172 lbs Today's date: 11/21/2019 Total lbs lost to date: 11 lbs Body mass index is 31.46 kg/m.  Total weight loss percentage to date: -6.01%  Interim History: Rebecca Mccoy and her husband recently got back from [place].  They had a wonderful time and admit to having alcoholic beverages daily.  Generally, they shared meals and did lots of walking.  She is completely recovered from Covid.  She is okay with her minimal weight gain and ready to get back on track.  Nutrition Plan: the Category 1 Plan for 90% of the time.  Anti-obesity medications: Metformin, Saxenda, Wellbutrin. Reported side effects: None. Hunger is well controlled controlled. Cravings are well controlled controlled.  Activity: Walking 2 miles 3-4 times per week Sleep: Sleep is restful.   Assessment/Plan:   1. Insulin resistance Rebecca Mccoy has a diagnosis of insulin resistance based on her elevated fasting insulin level >5. She is taking metformin 500 mg twice daily. She will continue to focus on protein-rich, low simple carbohydrate foods. We reviewed the importance of hydration, regular exercise for stress reduction, and restorative sleep.   Lab Results  Component Value Date   INSULIN 10.7 04/03/2019   Lab Results  Component Value Date   HGBA1C 5.3 06/22/2019   2. Metabolic syndrome Improving. Starting goal: Lose 7-10% of starting weight. She will continue to focus on protein-rich, low simple carbohydrate foods. We reviewed the importance of hydration, regular exercise for stress reduction, and restorative sleep.  We will continue to check lab work every 3 months, with 10% weight loss, or should any other concerns arise.  3. Acquired hypothyroidism Medication: levothyroxine 112  mcg daily. She appears euthyroid.   Lab Results  Component Value Date   TSH 1.76 06/22/2019   Plan: Patient was instructed not to take MVM or iron within 4 hours of taking thyroid medications. We will continue to monitor symptoms as they relate to her weight loss journey.  4. Class 1 obesity with serious comorbidity and body mass index (BMI) of 30.0 to 30.9 in adult, unspecified obesity type  Course: Rebecca Mccoy is currently in the action stage of change. As such, her goal is to continue with weight loss efforts.   Nutrition goals: She has agreed to the Category 1 Plan.   Exercise goals: For substantial health benefits, adults should do at least 150 minutes (2 hours and 30 minutes) a week of moderate-intensity, or 75 minutes (1 hour and 15 minutes) a week of vigorous-intensity aerobic physical activity, or an equivalent combination of moderate- and vigorous-intensity aerobic activity. Aerobic activity should be performed in episodes of at least 10 minutes, and preferably, it should be spread throughout the week.  Behavioral modification strategies: increasing lean protein intake, decreasing simple carbohydrates, increasing vegetables, increasing water intake and decreasing liquid calories.  Rebecca Mccoy has agreed to follow-up with our clinic in 3 weeks. She was informed of the importance of frequent follow-up visits to maximize her success with intensive lifestyle modifications for her multiple health conditions.   Objective:   Blood pressure 104/71, pulse 87, temperature 98.1 F (36.7 C), temperature source Oral, height 5\' 2"  (1.575 m), weight 172 lb (78 kg), SpO2 96 %. Body mass index is 31.46 kg/m.  General: Cooperative, alert, well developed, in no acute  distress. HEENT: Conjunctivae and lids unremarkable. Cardiovascular: Regular rhythm.  Lungs: Normal work of breathing. Neurologic: No focal deficits.   Lab Results  Component Value Date   CREATININE 1.00 06/22/2019   BUN 15 06/22/2019    NA 140 06/22/2019   K 4.6 06/22/2019   CL 101 06/22/2019   CO2 33 (H) 06/22/2019   Lab Results  Component Value Date   ALT 10 06/22/2019   AST 15 06/22/2019   ALKPHOS 82 05/28/2019   BILITOT 0.7 06/22/2019   Lab Results  Component Value Date   HGBA1C 5.3 06/22/2019   HGBA1C 5.4 01/31/2019   HGBA1C 5.5 08/10/2018   HGBA1C 5.2 07/30/2016   Lab Results  Component Value Date   INSULIN 10.7 04/03/2019   Lab Results  Component Value Date   TSH 1.76 06/22/2019   Lab Results  Component Value Date   CHOL 184 06/22/2019   HDL 73 06/22/2019   LDLCALC 89 06/22/2019   TRIG 122 06/22/2019   CHOLHDL 2.5 06/22/2019   Lab Results  Component Value Date   WBC 5.4 06/22/2019   HGB 12.9 06/22/2019   HCT 39.5 06/22/2019   MCV 93.2 06/22/2019   PLT 293 06/22/2019   Lab Results  Component Value Date   IRON 108 05/28/2019   TIBC 363 05/28/2019   FERRITIN 36 05/28/2019   Attestation Statements:   Reviewed by clinician on day of visit: allergies, medications, problem list, medical history, surgical history, family history, social history, and previous encounter notes.  Time spent on visit including pre-visit chart review and post-visit care and charting was 25 minutes.   I, Water quality scientist, CMA, am acting as transcriptionist for Briscoe Deutscher, DO  I have reviewed the above documentation for accuracy and completeness, and I agree with the above. Briscoe Deutscher, DO

## 2019-11-25 ENCOUNTER — Other Ambulatory Visit: Payer: Self-pay | Admitting: Internal Medicine

## 2019-11-26 NOTE — Telephone Encounter (Signed)
Due for 6 month follow up December. Please book before refilling. Needs labs then as well.

## 2019-11-26 NOTE — Telephone Encounter (Signed)
Lipid, liver ,AIC, TSH

## 2019-12-19 ENCOUNTER — Ambulatory Visit (INDEPENDENT_AMBULATORY_CARE_PROVIDER_SITE_OTHER): Payer: 59 | Admitting: Family Medicine

## 2019-12-20 ENCOUNTER — Telehealth: Payer: Self-pay | Admitting: Internal Medicine

## 2019-12-20 NOTE — Telephone Encounter (Signed)
Scheduled

## 2019-12-20 NOTE — Telephone Encounter (Signed)
OK 

## 2019-12-20 NOTE — Telephone Encounter (Signed)
Pt wants to know if she can be seen tomorrow, she believes she has a kidney infection. She has higher urine frequency. It started Tuesday

## 2019-12-21 ENCOUNTER — Encounter: Payer: Self-pay | Admitting: Internal Medicine

## 2019-12-21 ENCOUNTER — Ambulatory Visit: Payer: 59 | Admitting: Internal Medicine

## 2019-12-21 ENCOUNTER — Other Ambulatory Visit: Payer: Self-pay

## 2019-12-21 VITALS — BP 100/70 | HR 87 | Temp 98.3°F | Ht 62.0 in | Wt 176.0 lb

## 2019-12-21 DIAGNOSIS — R8290 Unspecified abnormal findings in urine: Secondary | ICD-10-CM

## 2019-12-21 DIAGNOSIS — N390 Urinary tract infection, site not specified: Secondary | ICD-10-CM

## 2019-12-21 DIAGNOSIS — R1030 Lower abdominal pain, unspecified: Secondary | ICD-10-CM | POA: Diagnosis not present

## 2019-12-21 DIAGNOSIS — R319 Hematuria, unspecified: Secondary | ICD-10-CM

## 2019-12-21 LAB — POCT URINALYSIS DIPSTICK
Bilirubin, UA: NEGATIVE
Glucose, UA: NEGATIVE
Ketones, UA: NEGATIVE
Nitrite, UA: NEGATIVE
Protein, UA: NEGATIVE
Spec Grav, UA: 1.01 (ref 1.010–1.025)
Urobilinogen, UA: 0.2 E.U./dL
pH, UA: 7 (ref 5.0–8.0)

## 2019-12-21 MED ORDER — FLUCONAZOLE 150 MG PO TABS: 150.0000 mg | ORAL_TABLET | Freq: Every day | ORAL | 1 refills | Status: DC

## 2019-12-21 MED ORDER — CIPROFLOXACIN HCL 500 MG PO TABS: 500.0000 mg | ORAL_TABLET | Freq: Two times a day (BID) | ORAL | 0 refills | Status: DC

## 2019-12-21 NOTE — Progress Notes (Signed)
   Subjective:    Patient ID: Rebecca Mccoy, female    DOB: 28-Jun-1965, 54 y.o.   MRN: [id number]  HPI 54 year old Female has had urinary frequency for 3 days without fever or hematuria.  Last night had suppubic pain all night long. Trace of blood on toilet paper.  Being seen at [facility] and diagnosed with insulin resistance.  Had COVID-19 infection in September.  Dipstick UA shows trace occult blood and trace leukocytes.  Review of Systems no nausea vomiting fever or shaking chills     Objective:   Physical Exam  Blood pressure 100/70 pulse 87 regular temperature 98.3 degrees pulse oximetry 98% weight 176 pounds BMI 32.19.    Skin warm and dry.  No CVA tenderness.  See urine dipstick results.  Culture was sent  Addendum: Results of culture revealed E. coli sensitive to Cipro with which she was treated with 500 mg twice daily for 7 days.      Assessment & Plan:  Acute urinary tract infection  Plan: Cipro 500 mg twice daily for 7 days.  Recheck urine specimen when she returns for her regular visit on December 9.

## 2019-12-23 LAB — URINALYSIS, MICROSCOPIC ONLY: Hyaline Cast: NONE SEEN /LPF

## 2019-12-23 LAB — URINE CULTURE
MICRO NUMBER:: 11274549
SPECIMEN QUALITY:: ADEQUATE

## 2019-12-24 ENCOUNTER — Other Ambulatory Visit: Payer: Self-pay | Admitting: Internal Medicine

## 2019-12-24 ENCOUNTER — Other Ambulatory Visit: Payer: Self-pay

## 2019-12-24 ENCOUNTER — Other Ambulatory Visit: Payer: 59 | Admitting: Internal Medicine

## 2019-12-24 ENCOUNTER — Other Ambulatory Visit: Payer: Self-pay | Admitting: Cardiology

## 2019-12-24 DIAGNOSIS — E039 Hypothyroidism, unspecified: Secondary | ICD-10-CM

## 2019-12-24 DIAGNOSIS — E8881 Metabolic syndrome: Secondary | ICD-10-CM

## 2019-12-24 DIAGNOSIS — Z6830 Body mass index (BMI) 30.0-30.9, adult: Secondary | ICD-10-CM

## 2019-12-24 DIAGNOSIS — I10 Essential (primary) hypertension: Secondary | ICD-10-CM

## 2019-12-24 DIAGNOSIS — E669 Obesity, unspecified: Secondary | ICD-10-CM

## 2019-12-25 ENCOUNTER — Encounter (INDEPENDENT_AMBULATORY_CARE_PROVIDER_SITE_OTHER): Payer: Self-pay | Admitting: Family Medicine

## 2019-12-25 ENCOUNTER — Ambulatory Visit (INDEPENDENT_AMBULATORY_CARE_PROVIDER_SITE_OTHER): Payer: 59 | Admitting: Family Medicine

## 2019-12-25 VITALS — BP 94/66 | HR 79 | Temp 98.0°F | Ht 62.0 in | Wt 172.0 lb

## 2019-12-25 DIAGNOSIS — Z6831 Body mass index (BMI) 31.0-31.9, adult: Secondary | ICD-10-CM

## 2019-12-25 DIAGNOSIS — E8881 Metabolic syndrome: Secondary | ICD-10-CM | POA: Diagnosis not present

## 2019-12-25 DIAGNOSIS — E038 Other specified hypothyroidism: Secondary | ICD-10-CM | POA: Diagnosis not present

## 2019-12-25 DIAGNOSIS — E7849 Other hyperlipidemia: Secondary | ICD-10-CM | POA: Diagnosis not present

## 2019-12-25 DIAGNOSIS — E669 Obesity, unspecified: Secondary | ICD-10-CM

## 2019-12-25 DIAGNOSIS — Z9189 Other specified personal risk factors, not elsewhere classified: Secondary | ICD-10-CM

## 2019-12-25 LAB — LIPID PANEL
Cholesterol: 190 mg/dL (ref <200)
HDL: 88 mg/dL (ref >=50)
LDL Cholesterol (Calc): 84 mg/dL (calc)
Non-HDL Cholesterol (Calc): 102 mg/dL (calc) (ref <130)
Total CHOL/HDL Ratio: 2.2 (calc) (ref <5.0)
Triglycerides: 89 mg/dL (ref <150)

## 2019-12-25 LAB — HEMOGLOBIN A1C
Hgb A1c MFr Bld: 5.3 % of total Hgb (ref <5.7)
Mean Plasma Glucose: 105 mg/dL
eAG (mmol/L): 5.8 mmol/L

## 2019-12-25 LAB — HEPATIC FUNCTION PANEL
AG Ratio: 1.7 (calc) (ref 1.0–2.5)
ALT: 11 U/L (ref 6–29)
AST: 18 U/L (ref 10–35)
Albumin: 4.7 g/dL (ref 3.6–5.1)
Alkaline phosphatase (APISO): 80 U/L (ref 37–153)
Bilirubin, Direct: 0.2 mg/dL (ref 0.0–0.2)
Globulin: 2.7 g/dL (calc) (ref 1.9–3.7)
Indirect Bilirubin: 0.6 mg/dL (calc) (ref 0.2–1.2)
Total Bilirubin: 0.8 mg/dL (ref 0.2–1.2)
Total Protein: 7.4 g/dL (ref 6.1–8.1)

## 2019-12-25 LAB — TSH: TSH: 5.45 mIU/L — ABNORMAL HIGH

## 2019-12-25 MED ORDER — METFORMIN HCL 500 MG PO TABS: 500.0000 mg | ORAL_TABLET | Freq: Two times a day (BID) | ORAL | 0 refills | Status: DC

## 2019-12-25 NOTE — Telephone Encounter (Signed)
Refill sent to pharmacy.   

## 2019-12-26 NOTE — Progress Notes (Signed)
Chief Complaint:   OBESITY Rebecca Mccoy is here to discuss her progress with her obesity treatment plan along with follow-up of her obesity related diagnoses.   Today's visit was #: 11 Starting weight: 183 lbs Starting date: 04/03/2019 Today's weight: 172 lbs Today's date: 12/25/2019 Total lbs lost to date: 11 lbs Body mass index is 31.46 kg/m.  Total weight loss percentage to date: -6.01%  Interim History: Rebecca Mccoy says that Rebecca Mccoy is causing nausea.  She also says she has had increased stress due to family.  Nutrition Plan: the Category 1 Plan for 75% of the time.  Anti-obesity medications: Saxenda 3 mg subcutaneously daily and metformin 500 mg daily. Reported side effects: Nausea. Activity: Walking for 2 miles 4-5 times per week.  Assessment/Plan:   1. Other specified hypothyroidism Course: Elevated TSH.  Medication: levothyroxine 112 mcg daily.   Lab Results  Component Value Date   TSH 5.45 (H) 12/24/2019   Plan: Patient was instructed not to take MVM or iron within 4 hours of taking thyroid medications. We will continue to monitor symptoms as they relate to her weight loss journey.  2. Other hyperlipidemia Improved.  Lipid-lowering medications: Crestor 5 mg daily.   Plan: Dietary changes: Increase soluble fiber. Decrease simple carbohydrates. Exercise changes: An average 40 minutes of moderate to vigorous-intensity aerobic activity 3 or 4 times per week.   Lab Results  Component Value Date   CHOL 190 12/24/2019   HDL 88 12/24/2019   LDLCALC 84 12/24/2019   TRIG 89 12/24/2019   CHOLHDL 2.2 12/24/2019   Lab Results  Component Value Date   ALT 11 12/24/2019   AST 18 12/24/2019   ALKPHOS 82 05/28/2019   BILITOT 0.8 12/24/2019   The 10-year ASCVD risk score Rebecca Bussing DC Jr., et al., 2013) is: 1.5%   Values used to calculate the score:     Age: 54 years     Sex: Female     Is Non-Hispanic African American: No     Diabetic: Yes     Tobacco smoker: No     Systolic  Blood Pressure: 94 mmHg     Is BP treated: Yes     HDL Cholesterol: 88 mg/dL     Total Cholesterol: 190 mg/dL  3. Insulin resistance Controlled. Goal is HgbA1c < 5.7, fasting insulin closer to 5.  Medication: metformin 500 mg daily.  She will continue to focus on protein-rich, low simple carbohydrate foods. We reviewed the importance of hydration, regular exercise for stress reduction, and restorative sleep.   Lab Results  Component Value Date   HGBA1C 5.3 12/24/2019   Lab Results  Component Value Date   INSULIN 10.7 04/03/2019   - Refill metFORMIN (GLUCOPHAGE) 500 MG tablet; Take 1 tablet (500 mg total) by mouth 2 (two) times daily.  Dispense: 180 tablet; Refill: 0  4. At risk for heart disease Shreena was given approximately 8 minutes of coronary artery disease prevention counseling today. She is 54 y.o. female and has risk factors for heart disease including obesity and IR. We discussed intensive lifestyle modifications today with an emphasis on specific weight loss instructions and strategies. Repetitive spaced learning was employed today to elicit superior memory formation and behavioral change.  During insulin resistance, several metabolic alterations induce the development of cardiovascular disease. For instance, insulin resistance can induce an imbalance in glucose metabolism that generates chronic hyperglycemia, which in turn triggers oxidative stress and causes an inflammatory response that leads to cell damage. Insulin resistance  can also alter systemic lipid metabolism which then leads to the development of dyslipidemia and the well-known lipid triad: (1) high levels of plasma triglycerides, (2) low levels of high-density lipoprotein, and (3) the appearance of small dense low-density lipoproteins. This triad, along with endothelial dysfunction, which can also be induced by aberrant insulin signaling, contribute to atherosclerotic plaque formation.   5. Class 1 obesity with serious  comorbidity and body mass index (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, unspecified obesity type  Course: [name] is currently in the action stage of change. As such, her goal is to continue with weight loss efforts.   Nutrition goals: She has agreed to the Category 1 Plan.   Exercise goals: For substantial health benefits, adults should do at least 150 minutes (2 hours and 30 minutes) a week of moderate-intensity, or 75 minutes (1 hour and 15 minutes) a week of vigorous-intensity aerobic physical activity, or an equivalent combination of moderate- and vigorous-intensity aerobic activity. Aerobic activity should be performed in episodes of at least 10 minutes, and preferably, it should be spread throughout the week.  Behavioral modification strategies: increasing lean protein intake, decreasing simple carbohydrates, increasing vegetables and increasing water intake.  Rebecca Mccoy has agreed to follow-up with our clinic in 4 weeks. She was informed of the importance of frequent follow-up visits to maximize her success with intensive lifestyle modifications for her multiple health conditions.   Objective:   Blood pressure 94/66, pulse 79, temperature 98 F (36.7 C), temperature source Oral, height 5\' 2"  (1.575 m), weight 172 lb (78 kg), SpO2 96 %. Body mass index is 31.46 kg/m.  General: Cooperative, alert, well developed, in no acute distress. HEENT: Conjunctivae and lids unremarkable. Cardiovascular: Regular rhythm.  Lungs: Normal work of breathing. Neurologic: No focal deficits.   Lab Results  Component Value Date   CREATININE 1.00 06/22/2019   BUN 15 06/22/2019   NA 140 06/22/2019   K 4.6 06/22/2019   CL 101 06/22/2019   CO2 33 (H) 06/22/2019   Lab Results  Component Value Date   ALT 11 12/24/2019   AST 18 12/24/2019   ALKPHOS 82 05/28/2019   BILITOT 0.8 12/24/2019   Lab Results  Component Value Date   HGBA1C 5.3 12/24/2019   HGBA1C 5.3 06/22/2019   HGBA1C 5.4 01/31/2019   HGBA1C 5.5  08/10/2018   HGBA1C 5.2 07/30/2016   Lab Results  Component Value Date   INSULIN 10.7 04/03/2019   Lab Results  Component Value Date   TSH 5.45 (H) 12/24/2019   Lab Results  Component Value Date   CHOL 190 12/24/2019   HDL 88 12/24/2019   LDLCALC 84 12/24/2019   TRIG 89 12/24/2019   CHOLHDL 2.2 12/24/2019   Lab Results  Component Value Date   WBC 5.4 06/22/2019   HGB 12.9 06/22/2019   HCT 39.5 06/22/2019   MCV 93.2 06/22/2019   PLT 293 06/22/2019   Lab Results  Component Value Date   IRON 108 05/28/2019   TIBC 363 05/28/2019   FERRITIN 36 05/28/2019   Attestation Statements:   Reviewed by clinician on day of visit: allergies, medications, problem list, medical history, surgical history, family history, social history, and previous encounter notes.  I, Water quality scientist, CMA, am acting as transcriptionist for Briscoe Deutscher, DO  I have reviewed the above documentation for accuracy and completeness, and I agree with the above. Briscoe Deutscher, DO

## 2019-12-27 ENCOUNTER — Ambulatory Visit (INDEPENDENT_AMBULATORY_CARE_PROVIDER_SITE_OTHER): Payer: 59 | Admitting: Internal Medicine

## 2019-12-27 ENCOUNTER — Other Ambulatory Visit: Payer: Self-pay

## 2019-12-27 ENCOUNTER — Encounter: Payer: Self-pay | Admitting: Internal Medicine

## 2019-12-27 VITALS — BP 100/70 | HR 88 | Ht 62.0 in | Wt 178.0 lb

## 2019-12-27 DIAGNOSIS — R7302 Impaired glucose tolerance (oral): Secondary | ICD-10-CM

## 2019-12-27 DIAGNOSIS — F5101 Primary insomnia: Secondary | ICD-10-CM

## 2019-12-27 DIAGNOSIS — N390 Urinary tract infection, site not specified: Secondary | ICD-10-CM | POA: Diagnosis not present

## 2019-12-27 DIAGNOSIS — E8881 Metabolic syndrome: Secondary | ICD-10-CM

## 2019-12-27 DIAGNOSIS — B9620 Unspecified Escherichia coli [E. coli] as the cause of diseases classified elsewhere: Secondary | ICD-10-CM

## 2019-12-27 DIAGNOSIS — R7989 Other specified abnormal findings of blood chemistry: Secondary | ICD-10-CM

## 2019-12-27 DIAGNOSIS — Z6832 Body mass index (BMI) 32.0-32.9, adult: Secondary | ICD-10-CM

## 2019-12-27 MED ORDER — ALPRAZOLAM 1 MG PO TABS: 1.0000 mg | ORAL_TABLET | Freq: Two times a day (BID) | ORAL | 0 refills | Status: DC | PRN

## 2019-12-27 MED ORDER — LEVOTHYROXINE SODIUM 125 MCG PO TABS: 125.0000 ug | ORAL_TABLET | Freq: Every day | ORAL | 0 refills | Status: DC

## 2019-12-27 NOTE — Patient Instructions (Addendum)
It was a pleasure to see you today.  Increase Xanax to 1 mg daily 30 minutes before bedtime.  Increase levothyroxine to 0.125 mg daily and follow-up in 6 weeks.  UTI symptoms have cleared.  Continue diet and exercise efforts.

## 2019-12-27 NOTE — Progress Notes (Signed)
   Subjective:    Patient ID: Rebecca Mccoy, female    DOB: [birth year], 54 y.o.   MRN: [id number]  HPI Having issues with sleep. Takes Xanax 0.5mg  nightly and now taking longer to fall asleep. Has taken this for insomnia or 4-5  Years. Has reached plateau with weight loss.  Also, has hypothyroidism and TSH is elevated.  Denies missing dosages of levothyroxine.  Says that she is seen at [facility] and feels that weight has not made much improvement recently.  Says she has been taking levothyroxine on an empty stomach.  TSH is elevated at 5.45 on levothyroxine 0.112 mg daily.  This will be increased to 0.125 mg daily with follow-up January 24.  Recently treated for E. coli UTI on December 3 treated with Cipro for 7 days.  Recent labs including lipid, liver, and AIC are all normal.  Review of Systems see above-feels that weight is not coming off as easily as it should.  Does not feel worried or stressed at this point.  Has been taking Xanax for several years since she remarried.     Objective:   Physical Exam  BP 100/70, pulse 88, pulse ox 94%  No thyromegaly.  Weight is 178 pounds.  BMI 32.56.  In November, at [facility] weight was 172 pounds with BMI of 31.46.  On December 3 here at the office weight was 176 pounds.  At that time she was treated for UTI.     Assessment & Plan:  Recent E. coli UTI-treated with Cipro for 7 days with good sensitivity.  Asymptomatic at the present time.  Lack of progress with weight loss.  TSH is elevated.  Denies noncompliance with medication.  Increase levothyroxine to 0.125 mg daily and follow-up in 6 weeks.  Insomnia-having trouble falling asleep on Xanax 0.5 mg daily.  Increase to 1 mg daily at least 30 minutes before bedtime but had discussion with her about benzodiazepines and tolerance with these medications.  She feels that she needs something to fall asleep.  Sounds like it is anxiety driven.  Plan: She is to return  January 24 for follow-up on hypothyroidism/elevated TSH.

## 2019-12-28 LAB — MICROALBUMIN / CREATININE URINE RATIO
Creatinine, Urine: 96 mg/dL (ref 20–275)
Microalb Creat Ratio: 5 mcg/mg creat (ref <30)
Microalb, Ur: 0.5 mg/dL

## 2020-01-09 NOTE — Patient Instructions (Signed)
Urine culture sent.  Take Cipro 500 mg twice daily for 7 days.  Drink plenty of fluids.  Return on December 9.

## 2020-01-23 ENCOUNTER — Other Ambulatory Visit: Payer: Self-pay

## 2020-01-23 ENCOUNTER — Encounter (INDEPENDENT_AMBULATORY_CARE_PROVIDER_SITE_OTHER): Payer: Self-pay | Admitting: Family Medicine

## 2020-01-23 ENCOUNTER — Ambulatory Visit (INDEPENDENT_AMBULATORY_CARE_PROVIDER_SITE_OTHER): Payer: 59 | Admitting: Family Medicine

## 2020-01-23 VITALS — BP 100/69 | HR 75 | Temp 97.9°F | Ht 62.0 in | Wt 173.0 lb

## 2020-01-23 DIAGNOSIS — E038 Other specified hypothyroidism: Secondary | ICD-10-CM

## 2020-01-23 DIAGNOSIS — R948 Abnormal results of function studies of other organs and systems: Secondary | ICD-10-CM

## 2020-01-23 DIAGNOSIS — Z9189 Other specified personal risk factors, not elsewhere classified: Secondary | ICD-10-CM

## 2020-01-23 DIAGNOSIS — K5909 Other constipation: Secondary | ICD-10-CM | POA: Diagnosis not present

## 2020-01-23 DIAGNOSIS — E669 Obesity, unspecified: Secondary | ICD-10-CM

## 2020-01-23 DIAGNOSIS — Z8679 Personal history of other diseases of the circulatory system: Secondary | ICD-10-CM

## 2020-01-23 DIAGNOSIS — Z6831 Body mass index (BMI) 31.0-31.9, adult: Secondary | ICD-10-CM

## 2020-01-24 MED ORDER — LINACLOTIDE 145 MCG PO CAPS: 145.0000 ug | ORAL_CAPSULE | Freq: Every day | ORAL | 0 refills | Status: DC | PRN

## 2020-01-24 NOTE — Progress Notes (Signed)
Chief Complaint:   OBESITY Rebecca Mccoy is here to discuss her progress with her obesity treatment plan along with follow-up of her obesity related diagnoses.   Today's visit was #: 12 Starting weight: 183 lbs Starting date: 04/03/2019 Today's weight: 173 lbs Today's date: 01/23/2020 Total lbs lost to date: 10 lbs Body mass index is 31.64 kg/m.  Total weight loss percentage to date: -5.46%  Interim History: Rebecca Mccoy is on a 5 day fast - fruits/vegetables.  She says she is frustrated with her plateau. Nutrition Plan: the Category 1 Plan for 85% of the time.  Activity: Walking 2-4 miles 5-6 times per week.  Assessment/Plan:   1. Chronic constipation Counseling Getting to Good Bowel Health: Your goal is to have one soft bowel movement each day. Drink at least 8 glasses of water each day. Eat plenty of fiber (goal is over 25 grams each day). It is best to get most of your fiber from dietary sources which includes leafy green vegetables, fresh fruit, and whole grains. You may need to add fiber with the help of OTC fiber supplements. These include Metamucil, Citrucel, and Benefiber. If you are still having trouble, try adding Miralax or Magnesium Citrate. If all of these changes do not work, contact me.  - Start linaclotide (LINZESS) 145 MCG CAPS capsule; Take 1 capsule (145 mcg total) by mouth daily as needed (constipation).  Dispense: 30 capsule; Refill: 0  2. Abnormal metabolism Rebecca Mccoy's metabolism is lower than normal. We discussed strength training, restful sleep, adequate protein, and considering medication adjustment.  3. Other specified hypothyroidism Medication: levothyroxine 125 mcg daily.  This was increased 4 weeks ago.  She has a recheck scheduled in 2 weeks.  Lab Results  Component Value Date   TSH 5.45 (H) 12/24/2019   Plan: Patient was instructed not to take MVM or iron within 4 hours of taking thyroid medications. We will continue to monitor symptoms as they relate to her  weight loss journey.  4. Personal history of supraventricular tachycardia Taking beta blocker.  She has low blood pressure.    Plan:  May consider lowering beta blocker.  Encouraged her to call Cardiology for follow-up appointment.   5. At risk for heart disease Rebecca Mccoy was given approximately 8 minutes of coronary artery disease prevention counseling today. She is 55 y.o. female and has risk factors for heart disease including obesity and IR. We discussed intensive lifestyle modifications today with an emphasis on specific weight loss instructions and strategies. Repetitive spaced learning was employed today to elicit superior memory formation and behavioral change.  During insulin resistance, several metabolic alterations induce the development of cardiovascular disease. For instance, insulin resistance can induce an imbalance in glucose metabolism that generates chronic hyperglycemia, which in turn triggers oxidative stress and causes an inflammatory response that leads to cell damage. Insulin resistance can also alter systemic lipid metabolism which then leads to the development of dyslipidemia and the well-known lipid triad: (1) high levels of plasma triglycerides, (2) low levels of high-density lipoprotein, and (3) the appearance of small dense low-density lipoproteins. This triad, along with endothelial dysfunction, which can also be induced by aberrant insulin signaling, contribute to atherosclerotic plaque formation.   6. Class 1 obesity with serious comorbidity and body mass index (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, unspecified obesity type  Course: Rebecca Mccoy is currently in the action stage of change. As such, her goal is to continue with weight loss efforts.   Nutrition goals: She has agreed  to the Category 1 Plan.   Exercise goals: As is.  Behavioral modification strategies: increasing lean protein intake, decreasing simple carbohydrates, increasing vegetables and increasing water  intake.  Rebecca Mccoy has agreed to follow-up with our clinic in 4 weeks. She was informed of the importance of frequent follow-up visits to maximize her success with intensive lifestyle modifications for her multiple health conditions.   Objective:   Blood pressure 100/69, pulse 75, temperature 97.9 F (36.6 C), temperature source Oral, height 5\' 2"  (1.575 m), weight 173 lb (78.5 kg), SpO2 98 %. Body mass index is 31.64 kg/m.  General: Cooperative, alert, well developed, in no acute distress. HEENT: Conjunctivae and lids unremarkable. Cardiovascular: Regular rhythm.  Lungs: Normal work of breathing. Neurologic: No focal deficits.   Lab Results  Component Value Date   CREATININE 1.00 06/22/2019   BUN 15 06/22/2019   NA 140 06/22/2019   K 4.6 06/22/2019   CL 101 06/22/2019   CO2 33 (H) 06/22/2019   Lab Results  Component Value Date   ALT 11 12/24/2019   AST 18 12/24/2019   ALKPHOS 82 05/28/2019   BILITOT 0.8 12/24/2019   Lab Results  Component Value Date   HGBA1C 5.3 12/24/2019   HGBA1C 5.3 06/22/2019   HGBA1C 5.4 01/31/2019   HGBA1C 5.5 08/10/2018   HGBA1C 5.2 07/30/2016   Lab Results  Component Value Date   INSULIN 10.7 04/03/2019   Lab Results  Component Value Date   TSH 5.45 (H) 12/24/2019   Lab Results  Component Value Date   CHOL 190 12/24/2019   HDL 88 12/24/2019   LDLCALC 84 12/24/2019   TRIG 89 12/24/2019   CHOLHDL 2.2 12/24/2019   Lab Results  Component Value Date   WBC 5.4 06/22/2019   HGB 12.9 06/22/2019   HCT 39.5 06/22/2019   MCV 93.2 06/22/2019   PLT 293 06/22/2019   Lab Results  Component Value Date   IRON 108 05/28/2019   TIBC 363 05/28/2019   FERRITIN 36 05/28/2019   Attestation Statements:   Reviewed by clinician on day of visit: allergies, medications, problem list, medical history, surgical history, family history, social history, and previous encounter notes.  I, Water quality scientist, CMA, am acting as transcriptionist for Briscoe Deutscher,  DO  I have reviewed the above documentation for accuracy and completeness, and I agree with the above. Briscoe Deutscher, DO

## 2020-01-25 ENCOUNTER — Encounter (HOSPITAL_BASED_OUTPATIENT_CLINIC_OR_DEPARTMENT_OTHER): Payer: 59

## 2020-01-25 DIAGNOSIS — R011 Cardiac murmur, unspecified: Secondary | ICD-10-CM | POA: Insufficient documentation

## 2020-01-25 DIAGNOSIS — F419 Anxiety disorder, unspecified: Secondary | ICD-10-CM | POA: Insufficient documentation

## 2020-01-25 DIAGNOSIS — K219 Gastro-esophageal reflux disease without esophagitis: Secondary | ICD-10-CM | POA: Insufficient documentation

## 2020-01-25 DIAGNOSIS — Z1231 Encounter for screening mammogram for malignant neoplasm of breast: Secondary | ICD-10-CM

## 2020-01-25 DIAGNOSIS — N739 Female pelvic inflammatory disease, unspecified: Secondary | ICD-10-CM | POA: Insufficient documentation

## 2020-01-25 DIAGNOSIS — G43909 Migraine, unspecified, not intractable, without status migrainosus: Secondary | ICD-10-CM | POA: Insufficient documentation

## 2020-01-25 DIAGNOSIS — L509 Urticaria, unspecified: Secondary | ICD-10-CM | POA: Insufficient documentation

## 2020-01-25 DIAGNOSIS — E7800 Pure hypercholesterolemia, unspecified: Secondary | ICD-10-CM | POA: Insufficient documentation

## 2020-01-25 DIAGNOSIS — G4730 Sleep apnea, unspecified: Secondary | ICD-10-CM | POA: Insufficient documentation

## 2020-01-25 DIAGNOSIS — I499 Cardiac arrhythmia, unspecified: Secondary | ICD-10-CM | POA: Insufficient documentation

## 2020-01-25 DIAGNOSIS — R609 Edema, unspecified: Secondary | ICD-10-CM | POA: Insufficient documentation

## 2020-01-25 DIAGNOSIS — J069 Acute upper respiratory infection, unspecified: Secondary | ICD-10-CM | POA: Insufficient documentation

## 2020-01-25 DIAGNOSIS — E079 Disorder of thyroid, unspecified: Secondary | ICD-10-CM | POA: Insufficient documentation

## 2020-01-25 DIAGNOSIS — I10 Essential (primary) hypertension: Secondary | ICD-10-CM | POA: Insufficient documentation

## 2020-01-28 ENCOUNTER — Encounter: Payer: Self-pay | Admitting: Cardiology

## 2020-01-28 ENCOUNTER — Other Ambulatory Visit: Payer: Self-pay

## 2020-01-28 ENCOUNTER — Ambulatory Visit: Payer: 59 | Admitting: Cardiology

## 2020-01-28 VITALS — BP 118/72 | HR 83 | Ht 62.0 in | Wt 177.0 lb

## 2020-01-28 DIAGNOSIS — E785 Hyperlipidemia, unspecified: Secondary | ICD-10-CM

## 2020-01-28 DIAGNOSIS — I10 Essential (primary) hypertension: Secondary | ICD-10-CM

## 2020-01-28 DIAGNOSIS — I471 Supraventricular tachycardia, unspecified: Secondary | ICD-10-CM

## 2020-01-28 DIAGNOSIS — G4733 Obstructive sleep apnea (adult) (pediatric): Secondary | ICD-10-CM

## 2020-01-28 NOTE — Patient Instructions (Signed)
Medication Instructions:  Your physician has recommended you make the following change in your medication:   STOP: Metoprolol  *If you need a refill on your cardiac medications before your next appointment, please call your pharmacy*   Lab Work: None If you have labs (blood work) drawn today and your tests are completely normal, you will receive your results only by: Marland Kitchen MyChart Message (if you have MyChart) OR . A paper copy in the mail If you have any lab test that is abnormal or we need to change your treatment, we will call you to review the results.   Testing/Procedures: None   Follow-Up: At [facility], you and your health needs are our priority.  As part of our continuing mission to provide you with exceptional heart care, we have created designated Provider Care Teams.  These Care Teams include your primary Cardiologist (physician) and Advanced Practice Providers (APPs -  Physician Assistants and Nurse Practitioners) who all work together to provide you with the care you need, when you need it.  We recommend signing up for the patient portal called "MyChart".  Sign up information is provided on this After Visit Summary.  MyChart is used to connect with patients for Virtual Visits (Telemedicine).  Patients are able to view lab/test results, encounter notes, upcoming appointments, etc.  Non-urgent messages can be sent to your provider as well.   To learn more about what you can do with MyChart, go to [email or web address].    Your next appointment:   6 month(s)  The format for your next appointment:   In Person  Provider:   Jenne Campus, MD   Other Instructions

## 2020-01-28 NOTE — Progress Notes (Signed)
Cardiology Office Note:    Date:  01/28/2020   ID:  Rebecca Mccoy, DOB 1965-10-16, MRN [id number]  PCP:  Elby Showers, MD  Cardiologist:  Jenne Campus, MD    Referring MD: Elby Showers, MD   Chief Complaint  Patient presents with  . Want to discuss d/c Metoporlol  . Fatigue  I am weak tired and exhausted  History of Present Illness:    Rebecca Mccoy is a 55 y.o. female with hospital history significant for supraventricular tachycardia, essential hypertension, dyslipidemia, obstructive sleep apnea.  Comes today 2 months for follow-up.  She wants to stop her metoprolol.  She thinks that that will make her feel weak tired exhausted she also noted that her blood pressure sometimes is slow.  She did have a trial of discontinuation of Maxide but that resulted his weight gain and she does not like it.  Denies having palpitations no chest pain tightness squeezing pressure burning chest.  She walks a lot for her work and type and that she is trying to do rowing machine at home as well as some walking.  Denies having any difficulty doing it.  She is also concerned about some weight gain.  She is unable to lose weight.  Recently she was diagnosed and recognized that her thyroid has not been well controlled on dose of Synthroid has been increased.  Past Medical History:  Diagnosis Date  . Anxiety   . Anxiety and depression 02/17/2014  . 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 08/14/2019  . Dyslipidemia 07/03/2018  . Fluid retention   . GERD (gastroesophageal reflux disease)   . GERD (gastroesophageal reflux disease)   . Heart murmur    infant  . High cholesterol   . History of migraine headaches 02/17/2014  . Hx of adenomatous polyp of colon 07/09/2016   Sessile serrated polyp 11/17  . Hypertension   . Hypothyroidism 12/03/2015  . Insomnia 07/30/2016  . Irregular heart beat   . Migraines   . Non-allergic rhinitis 06/10/2017  . OSA (obstructive  sleep apnea) 10/04/2017  . PID (pelvic inflammatory disease)   . Reactive airways dysfunction syndrome ([place]) 06/10/2017  . Recurrent upper respiratory infection (URI)    April 2019  . Sleep apnea   . Supraventricular tachycardia ([place]) 08/22/2018  . Thyroid disease   . Urticaria     Past Surgical History:  Procedure Laterality Date  . AUGMENTATION MAMMAPLASTY Bilateral 2005  . BREAST SURGERY  2005   Augmentation  . CARPAL TUNNEL RELEASE Bilateral    R in '07 and L '06  . COSMETIC SURGERY    . leg lift    . TONSILLECTOMY  1972  . TUBAL LIGATION  1989  . tummy tuck  2005    Current Medications: Current Meds  Medication Sig  . albuterol (PROVENTIL HFA;VENTOLIN HFA) 108 (90 Base) MCG/ACT inhaler Inhale 1-2 puffs into the lungs every 6 (six) hours as needed for wheezing or shortness of breath.  . ALPRAZolam (XANAX) 1 MG tablet Take 1 tablet (1 mg total) by mouth 2 (two) times daily as needed for anxiety.  . B Complex Vitamins (B COMPLEX 100 PO) Take 1 capsule by mouth daily.  Marland Kitchen buPROPion (WELLBUTRIN XL) 150 MG 24 hr tablet TAKE 1 TABLET(150 MG) BY MOUTH DAILY  . Ergocalciferol (VITAMIN D2) 50 MCG (2000 UT) TABS Take 1 tablet by mouth daily.  Marland Kitchen glucosamine-chondroitin 500-400 MG tablet Take 1 tablet  by mouth daily.  . Korean Panax Ginseng 100 MG CAPS Take 1 capsule by mouth daily.  Marland Kitchen levothyroxine (SYNTHROID) 125 MCG tablet Take 1 tablet (125 mcg total) by mouth daily.  Marland Kitchen linaclotide (LINZESS) 145 MCG CAPS capsule Take 1 capsule (145 mcg total) by mouth daily as needed (constipation).  . metFORMIN (GLUCOPHAGE) 500 MG tablet Take 1 tablet (500 mg total) by mouth 2 (two) times daily.  . metoprolol succinate (TOPROL-XL) 25 MG 24 hr tablet Take 1 tablet (25 mg total) by mouth daily. Needs an office visit for further refills.  . Omega-3 Fatty Acids (FISH OIL) 1000 MG CAPS Take 2 capsules by mouth daily.  . Probiotic Product (PROBIOTIC DAILY PO) Take by mouth.  . rosuvastatin (CRESTOR) 5 MG  tablet TAKE 1 TABLET BY MOUTH THREE TIMES A WEEK  . SYMBICORT 160-4.5 MCG/ACT inhaler INHALE 2 PUFFS INTO THE LUNGS TWICE DAILY  . triamterene-hydrochlorothiazide (MAXZIDE-25) 37.5-25 MG tablet Take 1 tablet by mouth daily. (Patient taking differently: Take 0.5 tablets by mouth daily.)  . TURMERIC PO Take 1 tablet daily by mouth.     Allergies:   Patient has no known allergies.   Social History   Socioeconomic History  . Marital status: Married    Spouse name: Not on file  . Number of children: 2  . Years of education: Not on file  . Highest education level: Not on file  Occupational History  . Occupation: Garment/textile technologist  Tobacco Use  . Smoking status: Never Smoker  . Smokeless tobacco: Never Used  Vaping Use  . Vaping Use: Never used  Substance and Sexual Activity  . Alcohol use: Yes    Alcohol/week: 0.0 - 2.0 standard drinks    Comment: 1-2 beer weekly  . Drug use: No  . Sexual activity: Yes    Partners: Male    Birth control/protection: Surgical, Post-menopausal    Comment: INTERCOUSE AGE 38, SEXUAL PARTNERS MORE THAN 5  Other Topics Concern  . Not on file  Social History Narrative  . Not on file   Social Determinants of Health   Financial Resource Strain: Not on file  Food Insecurity: Not on file  Transportation Needs: Not on file  Physical Activity: Not on file  Stress: Not on file  Social Connections: Not on file     Family History: The patient's family history includes Allergic rhinitis in her mother; Colon polyps in her mother; Crohn's disease in her cousin; Diabetes in her maternal grandfather, maternal grandmother, and mother; Hyperlipidemia in her father; Hypertension in her father and mother; Obesity in her mother; Pancreatic cancer in her father; Stroke in her maternal grandmother and paternal grandmother; Thyroid disease in her mother; Uterine cancer in her mother. There is no history of Colon cancer, Stomach cancer, Esophageal cancer,  Rectal cancer, Liver cancer, Eczema, Urticaria, or Asthma. ROS:   Please see the history of present illness.    All 14 point review of systems negative except as described per history of present illness  EKGs/Labs/Other Studies Reviewed:      Recent Labs: 06/22/2019: BUN 15; Creat 1.00; Hemoglobin 12.9; Platelets 293; Potassium 4.6; Sodium 140 12/24/2019: ALT 11; TSH 5.45  Recent Lipid Panel    Component Value Date/Time   CHOL 190 12/24/2019 0938   TRIG 89 12/24/2019 0938   HDL 88 12/24/2019 0938   CHOLHDL 2.2 12/24/2019 0938   VLDL 22 07/26/2016 0914   LDLCALC 84 12/24/2019 0938    Physical Exam:    VS:  BP 118/72 (BP Location: Right Arm, Patient Position: Sitting)   Pulse 83   Ht 5\' 2"  (1.575 m)   Wt 177 lb (80.3 kg)   LMP  (LMP Unknown)   SpO2 94%   BMI 32.37 kg/m     Wt Readings from Last 3 Encounters:  01/28/20 177 lb (80.3 kg)  01/23/20 173 lb (78.5 kg)  12/27/19 178 lb (80.7 kg)     GEN:  Well nourished, well developed in no acute distress HEENT: Normal NECK: No JVD; No carotid bruits LYMPHATICS: No lymphadenopathy CARDIAC: RRR, no murmurs, no rubs, no gallops RESPIRATORY:  Clear to auscultation without rales, wheezing or rhonchi  ABDOMEN: Soft, non-tender, non-distended MUSCULOSKELETAL:  No edema; No deformity  SKIN: Warm and dry LOWER EXTREMITIES: no swelling NEUROLOGIC:  Alert and oriented x 3 PSYCHIATRIC:  Normal affect   ASSESSMENT:    1. Supraventricular tachycardia ([place])   2. Primary hypertension   3. OSA (obstructive sleep apnea)   4. Dyslipidemia    PLAN:    In order of problems listed above:  1. Supraventricular tachycardia she takes very small dose of beta-blocker which seems to be controlling her arrhythmia, however, she thinks metoprolol makes her feel bad.  And she wants to stop it.  Since this is only 25 mg daily I asked her to stop it and we will see how she will do. 2. Essential hypertension there was some issue with blood pressure  being low, trial of discontinuation of Maxide resulted in weight gain that she does not like and she want to continue Maxide.  Since her blood pressure is low we will discontinue her metoprolol. 3. Obstructive sleep apnea: The followed by internal medicine team. 4. Dyslipidemia I did review her K PN which show me her LDL of 84 and HDL 88 this is from 12/24/2019 and this is on Crestor 5 which I will continue. 5. We did talk about healthy lifestyle need to exercise good diet which she understands.   Medication Adjustments/Labs and Tests Ordered: Current medicines are reviewed at length with the patient today.  Concerns regarding medicines are outlined above.  No orders of the defined types were placed in this encounter.  Medication changes: No orders of the defined types were placed in this encounter.   Signed, Park Liter, MD, [facility] 01/28/2020 10:48 AM    [place]

## 2020-01-28 NOTE — Addendum Note (Signed)
Addended by: Senaida Ores on: 01/28/2020 10:53 AM   Modules accepted: Orders

## 2020-02-06 ENCOUNTER — Other Ambulatory Visit: Payer: Self-pay | Admitting: Internal Medicine

## 2020-02-06 ENCOUNTER — Other Ambulatory Visit: Payer: Self-pay | Admitting: Cardiology

## 2020-02-06 DIAGNOSIS — I10 Essential (primary) hypertension: Secondary | ICD-10-CM

## 2020-02-07 ENCOUNTER — Other Ambulatory Visit: Payer: Self-pay

## 2020-02-07 ENCOUNTER — Other Ambulatory Visit: Payer: 59 | Admitting: Internal Medicine

## 2020-02-07 ENCOUNTER — Other Ambulatory Visit: Payer: Self-pay | Admitting: Internal Medicine

## 2020-02-07 DIAGNOSIS — R7989 Other specified abnormal findings of blood chemistry: Secondary | ICD-10-CM

## 2020-02-07 DIAGNOSIS — Z1231 Encounter for screening mammogram for malignant neoplasm of breast: Secondary | ICD-10-CM

## 2020-02-07 NOTE — Telephone Encounter (Signed)
Metoprolol denied

## 2020-02-08 LAB — TSH: TSH: 0.66 mIU/L

## 2020-02-11 ENCOUNTER — Encounter: Payer: Self-pay | Admitting: Internal Medicine

## 2020-02-11 ENCOUNTER — Ambulatory Visit: Payer: 59 | Admitting: Internal Medicine

## 2020-02-11 ENCOUNTER — Other Ambulatory Visit: Payer: Self-pay

## 2020-02-11 VITALS — BP 110/70 | HR 96 | Ht 62.0 in | Wt 176.0 lb

## 2020-02-11 DIAGNOSIS — R7989 Other specified abnormal findings of blood chemistry: Secondary | ICD-10-CM | POA: Diagnosis not present

## 2020-02-11 DIAGNOSIS — K5904 Chronic idiopathic constipation: Secondary | ICD-10-CM

## 2020-02-11 DIAGNOSIS — E039 Hypothyroidism, unspecified: Secondary | ICD-10-CM | POA: Diagnosis not present

## 2020-02-11 NOTE — Patient Instructions (Signed)
Continue with levothyroxine 0.125 mg daily.  Perhaps gastroenterologist can help you get Linzess approved by insurance company.  Colonoscopy is due December 2022.  Physical exam booked for June 2022 here.

## 2020-02-11 NOTE — Progress Notes (Signed)
   Subjective:    Patient ID: Rebecca Mccoy, female    DOB: 08-Apr-1965, 55 y.o.   MRN: [id number]  HPI 55 year old Female seen for follow up on elevated TSH.  TSH in early December was 5.45 and in June 2021 had been 1.76.  Was changed to levothyroxine 0.125 mg on December 27, 2019.  Repeat TSH is now normal.  Think she may have a little bit more energy on this dose.  Previously was on 0.112 mg daily.  Hypothyroid for years having been diagnosed when living in [place] to be followed at [facility].    History of chronic constipation.  Linzess is very expensive even with discount card.  Might be able to get this approved with Gastroenterology consultation.  Saw Dr. Ardis Hughs in 2017 for colonoscopy.  Is due for follow-up in December 4680 but we could certainly arrange for her to be seen by him sooner for this issue with constipation and Linzess.      Review of Systems see above     Objective:   Physical Exam  Blood pressure 110/70 pulse 96 pulse oximetry 97% weight 176 pounds BMI 32.19-by our scales has lost 2 pounds since December 9.  No thyromegaly.      Assessment & Plan:  Hypothyroidism-continue with levothyroxine 0.125 mg daily as TSH is normal on this dose at present time.  Remind patient to take medication on empty stomach and with no other medications.  CPE appointment booked for June 2022.  Idiopathic constipation-perhaps Dr. Ardis Hughs office could help her get Linzess approved by [employer].  Is due for colonoscopy December 2022.

## 2020-02-20 ENCOUNTER — Other Ambulatory Visit: Payer: Self-pay

## 2020-02-20 ENCOUNTER — Encounter (INDEPENDENT_AMBULATORY_CARE_PROVIDER_SITE_OTHER): Payer: Self-pay | Admitting: Family Medicine

## 2020-02-20 ENCOUNTER — Ambulatory Visit (INDEPENDENT_AMBULATORY_CARE_PROVIDER_SITE_OTHER): Payer: 59 | Admitting: Family Medicine

## 2020-02-20 VITALS — BP 108/72 | HR 85 | Temp 97.7°F | Ht 62.0 in | Wt 168.0 lb

## 2020-02-20 DIAGNOSIS — E8881 Metabolic syndrome: Secondary | ICD-10-CM

## 2020-02-20 DIAGNOSIS — E038 Other specified hypothyroidism: Secondary | ICD-10-CM

## 2020-02-20 DIAGNOSIS — Z9189 Other specified personal risk factors, not elsewhere classified: Secondary | ICD-10-CM

## 2020-02-20 DIAGNOSIS — E669 Obesity, unspecified: Secondary | ICD-10-CM

## 2020-02-20 DIAGNOSIS — E782 Mixed hyperlipidemia: Secondary | ICD-10-CM

## 2020-02-20 DIAGNOSIS — Z6830 Body mass index (BMI) 30.0-30.9, adult: Secondary | ICD-10-CM

## 2020-02-20 DIAGNOSIS — J300 Vasomotor rhinitis: Secondary | ICD-10-CM | POA: Diagnosis not present

## 2020-02-21 MED ORDER — AZELASTINE HCL 0.1 % NA SOLN: 1.0000 | Freq: Two times a day (BID) | NASAL | 0 refills | Status: DC

## 2020-02-24 ENCOUNTER — Encounter (HOSPITAL_BASED_OUTPATIENT_CLINIC_OR_DEPARTMENT_OTHER): Payer: Self-pay | Admitting: Emergency Medicine

## 2020-02-24 ENCOUNTER — Other Ambulatory Visit: Payer: Self-pay

## 2020-02-24 ENCOUNTER — Ambulatory Visit (HOSPITAL_COMMUNITY): Payer: Self-pay

## 2020-02-24 ENCOUNTER — Emergency Department (HOSPITAL_BASED_OUTPATIENT_CLINIC_OR_DEPARTMENT_OTHER): Payer: 59

## 2020-02-24 ENCOUNTER — Emergency Department (HOSPITAL_BASED_OUTPATIENT_CLINIC_OR_DEPARTMENT_OTHER)
Admission: EM | Admit: 2020-02-24 | Discharge: 2020-02-24 | Disposition: A | Discharge status: Home / Self Care | Payer: 59 | Attending: Emergency Medicine | Admitting: Emergency Medicine

## 2020-02-24 DIAGNOSIS — Z79899 Other long term (current) drug therapy: Secondary | ICD-10-CM | POA: Insufficient documentation

## 2020-02-24 DIAGNOSIS — I10 Essential (primary) hypertension: Secondary | ICD-10-CM | POA: Insufficient documentation

## 2020-02-24 DIAGNOSIS — M25511 Pain in right shoulder: Secondary | ICD-10-CM

## 2020-02-24 DIAGNOSIS — E039 Hypothyroidism, unspecified: Secondary | ICD-10-CM | POA: Diagnosis not present

## 2020-02-24 MED ORDER — METHYLPREDNISOLONE 4 MG PO TBPK: ORAL_TABLET | ORAL | 0 refills | Status: DC

## 2020-02-24 MED ORDER — IBUPROFEN 400 MG PO TABS
600.0000 mg | ORAL_TABLET | Freq: Once | ORAL | Status: AC
  Administered 2020-02-24: 600 mg via ORAL
  Filled 2020-02-24: qty 1

## 2020-02-24 MED ORDER — PREDNISONE 50 MG PO TABS
60.0000 mg | ORAL_TABLET | Freq: Once | ORAL | Status: AC
  Administered 2020-02-24: 60 mg via ORAL
  Filled 2020-02-24: qty 1

## 2020-02-24 MED ORDER — OXYCODONE HCL 5 MG PO TABS: 5.0000 mg | ORAL_TABLET | Freq: Four times a day (QID) | ORAL | 0 refills | Status: DC | PRN

## 2020-02-24 MED ORDER — IBUPROFEN 800 MG PO TABS: 800.0000 mg | ORAL_TABLET | Freq: Three times a day (TID) | ORAL | 0 refills | Status: DC

## 2020-02-24 MED ORDER — OXYCODONE-ACETAMINOPHEN 5-325 MG PO TABS
1.0000 | ORAL_TABLET | Freq: Once | ORAL | Status: AC
  Administered 2020-02-24: 1 via ORAL
  Filled 2020-02-24: qty 1

## 2020-02-24 NOTE — ED Triage Notes (Signed)
R shoulder pain since Friday. Pain radiates into elbow. No known injury but does row at the gym regularly.

## 2020-02-24 NOTE — ED Notes (Signed)
ED Provider at bedside. 

## 2020-02-24 NOTE — ED Provider Notes (Signed)
[facility] EMERGENCY DEPARTMENT Provider Note   CSN: [id number] Arrival date & time: 02/24/20  2355     History Chief Complaint  Patient presents with  . Shoulder Pain    Rebecca Mccoy is a 55 y.o. female present emerge department right shoulder pain.  She reports her pain began 2 days ago while she was reaching up to pull herself up in a truck.  She had a soreness in her shoulder.  It is significantly worse with any type of movement, particularly overhead arm raise.  She has never had this kind of pain before, although she does occasionally get shoulder pains when doing her rowing exercise.  She has tried Tylenol and ibuprofen at home with little relief.  She denies numbness or weakness in her hand.  She denies any falls or direct injuries of the shoulder.  She denies any known cardiac history.  She says she exercises regularly and never gets chest pressure, diaphoresis, or lightheadedness with that exercise.    HPI     Past Medical History:  Diagnosis Date  . Anxiety   . Anxiety and depression 02/17/2014  . 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 08/14/2019  . Dyslipidemia 07/03/2018  . Fluid retention   . GERD (gastroesophageal reflux disease)   . GERD (gastroesophageal reflux disease)   . Heart murmur    infant  . High cholesterol   . History of migraine headaches 02/17/2014  . Hx of adenomatous polyp of colon 07/09/2016   Sessile serrated polyp 11/17  . Hypertension   . Hypothyroidism 12/03/2015  . Insomnia 07/30/2016  . Irregular heart beat   . Migraines   . Non-allergic rhinitis 06/10/2017  . OSA (obstructive sleep apnea) 10/04/2017  . PID (pelvic inflammatory disease)   . 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  . Recurrent upper respiratory infection (URI)    April 2019  . Sleep apnea   . Supraventricular tachycardia ([facility]) 08/22/2018  . Thyroid disease   . Urticaria     Patient Active Problem List    Diagnosis Date Noted  . Urticaria   . Thyroid disease   . Sleep apnea   . Recurrent upper respiratory infection (URI)   . PID (pelvic inflammatory disease)   . Migraines   . Irregular heart beat   . Hypertension   . High cholesterol   . Heart murmur   . GERD (gastroesophageal reflux disease)   . Fluid retention   . Anxiety   . 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 08/14/2019  . Supraventricular tachycardia ([facility]) 08/22/2018  . Dyslipidemia 07/03/2018  . OSA (obstructive sleep apnea) 10/04/2017  . 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  . Non-allergic rhinitis 06/10/2017  . Insomnia 07/30/2016  . Hx of adenomatous polyp of colon 07/09/2016  . Hypothyroidism 12/03/2015  . Anxiety and depression 02/17/2014  . History of migraine headaches 02/17/2014    Past Surgical History:  Procedure Laterality Date  . AUGMENTATION MAMMAPLASTY Bilateral 2005  . BREAST SURGERY  2005   Augmentation  . CARPAL TUNNEL RELEASE Bilateral    R in '07 and L '06  . COSMETIC SURGERY    . leg lift    . TONSILLECTOMY  1972  . TUBAL LIGATION  1989  . tummy tuck  2005     OB History    Gravida  2   Para  2  Term  2   Preterm      AB  0   Living  2     SAB      IAB      Ectopic  0   Multiple      Live Births  2           Family History  Problem Relation Age of Onset  . Diabetes Mother   . Uterine cancer Mother   . Colon polyps Mother   . Allergic rhinitis Mother   . Hypertension Mother   . Thyroid disease Mother   . Obesity Mother   . Pancreatic cancer Father   . Hyperlipidemia Father   . Hypertension Father   . Crohn's disease Cousin   . Diabetes Maternal Grandmother   . Stroke Maternal Grandmother   . Diabetes Maternal Grandfather   . Stroke Paternal Grandmother   . Colon cancer Neg Hx   . Stomach cancer Neg Hx   . Esophageal cancer Neg Hx   . Rectal cancer Neg Hx   . Liver cancer Neg Hx   . Eczema  Neg Hx   . Urticaria Neg Hx   . Asthma Neg Hx     Social History   Tobacco Use  . Smoking status: Never Smoker  . Smokeless tobacco: Never Used  Vaping Use  . Vaping Use: Never used  Substance Use Topics  . Alcohol use: Yes    Alcohol/week: 0.0 - 2.0 standard drinks    Comment: 1-2 beer weekly  . Drug use: No    Home Medications Prior to Admission medications   Medication Sig Start Date End Date Taking? Authorizing Provider  ibuprofen (ADVIL) 800 MG tablet Take 1 tablet (800 mg total) by mouth 3 (three) times daily. 02/24/20 03/25/20 Yes Trifan, Carola Rhine, MD  methylPREDNISolone (MEDROL DOSEPAK) 4 MG TBPK tablet Use as directed on package - take with breakfast 02/25/20  Yes Trifan, Carola Rhine, MD  oxyCODONE (ROXICODONE) 5 MG immediate release tablet Take 1 tablet (5 mg total) by mouth every 6 (six) hours as needed for up to 5 doses for severe pain. 02/24/20  Yes Wyvonnia Dusky, MD  albuterol (PROVENTIL HFA;VENTOLIN HFA) 108 (90 Base) MCG/ACT inhaler Inhale 1-2 puffs into the lungs every 6 (six) hours as needed for wheezing or shortness of breath. 05/10/17   Elby Showers, MD  ALPRAZolam Duanne Moron) 1 MG tablet Take 1 tablet (1 mg total) by mouth 2 (two) times daily as needed for anxiety. 12/27/19   Elby Showers, MD  azelastine (ASTELIN) 0.1 % nasal spray Place 1 spray into both nostrils 2 (two) times daily. Use in each nostril as directed 02/21/20   Briscoe Deutscher, DO  B Complex Vitamins (B COMPLEX 100 PO) Take 1 capsule by mouth daily.    [provider]  buPROPion (WELLBUTRIN XL) 150 MG 24 hr tablet TAKE 1 TABLET(150 MG) BY MOUTH DAILY 09/26/19   Salvadore Dom, MD  Ergocalciferol (VITAMIN D2) 50 MCG (2000 UT) TABS Take 1 tablet by mouth daily.    [provider]  glucosamine-chondroitin 500-400 MG tablet Take 1 tablet by mouth daily.    [provider]  Korean Panax Ginseng 100 MG CAPS Take 1 capsule by mouth daily.    [provider]  levothyroxine  (SYNTHROID) 125 MCG tablet Take 1 tablet (125 mcg total) by mouth daily. 12/27/19   Elby Showers, MD  metFORMIN (GLUCOPHAGE) 500 MG tablet Take 1 tablet (500 mg  total) by mouth 2 (two) times daily. 12/25/19 03/24/20  Briscoe Deutscher, DO  Omega-3 Fatty Acids (FISH OIL) 1000 MG CAPS Take 2 capsules by mouth daily.    [provider]  Probiotic Product (PROBIOTIC DAILY PO) Take by mouth.    [provider]  rosuvastatin (CRESTOR) 5 MG tablet TAKE 1 TABLET BY MOUTH THREE TIMES A WEEK 12/25/19   Elby Showers, MD  SYMBICORT 160-4.5 MCG/ACT inhaler INHALE 2 PUFFS INTO THE LUNGS TWICE DAILY 12/26/18   Elby Showers, MD  triamterene-hydrochlorothiazide [facility]) 37.5-25 MG tablet Take 1 tablet by mouth once daily 02/06/20   Elby Showers, MD  TURMERIC PO Take 1 tablet daily by mouth.    [provider]    Allergies    Patient has no known allergies.  Review of Systems   Review of Systems  Constitutional: Negative for chills and fever.  Eyes: Negative for pain and visual disturbance.  Respiratory: Negative for cough and shortness of breath.   Cardiovascular: Negative for chest pain and palpitations.  Gastrointestinal: Negative for abdominal pain and vomiting.  Genitourinary: Negative for dysuria and hematuria.  Musculoskeletal: Positive for arthralgias and myalgias.  Skin: Negative for color change and rash.  Neurological: Negative for syncope, light-headedness and headaches.  All other systems reviewed and are negative.   Physical Exam Updated Vital Signs BP (!) 119/91 (BP Location: Left Arm)   Pulse 92   Temp 98.2 F (36.8 C) (Oral)   Resp 16   Ht 5\' 2"  (1.575 m)   Wt 76.2 kg   LMP  (LMP Unknown)   SpO2 99%   BMI 30.73 kg/m   Physical Exam Constitutional:      General: She is not in acute distress. HENT:     Head: Normocephalic and atraumatic.  Eyes:     Conjunctiva/sclera: Conjunctivae normal.     Pupils: Pupils are equal, round, and reactive to  light.  Cardiovascular:     Rate and Rhythm: Normal rate and regular rhythm.  Pulmonary:     Effort: Pulmonary effort is normal. No respiratory distress.  Musculoskeletal:     Comments: Tenderness of the lateral shoulder No visible deformity of shoulder Pain and limited active and passive extension and overhead arm raise beyond lateral horizontal No ttp or limitations of the elbow with ROM No distal wrist or forearm ttp  Skin:    General: Skin is warm and dry.  Neurological:     General: No focal deficit present.     Mental Status: She is alert. Mental status is at baseline.     Comments: Distal forearm, wrist, and hand nerves intact Negative spurling manuever  Psychiatric:        Mood and Affect: Mood normal.        Behavior: Behavior normal.     ED Results / Procedures / Treatments   Labs (all labs ordered are listed, but only abnormal results are displayed) Labs Reviewed - No data to display  EKG None  Radiology DG Shoulder Right  Result Date: 02/24/2020 CLINICAL DATA:  Right shoulder pain since Friday. Question dislocation. EXAM: RIGHT SHOULDER - 2+ VIEW COMPARISON:  None. FINDINGS: Right shoulder is located. Calcific tendonitis is evident at the rotator cuff insertion. Shoulder is otherwise unremarkable. Right hemithorax is clear. IMPRESSION: 1. 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 2. No acute or focal abnormality.  No dislocation. Electronically Signed   By: San Morelle M.D.   On: 02/24/2020 09:33  Procedures Procedures   Medications Ordered in ED Medications  oxyCODONE-acetaminophen (PERCOCET/ROXICET) 5-325 MG per tablet 1 tablet (1 tablet Oral Given 02/24/20 0906)  ibuprofen (ADVIL) tablet 600 mg (600 mg Oral Given 02/24/20 0905)  predniSONE (DELTASONE) tablet 60 mg (60 mg Oral Given 02/24/20 7893)    ED Course  I have reviewed the triage vital signs and the nursing notes.  Pertinent labs & imaging results that were available during my care  of the patient were reviewed by me and considered in my medical decision making (see chart for details).  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   Cannot exclude the possibility of rotator cuff injury  Xrays ordered and reviewed with no evident dislocation  Very low suspicion for septic joint or infection  We discussed NSAIDS, prednisone, Ice, i'll offer 5 tablets oxycodone for the next day or two to help her sleep.  We offered a sling for comfort while walking although I advised not to wear it all the time, as we want to maintain some mobility of the shoulder.  I advised she follow up with an orthopedist in 1-2 weeks if she is having persistent symptoms.    Final Clinical Impression(s) / ED Diagnoses Final diagnoses:  Acute pain of right shoulder    Rx / DC Orders ED Discharge Orders         Ordered    methylPREDNISolone (MEDROL DOSEPAK) 4 MG TBPK tablet        02/24/20 0940    oxyCODONE (ROXICODONE) 5 MG immediate release tablet  Every 6 hours PRN        02/24/20 0940    ibuprofen (ADVIL) 800 MG tablet  3 times daily        02/24/20 0940           Wyvonnia Dusky, MD 02/24/20 1742

## 2020-02-24 NOTE — ED Notes (Signed)
Per patient's conversation, her right shoulder is a solid 10 if she moves her right shoulder and it is sharp pain.

## 2020-02-25 NOTE — Progress Notes (Signed)
Chief Complaint:   OBESITY Rebecca Mccoy is here to discuss her progress with her obesity treatment plan along with follow-up of her obesity related diagnoses.   Today's visit was #: 13 Starting weight: 183 lbs Starting date: 04/03/2019 Today's weight: 168 lbs Today's date: 02/25/2020 Total lbs lost to date: 02/20/2020 Body mass index is 30.73 kg/m.  Total weight loss percentage to date: -8.20%  Interim History: Rebecca Mccoy is off her beta blocker now.  Her thyroid medication is at goal.  She increased calories for plan.  She reports morning headaches again.  Still having constipation and is taking PureLax as needed. Nutrition Plan: Category 1 for 95% of the time.. Activity: Rowing / Cardio for 30-60 minutes 6 times per week.  Assessment/Plan:   1. Insulin resistance Improving, but not optimized. Goal is HgbA1c < 5.7, fasting insulin closer to 5.  Medication: metformin 500 mg twice daily.  She will continue to focus on protein-rich, low simple carbohydrate foods. We reviewed the importance of hydration, regular exercise for stress reduction, and restorative sleep.   Lab Results  Component Value Date   HGBA1C 5.3 12/24/2019   Lab Results  Component Value Date   INSULIN 10.7 04/03/2019   2. Other specified hypothyroidism Course: Stable. Medication: levothyroxine 125 mcg daily.   Plan: Patient was instructed not to take MVM or iron within 4 hours of taking thyroid medications. We will continue to monitor alongside Endocrinology/PCP as it relates to her weight loss journey.   Lab Results  Component Value Date   TSH 0.66 02/07/2020   3. Mixed hyperlipidemia Course: Controlled. Lipid-lowering medications: Crestor 5 mg daily, Omega-3.   Plan: Dietary changes: Increase soluble fiber. Decrease simple carbohydrates. Exercise changes: An average 40 minutes of moderate to vigorous-intensity aerobic activity 3 or 4 times per week.   Lab Results  Component Value Date   CHOL 190 12/24/2019    HDL 88 12/24/2019   LDLCALC 84 12/24/2019   TRIG 89 12/24/2019   CHOLHDL 2.2 12/24/2019   Lab Results  Component Value Date   ALT 11 12/24/2019   AST 18 12/24/2019   ALKPHOS 82 05/28/2019   BILITOT 0.8 12/24/2019   The 10-year ASCVD risk score [name], et al., 2013) is: 2.4%   Values used to calculate the score:     Age: 55 years     Sex: Female     Is Non-Hispanic African American: No     Diabetic: Yes     Tobacco smoker: No     Systolic Blood Pressure: 829 mmHg     Is BP treated: Yes     HDL Cholesterol: 88 mg/dL     Total Cholesterol: 190 mg/dL  4. 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  - Start azelastine (ASTELIN) 0.1 % nasal spray; Place 1 spray into both nostrils 2 (two) times daily. Use in each nostril as directed  Dispense: 30 mL; Refill: 0  5. At risk for deficient intake of food Rebecca Mccoy was given extensive education and counseling today of more than 10 minutes on risks associated with deficient food intake.  Counseled her on the importance of following our prescribed meal plan and eating adequate amounts of protein.  Discussed with Abel Presto that inadequate food intake over longer periods of time can slow their metabolism down significantly.   6. Class 1 obesity with serious comorbidity and body mass index (BMI) of 30.0 to 30.9 in adult, unspecified obesity type  Course: Rebecca Mccoy is currently in the action stage of change. As such, her goal is to continue with weight loss efforts.   Nutrition goals: She has agreed to the Category 1 Plan.   Exercise goals: For substantial health benefits, adults should do at least 150 minutes (2 hours and 30 minutes) a week of moderate-intensity, or 75 minutes (1 hour and 15 minutes) a week of vigorous-intensity aerobic physical activity, or an equivalent combination of moderate- and vigorous-intensity aerobic activity. Aerobic activity should be performed in episodes of at least 10 minutes, and  preferably, it should be spread throughout the week.  Behavioral modification strategies: increasing lean protein intake, decreasing simple carbohydrates and increasing vegetables.  Rebecca Mccoy has agreed to follow-up with our clinic in 3 weeks. She was informed of the importance of frequent follow-up visits to maximize her success with intensive lifestyle modifications for her multiple health conditions.   Objective:   Blood pressure 108/72, pulse 85, temperature 97.7 F (36.5 C), temperature source Oral, height 5\' 2"  (1.575 m), weight 168 lb (76.2 kg), SpO2 98 %. Body mass index is 30.73 kg/m.  General: Cooperative, alert, well developed, in no acute distress. HEENT: Conjunctivae and lids unremarkable. Cardiovascular: Regular rhythm.  Lungs: Normal work of breathing. Neurologic: No focal deficits.   Lab Results  Component Value Date   CREATININE 1.00 06/22/2019   BUN 15 06/22/2019   NA 140 06/22/2019   K 4.6 06/22/2019   CL 101 06/22/2019   CO2 33 (H) 06/22/2019   Lab Results  Component Value Date   ALT 11 12/24/2019   AST 18 12/24/2019   ALKPHOS 82 05/28/2019   BILITOT 0.8 12/24/2019   Lab Results  Component Value Date   HGBA1C 5.3 12/24/2019   HGBA1C 5.3 06/22/2019   HGBA1C 5.4 01/31/2019   HGBA1C 5.5 08/10/2018   HGBA1C 5.2 07/30/2016   Lab Results  Component Value Date   INSULIN 10.7 04/03/2019   Lab Results  Component Value Date   TSH 0.66 02/07/2020   Lab Results  Component Value Date   CHOL 190 12/24/2019   HDL 88 12/24/2019   LDLCALC 84 12/24/2019   TRIG 89 12/24/2019   CHOLHDL 2.2 12/24/2019   Lab Results  Component Value Date   WBC 5.4 06/22/2019   HGB 12.9 06/22/2019   HCT 39.5 06/22/2019   MCV 93.2 06/22/2019   PLT 293 06/22/2019   Lab Results  Component Value Date   IRON 108 05/28/2019   TIBC 363 05/28/2019   FERRITIN 36 05/28/2019   Attestation Statements:   Reviewed by clinician on day of visit: allergies, medications, problem  list, medical history, surgical history, family history, social history, and previous encounter notes.  I, Water quality scientist, CMA, am acting as transcriptionist for Briscoe Deutscher, DO  I have reviewed the above documentation for accuracy and completeness, and I agree with the above. Briscoe Deutscher, DO

## 2020-02-27 ENCOUNTER — Ambulatory Visit
Admission: RE | Admit: 2020-02-27 | Discharge: 2020-02-27 | Disposition: A | Discharge status: Home / Self Care | Payer: 59 | Source: Ambulatory Visit

## 2020-02-27 ENCOUNTER — Other Ambulatory Visit: Payer: Self-pay

## 2020-02-27 DIAGNOSIS — Z1231 Encounter for screening mammogram for malignant neoplasm of breast: Secondary | ICD-10-CM

## 2020-03-07 ENCOUNTER — Other Ambulatory Visit: Payer: Self-pay | Admitting: Internal Medicine

## 2020-03-12 ENCOUNTER — Telehealth: Payer: Self-pay | Admitting: Family Medicine

## 2020-03-12 NOTE — Telephone Encounter (Signed)
Called pt to offer ED f/u ,she states right now will hold off to see if Rowing activity is cause & will cb later to scheule if it continues.  --glh

## 2020-03-13 ENCOUNTER — Ambulatory Visit (INDEPENDENT_AMBULATORY_CARE_PROVIDER_SITE_OTHER): Payer: 59 | Admitting: Family Medicine

## 2020-03-13 ENCOUNTER — Other Ambulatory Visit: Payer: Self-pay

## 2020-03-13 ENCOUNTER — Encounter (INDEPENDENT_AMBULATORY_CARE_PROVIDER_SITE_OTHER): Payer: Self-pay | Admitting: Family Medicine

## 2020-03-13 VITALS — BP 118/78 | HR 89 | Temp 98.0°F | Ht 62.0 in | Wt 166.0 lb

## 2020-03-13 DIAGNOSIS — M25511 Pain in right shoulder: Secondary | ICD-10-CM | POA: Diagnosis not present

## 2020-03-13 DIAGNOSIS — E038 Other specified hypothyroidism: Secondary | ICD-10-CM

## 2020-03-13 DIAGNOSIS — Z9189 Other specified personal risk factors, not elsewhere classified: Secondary | ICD-10-CM | POA: Diagnosis not present

## 2020-03-13 DIAGNOSIS — E669 Obesity, unspecified: Secondary | ICD-10-CM

## 2020-03-13 DIAGNOSIS — Z6830 Body mass index (BMI) 30.0-30.9, adult: Secondary | ICD-10-CM

## 2020-03-13 DIAGNOSIS — E8881 Metabolic syndrome: Secondary | ICD-10-CM | POA: Diagnosis not present

## 2020-03-13 NOTE — Patient Instructions (Signed)
Shoulder Exercises Ask your health care provider which exercises are safe for you. Do exercises exactly as told by your health care provider and adjust them as directed. It is normal to feel mild stretching, pulling, tightness, or discomfort as you do these exercises. Stop right away if you feel sudden pain or your pain gets worse. Do not begin these exercises until told by your health care provider. Stretching exercises External rotation and abduction This exercise is sometimes called corner stretch. This exercise rotates your arm outward (external rotation) and moves your arm out from your body (abduction). 1. Stand in a doorway with one of your feet slightly in front of the other. This is called a staggered stance. If you cannot reach your forearms to the door frame, stand facing a corner of a room. 2. Choose one of the following positions as told by your health care provider: ? Place your hands and forearms on the door frame above your head. ? Place your hands and forearms on the door frame at the height of your head. ? Place your hands on the door frame at the height of your elbows. 3. Slowly move your weight onto your front foot until you feel a stretch across your chest and in the front of your shoulders. Keep your head and chest upright and keep your abdominal muscles tight. 4. Hold for __________ seconds. 5. To release the stretch, shift your weight to your back foot. Repeat __________ times. Complete this exercise __________ times a day.   Extension, standing 1. Stand and hold a broomstick, a cane, or a similar object behind your back. ? Your hands should be a little wider than shoulder width apart. ? Your palms should face away from your back. 2. Keeping your elbows straight and your shoulder muscles relaxed, move the stick away from your body until you feel a stretch in your shoulders (extension). ? Avoid shrugging your shoulders while you move the stick. Keep your shoulder blades  tucked down toward the middle of your back. 3. Hold for __________ seconds. 4. Slowly return to the starting position. Repeat __________ times. Complete this exercise __________ times a day. Range-of-motion exercises Pendulum 1. Stand near a wall or a surface that you can hold onto for balance. 2. Bend at the waist and let your left / right arm hang straight down. Use your other arm to support you. Keep your back straight and do not lock your knees. 3. Relax your left / right arm and shoulder muscles, and move your hips and your trunk so your left / right arm swings freely. Your arm should swing because of the motion of your body, not because you are using your arm or shoulder muscles. 4. Keep moving your hips and trunk so your arm swings in the following directions, as told by your health care provider: ? Side to side. ? Forward and backward. ? In clockwise and counterclockwise circles. 5. Continue each motion for __________ seconds, or for as long as told by your health care provider. 6. Slowly return to the starting position. Repeat __________ times. Complete this exercise __________ times a day.   Shoulder flexion, standing 1. Stand and hold a broomstick, a cane, or a similar object. Place your hands a little more than shoulder width apart on the object. Your left / right hand should be palm up, and your other hand should be palm down. 2. Keep your elbow straight and your shoulder muscles relaxed. Push the stick up with your healthy   arm to raise your left / right arm in front of your body, and then over your head until you feel a stretch in your shoulder (flexion). ? Avoid shrugging your shoulder while you raise your arm. Keep your shoulder blade tucked down toward the middle of your back. 3. Hold for __________ seconds. 4. Slowly return to the starting position. Repeat __________ times. Complete this exercise __________ times a day.   Shoulder abduction, standing 1. Stand and hold a  broomstick, a cane, or a similar object. Place your hands a little more than shoulder width apart on the object. Your left / right hand should be palm up, and your other hand should be palm down. 2. Keep your elbow straight and your shoulder muscles relaxed. Push the object across your body toward your left / right side. Raise your left / right arm to the side of your body (abduction) until you feel a stretch in your shoulder. ? Do not raise your arm above shoulder height unless your health care provider tells you to do that. ? If directed, raise your arm over your head. ? Avoid shrugging your shoulder while you raise your arm. Keep your shoulder blade tucked down toward the middle of your back. 3. Hold for __________ seconds. 4. Slowly return to the starting position. Repeat __________ times. Complete this exercise __________ times a day. Internal rotation 1. Place your left / right hand behind your back, palm up. 2. Use your other hand to dangle an exercise band, a towel, or a similar object over your shoulder. Grasp the band with your left / right hand so you are holding on to both ends. 3. Gently pull up on the band until you feel a stretch in the front of your left / right shoulder. The movement of your arm toward the center of your body is called internal rotation. ? Avoid shrugging your shoulder while you raise your arm. Keep your shoulder blade tucked down toward the middle of your back. 4. Hold for __________ seconds. 5. Release the stretch by letting go of the band and lowering your hands. Repeat __________ times. Complete this exercise __________ times a day.   Strengthening exercises External rotation 1. Sit in a stable chair without armrests. 2. Secure an exercise band to a stable object at elbow height on your left / right side. 3. Place a soft object, such as a folded towel or a small pillow, between your left / right upper arm and your body to move your elbow about 4 inches (10  cm) away from your side. 4. Hold the end of the exercise band so it is tight and there is no slack. 5. Keeping your elbow pressed against the soft object, slowly move your forearm out, away from your abdomen (external rotation). Keep your body steady so only your forearm moves. 6. Hold for __________ seconds. 7. Slowly return to the starting position. Repeat __________ times. Complete this exercise __________ times a day.   Shoulder abduction 1. Sit in a stable chair without armrests, or stand up. 2. Hold a __________ weight in your left / right hand, or hold an exercise band with both hands. 3. Start with your arms straight down and your left / right palm facing in, toward your body. 4. Slowly lift your left / right hand out to your side (abduction). Do not lift your hand above shoulder height unless your health care provider tells you that this is safe. ? Keep your arms straight. ? Avoid   shrugging your shoulder while you do this movement. Keep your shoulder blade tucked down toward the middle of your back. 5. Hold for __________ seconds. 6. Slowly lower your arm, and return to the starting position. Repeat __________ times. Complete this exercise __________ times a day.   Shoulder extension 1. Sit in a stable chair without armrests, or stand up. 2. Secure an exercise band to a stable object in front of you so it is at shoulder height. 3. Hold one end of the exercise band in each hand. Your palms should face each other. 4. Straighten your elbows and lift your hands up to shoulder height. 5. Step back, away from the secured end of the exercise band, until the band is tight and there is no slack. 6. Squeeze your shoulder blades together as you pull your hands down to the sides of your thighs (extension). Stop when your hands are straight down by your sides. Do not let your hands go behind your body. 7. Hold for __________ seconds. 8. Slowly return to the starting position. Repeat __________  times. Complete this exercise __________ times a day. Shoulder row 1. Sit in a stable chair without armrests, or stand up. 2. Secure an exercise band to a stable object in front of you so it is at waist height. 3. Hold one end of the exercise band in each hand. Position your palms so that your thumbs are facing the ceiling (neutral position). 4. Bend each of your elbows to a 90-degree angle (right angle) and keep your upper arms at your sides. 5. Step back until the band is tight and there is no slack. 6. Slowly pull your elbows back behind you. 7. Hold for __________ seconds. 8. Slowly return to the starting position. Repeat __________ times. Complete this exercise __________ times a day. Shoulder press-ups 1. Sit in a stable chair that has armrests. Sit upright, with your feet flat on the floor. 2. Put your hands on the armrests so your elbows are bent and your fingers are pointing forward. Your hands should be about even with the sides of your body. 3. Push down on the armrests and use your arms to lift yourself off the chair. Straighten your elbows and lift yourself up as much as you comfortably can. ? Move your shoulder blades down, and avoid letting your shoulders move up toward your ears. ? Keep your feet on the ground. As you get stronger, your feet should support less of your body weight as you lift yourself up. 4. Hold for __________ seconds. 5. Slowly lower yourself back into the chair. Repeat __________ times. Complete this exercise __________ times a day.   Wall push-ups 1. Stand so you are facing a stable wall. Your feet should be about one arm-length away from the wall. 2. Lean forward and place your palms on the wall at shoulder height. 3. Keep your feet flat on the floor as you bend your elbows and lean forward toward the wall. 4. Hold for __________ seconds. 5. Straighten your elbows to push yourself back to the starting position. Repeat __________ times. Complete this  exercise __________ times a day.   This information is not intended to replace advice given to you by your health care provider. Make sure you discuss any questions you have with your health care provider. Document Revised: 04/28/2018 Document Reviewed: 02/03/2018 [employer] Patient Education  2021 [employer].  Shoulder Exercises Ask your health care provider which exercises are safe for you. Do exercises exactly as  told by your health care provider and adjust them as directed. It is normal to feel mild stretching, pulling, tightness, or discomfort as you do these exercises. Stop right away if you feel sudden pain or your pain gets worse. Do not begin these exercises until told by your health care provider. Stretching exercises External rotation and abduction This exercise is sometimes called corner stretch. This exercise rotates your arm outward (external rotation) and moves your arm out from your body (abduction). 6. Stand in a doorway with one of your feet slightly in front of the other. This is called a staggered stance. If you cannot reach your forearms to the door frame, stand facing a corner of a room. 7. Choose one of the following positions as told by your health care provider: ? Place your hands and forearms on the door frame above your head. ? Place your hands and forearms on the door frame at the height of your head. ? Place your hands on the door frame at the height of your elbows. 8. Slowly move your weight onto your front foot until you feel a stretch across your chest and in the front of your shoulders. Keep your head and chest upright and keep your abdominal muscles tight. 9. Hold for __________ seconds. 10. To release the stretch, shift your weight to your back foot. Repeat __________ times. Complete this exercise __________ times a day.   Extension, standing 5. Stand and hold a broomstick, a cane, or a similar object behind your back. ? Your hands should be a little wider  than shoulder width apart. ? Your palms should face away from your back. 6. Keeping your elbows straight and your shoulder muscles relaxed, move the stick away from your body until you feel a stretch in your shoulders (extension). ? Avoid shrugging your shoulders while you move the stick. Keep your shoulder blades tucked down toward the middle of your back. 7. Hold for __________ seconds. 8. Slowly return to the starting position. Repeat __________ times. Complete this exercise __________ times a day. Range-of-motion exercises Pendulum 7. Stand near a wall or a surface that you can hold onto for balance. 8. Bend at the waist and let your left / right arm hang straight down. Use your other arm to support you. Keep your back straight and do not lock your knees. 9. Relax your left / right arm and shoulder muscles, and move your hips and your trunk so your left / right arm swings freely. Your arm should swing because of the motion of your body, not because you are using your arm or shoulder muscles. 10. Keep moving your hips and trunk so your arm swings in the following directions, as told by your health care provider: ? Side to side. ? Forward and backward. ? In clockwise and counterclockwise circles. 11. Continue each motion for __________ seconds, or for as long as told by your health care provider. 12. Slowly return to the starting position. Repeat __________ times. Complete this exercise __________ times a day.   Shoulder flexion, standing 5. Stand and hold a broomstick, a cane, or a similar object. Place your hands a little more than shoulder width apart on the object. Your left / right hand should be palm up, and your other hand should be palm down. 6. Keep your elbow straight and your shoulder muscles relaxed. Push the stick up with your healthy arm to raise your left / right arm in front of your body, and then over your  head until you feel a stretch in your shoulder (flexion). ? Avoid  shrugging your shoulder while you raise your arm. Keep your shoulder blade tucked down toward the middle of your back. 7. Hold for __________ seconds. 8. Slowly return to the starting position. Repeat __________ times. Complete this exercise __________ times a day.   Shoulder abduction, standing 5. Stand and hold a broomstick, a cane, or a similar object. Place your hands a little more than shoulder width apart on the object. Your left / right hand should be palm up, and your other hand should be palm down. 6. Keep your elbow straight and your shoulder muscles relaxed. Push the object across your body toward your left / right side. Raise your left / right arm to the side of your body (abduction) until you feel a stretch in your shoulder. ? Do not raise your arm above shoulder height unless your health care provider tells you to do that. ? If directed, raise your arm over your head. ? Avoid shrugging your shoulder while you raise your arm. Keep your shoulder blade tucked down toward the middle of your back. 7. Hold for __________ seconds. 8. Slowly return to the starting position. Repeat __________ times. Complete this exercise __________ times a day. Internal rotation 6. Place your left / right hand behind your back, palm up. 7. Use your other hand to dangle an exercise band, a towel, or a similar object over your shoulder. Grasp the band with your left / right hand so you are holding on to both ends. 8. Gently pull up on the band until you feel a stretch in the front of your left / right shoulder. The movement of your arm toward the center of your body is called internal rotation. ? Avoid shrugging your shoulder while you raise your arm. Keep your shoulder blade tucked down toward the middle of your back. 9. Hold for __________ seconds. 10. Release the stretch by letting go of the band and lowering your hands. Repeat __________ times. Complete this exercise __________ times a day.    Strengthening exercises External rotation 8. Sit in a stable chair without armrests. 9. Secure an exercise band to a stable object at elbow height on your left / right side. 10. Place a soft object, such as a folded towel or a small pillow, between your left / right upper arm and your body to move your elbow about 4 inches (10 cm) away from your side. 11. Hold the end of the exercise band so it is tight and there is no slack. 12. Keeping your elbow pressed against the soft object, slowly move your forearm out, away from your abdomen (external rotation). Keep your body steady so only your forearm moves. 13. Hold for __________ seconds. 14. Slowly return to the starting position. Repeat __________ times. Complete this exercise __________ times a day.   Shoulder abduction 7. Sit in a stable chair without armrests, or stand up. 8. Hold a __________ weight in your left / right hand, or hold an exercise band with both hands. 9. Start with your arms straight down and your left / right palm facing in, toward your body. 10. Slowly lift your left / right hand out to your side (abduction). Do not lift your hand above shoulder height unless your health care provider tells you that this is safe. ? Keep your arms straight. ? Avoid shrugging your shoulder while you do this movement. Keep your shoulder blade tucked down toward the middle  of your back. 11. Hold for __________ seconds. 12. Slowly lower your arm, and return to the starting position. Repeat __________ times. Complete this exercise __________ times a day.   Shoulder extension 9. Sit in a stable chair without armrests, or stand up. 10. Secure an exercise band to a stable object in front of you so it is at shoulder height. 11. Hold one end of the exercise band in each hand. Your palms should face each other. 12. Straighten your elbows and lift your hands up to shoulder height. 13. Step back, away from the secured end of the exercise band, until  the band is tight and there is no slack. 14. Squeeze your shoulder blades together as you pull your hands down to the sides of your thighs (extension). Stop when your hands are straight down by your sides. Do not let your hands go behind your body. 15. Hold for __________ seconds. 16. Slowly return to the starting position. Repeat __________ times. Complete this exercise __________ times a day. Shoulder row 9. Sit in a stable chair without armrests, or stand up. 10. Secure an exercise band to a stable object in front of you so it is at waist height. 11. Hold one end of the exercise band in each hand. Position your palms so that your thumbs are facing the ceiling (neutral position). 12. Bend each of your elbows to a 90-degree angle (right angle) and keep your upper arms at your sides. 13. Step back until the band is tight and there is no slack. 14. Slowly pull your elbows back behind you. 15. Hold for __________ seconds. 16. Slowly return to the starting position. Repeat __________ times. Complete this exercise __________ times a day. Shoulder press-ups 6. Sit in a stable chair that has armrests. Sit upright, with your feet flat on the floor. 7. Put your hands on the armrests so your elbows are bent and your fingers are pointing forward. Your hands should be about even with the sides of your body. 8. Push down on the armrests and use your arms to lift yourself off the chair. Straighten your elbows and lift yourself up as much as you comfortably can. ? Move your shoulder blades down, and avoid letting your shoulders move up toward your ears. ? Keep your feet on the ground. As you get stronger, your feet should support less of your body weight as you lift yourself up. 9. Hold for __________ seconds. 10. Slowly lower yourself back into the chair. Repeat __________ times. Complete this exercise __________ times a day.   Wall push-ups 6. Stand so you are facing a stable wall. Your feet should be  about one arm-length away from the wall. 7. Lean forward and place your palms on the wall at shoulder height. 8. Keep your feet flat on the floor as you bend your elbows and lean forward toward the wall. 9. Hold for __________ seconds. 10. Straighten your elbows to push yourself back to the starting position. Repeat __________ times. Complete this exercise __________ times a day.   This information is not intended to replace advice given to you by your health care provider. Make sure you discuss any questions you have with your health care provider. Document Revised: 04/28/2018 Document Reviewed: 02/03/2018 [employer] Patient Education  2021 [employer].

## 2020-03-18 NOTE — Progress Notes (Signed)
Chief Complaint:   OBESITY Rebecca Mccoy is here to discuss her progress with her obesity treatment plan along with follow-up of her obesity related diagnoses.   Today's visit was #: 14 Starting weight: 183 lbs Starting date: 04/03/2019 Today's weight: 166 lbs Today's date: 03/13/2020 Total lbs lost to date: 17 lbs Body mass index is 30.36 kg/m.  Total weight loss percentage to date: -9.29%  Interim History:  Rebecca Mccoy says she has right shoulder pain.  Reviewed ED visit. Current Meal Plan: the Category 1 Plan for 90% of the time.  Current Exercise Plan: Cardio/strength training for 30 minutes 3-5 times per week.  Assessment/Plan:   1. Other specified hypothyroidism Course: Stable. Medication: Euthyrox 125 mcg daily.   Plan: Patient was instructed not to take MVM or iron within 4 hours of taking thyroid medications. This issue is managed by Dr. Renold Genta. We will continue to monitor alongside Endocrinology/PCP as it relates to her weight loss journey.   Lab Results  Component Value Date   TSH 0.66 02/07/2020   2. Right shoulder pain, acute Consistent with strain/tendonitis. Reviewed symptomatic care and red flags. Exercises and stretches reviewed. To Sports Medicine if not improving. Will follow because mobility and pain control are important for weight management.  3. Metabolic syndrome Starting goal: Lose 7-10% of starting weight. She will continue to focus on protein-rich, low simple carbohydrate foods. We reviewed the importance of hydration, regular exercise for stress reduction, and restorative sleep.  We will continue to check lab work every 3 months, with 10% weight loss, or should any other concerns arise.  4. At risk for activity intolerance Rebecca Mccoy was given approximately 8 minutes of counseling today regarding her increased risk for exercise intolerance.  We discussed patient's specific personal and medical issues that raise our concern.  She was advised of strategies to prevent  injury and ways to improve her cardiopulmonary fitness levels slowly over time.  We additionally discussed various fitness trackers and smart phone apps to help motivate the patient to stay on track.   5. Class 1 obesity with serious comorbidity and body mass index (BMI) of 30.0 to 30.9 in adult, unspecified obesity type  Course: Rebecca Mccoy is currently in the action stage of change. As such, her goal is to continue with weight loss efforts.   Nutrition goals: She has agreed to the Category 1 Plan.   Exercise goals: For substantial health benefits, adults should do at least 150 minutes (2 hours and 30 minutes) a week of moderate-intensity, or 75 minutes (1 hour and 15 minutes) a week of vigorous-intensity aerobic physical activity, or an equivalent combination of moderate- and vigorous-intensity aerobic activity. Aerobic activity should be performed in episodes of at least 10 minutes, and preferably, it should be spread throughout the week.  Shoulder PT reviewed.  Behavioral modification strategies: increasing lean protein intake, decreasing simple carbohydrates, increasing vegetables and increasing water intake.  Rebecca Mccoy has agreed to follow-up with our clinic in 3-4 weeks. She was informed of the importance of frequent follow-up visits to maximize her success with intensive lifestyle modifications for her multiple health conditions.   Objective:   Blood pressure 118/78, pulse 89, temperature 98 F (36.7 C), temperature source Oral, height 5\' 2"  (1.575 m), weight 166 lb (75.3 kg), SpO2 98 %. Body mass index is 30.36 kg/m.  General: Cooperative, alert, well developed, in no acute distress. HEENT: Conjunctivae and lids unremarkable. Cardiovascular: Regular rhythm.  Lungs: Normal work of breathing. Neurologic: No focal deficits.  Lab Results  Component Value Date   CREATININE 1.00 06/22/2019   BUN 15 06/22/2019   NA 140 06/22/2019   K 4.6 06/22/2019   CL 101 06/22/2019   CO2 33 (H)  06/22/2019   Lab Results  Component Value Date   ALT 11 12/24/2019   AST 18 12/24/2019   ALKPHOS 82 05/28/2019   BILITOT 0.8 12/24/2019   Lab Results  Component Value Date   HGBA1C 5.3 12/24/2019   HGBA1C 5.3 06/22/2019   HGBA1C 5.4 01/31/2019   HGBA1C 5.5 08/10/2018   HGBA1C 5.2 07/30/2016   Lab Results  Component Value Date   INSULIN 10.7 04/03/2019   Lab Results  Component Value Date   TSH 0.66 02/07/2020   Lab Results  Component Value Date   CHOL 190 12/24/2019   HDL 88 12/24/2019   LDLCALC 84 12/24/2019   TRIG 89 12/24/2019   CHOLHDL 2.2 12/24/2019   Lab Results  Component Value Date   WBC 5.4 06/22/2019   HGB 12.9 06/22/2019   HCT 39.5 06/22/2019   MCV 93.2 06/22/2019   PLT 293 06/22/2019   Lab Results  Component Value Date   IRON 108 05/28/2019   TIBC 363 05/28/2019   FERRITIN 36 05/28/2019   Attestation Statements:   Reviewed by clinician on day of visit: allergies, medications, problem list, medical history, surgical history, family history, social history, and previous encounter notes.  I, Water quality scientist, CMA, am acting as transcriptionist for Briscoe Deutscher, DO  I have reviewed the above documentation for accuracy and completeness, and I agree with the above. Briscoe Deutscher, DO

## 2020-04-07 ENCOUNTER — Encounter (INDEPENDENT_AMBULATORY_CARE_PROVIDER_SITE_OTHER): Payer: Self-pay

## 2020-04-07 ENCOUNTER — Other Ambulatory Visit (INDEPENDENT_AMBULATORY_CARE_PROVIDER_SITE_OTHER): Payer: Self-pay | Admitting: Family Medicine

## 2020-04-07 ENCOUNTER — Other Ambulatory Visit: Payer: Self-pay | Admitting: Internal Medicine

## 2020-04-07 DIAGNOSIS — E8881 Metabolic syndrome: Secondary | ICD-10-CM

## 2020-04-07 NOTE — Telephone Encounter (Signed)
Message sent to pt.

## 2020-04-07 NOTE — Telephone Encounter (Signed)
Last OV with Dr Wallace 

## 2020-04-14 ENCOUNTER — Ambulatory Visit (INDEPENDENT_AMBULATORY_CARE_PROVIDER_SITE_OTHER): Payer: 59 | Admitting: Family Medicine

## 2020-04-14 ENCOUNTER — Encounter (INDEPENDENT_AMBULATORY_CARE_PROVIDER_SITE_OTHER): Payer: Self-pay | Admitting: Family Medicine

## 2020-04-14 ENCOUNTER — Other Ambulatory Visit: Payer: Self-pay

## 2020-04-14 VITALS — BP 104/72 | HR 85 | Temp 98.3°F | Ht 62.0 in | Wt 164.0 lb

## 2020-04-14 DIAGNOSIS — R632 Polyphagia: Secondary | ICD-10-CM | POA: Diagnosis not present

## 2020-04-14 DIAGNOSIS — Z9189 Other specified personal risk factors, not elsewhere classified: Secondary | ICD-10-CM | POA: Diagnosis not present

## 2020-04-14 DIAGNOSIS — Z6833 Body mass index (BMI) 33.0-33.9, adult: Secondary | ICD-10-CM

## 2020-04-14 DIAGNOSIS — E669 Obesity, unspecified: Secondary | ICD-10-CM

## 2020-04-14 DIAGNOSIS — E8881 Metabolic syndrome: Secondary | ICD-10-CM | POA: Diagnosis not present

## 2020-04-14 DIAGNOSIS — E038 Other specified hypothyroidism: Secondary | ICD-10-CM

## 2020-04-14 MED ORDER — PHENTERMINE HCL 8 MG PO TABS: 8.0000 mg | ORAL_TABLET | Freq: Every day | ORAL | 0 refills | Status: DC

## 2020-04-17 ENCOUNTER — Other Ambulatory Visit: Payer: Self-pay | Admitting: Internal Medicine

## 2020-04-23 NOTE — Progress Notes (Signed)
Chief Complaint:   OBESITY Rebecca Mccoy is here to discuss her progress with her obesity treatment plan along with follow-up of her obesity related diagnoses.   Today's visit was #: 15 Starting weight: 183 lbs Starting date: 04/03/2019 Today's weight: 164 lbs Today's date: 04/14/2020 Total lbs lost to date: 19 lbs Body mass index is 30 kg/m.  Total weight loss percentage to date: -10.38%  Interim History:  Rebecca Mccoy says she cannot tolerate Saxenda due to nausea/vomiting.  She has history of SVT x1 and was previously on metoprolol.  Cardiology told her it was okay to stop taking it.  She has had no issues since then.  Her medication for hypothyroidism was increased.   Current Meal Plan: the Category 1 Plan for 95% of the time.  Current Exercise Plan: None.  Assessment/Plan:   1. Polyphagia Not at goal.  Worse at night.  Current treatment: None.  She did not tolerate GLP-1 RA.   Polyphagia refers to excessive feelings of hunger. She will continue to focus on protein-rich, low simple carbohydrate foods. We reviewed the importance of hydration, regular exercise for stress reduction, and restorative sleep.    Plan:  Will start phentermine 8 mg, as per below.  Will consider topiramate.  - Start Phentermine HCl 8 MG TABS; Take 8-16 mg by mouth daily.  Dispense: 60 tablet; Refill: 0  We reviewed potential side effects including insomnia, dry mouth, increased heart rate and blood pressure, and increased anxiety. We reviewed reducing caffeine consumption while taking phentermine, especially if the patient is experiencing side effects. Alternative treatment options were discussed. All questions were answered, and the patient wishes to move forward with this medication.  I have consulted the [place] Controlled Substances Registry for this patient, and feel the risk/benefit ratio today is favorable for proceeding with this prescription for a controlled substance. The patient understands monitoring  parameters and red flags.   2. Other specified hypothyroidism Medication: Euthyrox 125 mcg daily.   Plan: Patient was instructed not to take MVM or iron within 4 hours of taking thyroid medications. This issue is managed by Dr. Renold Genta. We will continue to monitor alongside Endocrinology/PCP as it relates to her weight loss journey.   Lab Results  Component Value Date   TSH 0.66 02/07/2020   3. Metabolic syndrome Starting goal: Lose 7-10% of starting weight. She will continue to focus on protein-rich, low simple carbohydrate foods. We reviewed the importance of hydration, regular exercise for stress reduction, and restorative sleep.  We will continue to check lab work every 3 months, with 10% weight loss, or should any other concerns arise.  4. At risk for side effect of medication Due to Rebecca Mccoy's current conditions and medications, she is at a higher risk for drug side effect.  At least 8 minutes was spent on counseling her about these concerns today.  We discussed the benefits and potential risks of these medications, and all of patient's concerns were addressed and questions were answered.  she will call us, or their PCP or other specialists who treat their conditions with medications, with any questions or concerns that may develop.    5. Obesity, current BMI 30  Course: Rebecca Mccoy is currently in the action stage of change. As such, her goal is to continue with weight loss efforts.   Nutrition goals: She has agreed to the Category 1 Plan.   Exercise goals: Increase activity.  Behavioral modification strategies: increasing lean protein intake, decreasing simple carbohydrates, increasing vegetables, increasing water  intake and decreasing liquid calories.  Rebecca Mccoy has agreed to follow-up with our clinic in 4 weeks. She was informed of the importance of frequent follow-up visits to maximize her success with intensive lifestyle modifications for her multiple health conditions.   Objective:    Blood pressure 104/72, pulse 85, temperature 98.3 F (36.8 C), temperature source Oral, height 5\' 2"  (1.575 m), weight 164 lb (74.4 kg), SpO2 100 %. Body mass index is 30 kg/m.  General: Cooperative, alert, well developed, in no acute distress. HEENT: Conjunctivae and lids unremarkable. Cardiovascular: Regular rhythm.  Lungs: Normal work of breathing. Neurologic: No focal deficits.   Lab Results  Component Value Date   CREATININE 1.00 06/22/2019   BUN 15 06/22/2019   NA 140 06/22/2019   K 4.6 06/22/2019   CL 101 06/22/2019   CO2 33 (H) 06/22/2019   Lab Results  Component Value Date   ALT 11 12/24/2019   AST 18 12/24/2019   ALKPHOS 82 05/28/2019   BILITOT 0.8 12/24/2019   Lab Results  Component Value Date   HGBA1C 5.3 12/24/2019   HGBA1C 5.3 06/22/2019   HGBA1C 5.4 01/31/2019   HGBA1C 5.5 08/10/2018   HGBA1C 5.2 07/30/2016   Lab Results  Component Value Date   INSULIN 10.7 04/03/2019   Lab Results  Component Value Date   TSH 0.66 02/07/2020   Lab Results  Component Value Date   CHOL 190 12/24/2019   HDL 88 12/24/2019   LDLCALC 84 12/24/2019   TRIG 89 12/24/2019   CHOLHDL 2.2 12/24/2019   Lab Results  Component Value Date   WBC 5.4 06/22/2019   HGB 12.9 06/22/2019   HCT 39.5 06/22/2019   MCV 93.2 06/22/2019   PLT 293 06/22/2019   Lab Results  Component Value Date   IRON 108 05/28/2019   TIBC 363 05/28/2019   FERRITIN 36 05/28/2019   Attestation Statements:   Reviewed by clinician on day of visit: allergies, medications, problem list, medical history, surgical history, family history, social history, and previous encounter notes.  I, Water quality scientist, CMA, am acting as transcriptionist for Briscoe Deutscher, DO  I have reviewed the above documentation for accuracy and completeness, and I agree with the above. Briscoe Deutscher, DO

## 2020-05-12 ENCOUNTER — Other Ambulatory Visit (INDEPENDENT_AMBULATORY_CARE_PROVIDER_SITE_OTHER): Payer: Self-pay | Admitting: Family Medicine

## 2020-05-12 DIAGNOSIS — R632 Polyphagia: Secondary | ICD-10-CM

## 2020-05-13 ENCOUNTER — Other Ambulatory Visit: Payer: Self-pay | Admitting: Internal Medicine

## 2020-05-13 ENCOUNTER — Other Ambulatory Visit (INDEPENDENT_AMBULATORY_CARE_PROVIDER_SITE_OTHER): Payer: Self-pay | Admitting: Family Medicine

## 2020-05-13 ENCOUNTER — Encounter (INDEPENDENT_AMBULATORY_CARE_PROVIDER_SITE_OTHER): Payer: Self-pay

## 2020-05-13 DIAGNOSIS — E8881 Metabolic syndrome: Secondary | ICD-10-CM

## 2020-05-13 NOTE — Telephone Encounter (Signed)
Dr.Wallace °

## 2020-05-13 NOTE — Telephone Encounter (Signed)
Message sent to pt.

## 2020-05-28 ENCOUNTER — Encounter (INDEPENDENT_AMBULATORY_CARE_PROVIDER_SITE_OTHER): Payer: Self-pay | Admitting: Family Medicine

## 2020-05-28 ENCOUNTER — Other Ambulatory Visit: Payer: Self-pay

## 2020-05-28 ENCOUNTER — Ambulatory Visit (INDEPENDENT_AMBULATORY_CARE_PROVIDER_SITE_OTHER): Payer: 59 | Admitting: Family Medicine

## 2020-05-28 ENCOUNTER — Other Ambulatory Visit (INDEPENDENT_AMBULATORY_CARE_PROVIDER_SITE_OTHER): Payer: Self-pay | Admitting: Family Medicine

## 2020-05-28 VITALS — BP 114/78 | HR 90 | Temp 98.1°F | Ht 62.0 in | Wt 163.0 lb

## 2020-05-28 DIAGNOSIS — Z780 Asymptomatic menopausal state: Secondary | ICD-10-CM

## 2020-05-28 DIAGNOSIS — E669 Obesity, unspecified: Secondary | ICD-10-CM

## 2020-05-28 DIAGNOSIS — E039 Hypothyroidism, unspecified: Secondary | ICD-10-CM | POA: Diagnosis not present

## 2020-05-28 DIAGNOSIS — R632 Polyphagia: Secondary | ICD-10-CM | POA: Diagnosis not present

## 2020-05-28 DIAGNOSIS — Z6833 Body mass index (BMI) 33.0-33.9, adult: Secondary | ICD-10-CM

## 2020-05-28 DIAGNOSIS — E8881 Metabolic syndrome: Secondary | ICD-10-CM

## 2020-05-28 DIAGNOSIS — Z9189 Other specified personal risk factors, not elsewhere classified: Secondary | ICD-10-CM | POA: Diagnosis not present

## 2020-05-28 MED ORDER — PHENTERMINE HCL 15 MG PO CAPS: 15.0000 mg | ORAL_CAPSULE | ORAL | 1 refills | Status: DC

## 2020-05-28 MED ORDER — METFORMIN HCL 500 MG PO TABS: 1.0000 | ORAL_TABLET | Freq: Two times a day (BID) | ORAL | 0 refills | Status: DC

## 2020-05-28 NOTE — Telephone Encounter (Signed)
Pt last seen by Dr. Wallace.  

## 2020-06-04 NOTE — Progress Notes (Signed)
Chief Complaint:   OBESITY Rebecca Mccoy is here to discuss her progress with her obesity treatment plan along with follow-up of her obesity related diagnoses.   Today's visit was #: 75 Starting weight: 183 lbs Starting date: 04/03/2019 Today's weight: 163 lbs Today's date: 05/28/2020 Weight change since last visit: 1 lb Total lbs lost to date: 20 lbs Body mass index is 29.81 kg/m.  Total weight loss percentage to date: -10.93%  Interim History:  Rebecca Mccoy says she has had less focus in the past 6-12 months.  More in the past 6 months and less often with taking phentermine. Current Meal Plan: the Category 1 Plan for 90-95% of the time.  Current Exercise Plan: Walking for 30 minutes 5 times per week. Current Anti-Obesity Medications: phentermine 8-16 mg daily. Side effects: None.  Assessment/Plan:   Meds ordered this encounter  Medications  . metFORMIN (GLUCOPHAGE) 500 MG tablet    Sig: Take 1 tablet (500 mg total) by mouth 2 (two) times daily.    Dispense:  60 tablet    Refill:  0  . phentermine 15 MG capsule    Sig: Take 1 capsule (15 mg total) by mouth every morning.    Dispense:  30 capsule    Refill:  1    1. Insulin resistance Not at goal. Goal is HgbA1c < 5.7, fasting insulin closer to 5.  Medication: metformin 500 mg twice daily .    Plan:  Continue metformin.  Will refill today.  She will continue to focus on protein-rich, low simple carbohydrate foods. We reviewed the importance of hydration, regular exercise for stress reduction, and restorative sleep.   Lab Results  Component Value Date   HGBA1C 5.3 12/24/2019   Lab Results  Component Value Date   INSULIN 10.7 04/03/2019   - Refill metFORMIN (GLUCOPHAGE) 500 MG tablet; Take 1 tablet (500 mg total) by mouth 2 (two) times daily.  Dispense: 60 tablet; Refill: 0  2. Polyphagia Current treatment: phentermine 8-16 mg daily. Polyphagia refers to excessive feelings of hunger. She will continue to focus on protein-rich,  low simple carbohydrate foods. We reviewed the importance of hydration, regular exercise for stress reduction, and restorative sleep.  Plan:  Increase phentermine to 15 mg daily.  New prescription sent to pharmacy today.  - Increase phentermine 15 MG capsule; Take 1 capsule (15 mg total) by mouth every morning.  Dispense: 30 capsule; Refill: 1  Having again reminded the patient of the "off label" use of Phentermine beyond three consecutive months, and again discussing the risks, benefits, contraindications, and limitations of it's use; given it's role in the successful treatment of obesity thus far and lack of adverse effect, patient has expressed desire and given informed verbal consent to continue use.   I have consulted the [place] Controlled Substances Registry for this patient, and feel the risk/benefit ratio today is favorable for proceeding with this prescription for a controlled substance. The patient understands monitoring parameters and red flags.   3. Acquired hypothyroidism Medication: Euthyrox 125 mcg daily.   Plan: Patient was instructed not to take MVM or iron within 4 hours of taking thyroid medications. This issue is managed by her PCP. We will continue to monitor alongside Endocrinology/PCP as it relates to her weight loss journey.   Lab Results  Component Value Date   TSH 0.66 02/07/2020   4. Menopause Women with obesity may experience more severe vasomotor symptoms at menopause than women with overweight or normal weight. We will  continue to monitor symptoms as they relate to her weight loss journey.  5. At risk for impaired metabolic function Due to Rebecca Mccoy's current state of health and medical condition(s), she is at a significantly higher risk for impaired metabolic function.   At least 8 minutes was spent on counseling Mata about these concerns today.  This places the patient at a much greater risk to subsequently develop cardio-pulmonary conditions that can negatively affect  the patient's quality of life.  I stressed the importance of reversing these risks factors.   6. Obesity, current BMI 29.9  Course: Rebecca Mccoy is currently in the action stage of change. As such, her goal is to continue with weight loss efforts.   Nutrition goals: She has agreed to the Category 1 Plan.   Exercise goals: For substantial health benefits, adults should do at least 150 minutes (2 hours and 30 minutes) a week of moderate-intensity, or 75 minutes (1 hour and 15 minutes) a week of vigorous-intensity aerobic physical activity, or an equivalent combination of moderate- and vigorous-intensity aerobic activity. Aerobic activity should be performed in episodes of at least 10 minutes, and preferably, it should be spread throughout the week.  Behavioral modification strategies: increasing lean protein intake, decreasing simple carbohydrates and increasing vegetables.  Kira has agreed to follow-up with our clinic in 4 weeks. She was informed of the importance of frequent follow-up visits to maximize her success with intensive lifestyle modifications for her multiple health conditions.   Objective:   Blood pressure 114/78, pulse 90, temperature 98.1 F (36.7 C), temperature source Oral, height 5\' 2"  (1.575 m), weight 163 lb (73.9 kg), SpO2 99 %. Body mass index is 29.81 kg/m.  General: Cooperative, alert, well developed, in no acute distress. HEENT: Conjunctivae and lids unremarkable. Cardiovascular: Regular rhythm.  Lungs: Normal work of breathing. Neurologic: No focal deficits.   Lab Results  Component Value Date   CREATININE 1.00 06/22/2019   BUN 15 06/22/2019   NA 140 06/22/2019   K 4.6 06/22/2019   CL 101 06/22/2019   CO2 33 (H) 06/22/2019   Lab Results  Component Value Date   ALT 11 12/24/2019   AST 18 12/24/2019   ALKPHOS 82 05/28/2019   BILITOT 0.8 12/24/2019   Lab Results  Component Value Date   HGBA1C 5.3 12/24/2019   HGBA1C 5.3 06/22/2019   HGBA1C 5.4 01/31/2019    HGBA1C 5.5 08/10/2018   HGBA1C 5.2 07/30/2016   Lab Results  Component Value Date   INSULIN 10.7 04/03/2019   Lab Results  Component Value Date   TSH 0.66 02/07/2020   Lab Results  Component Value Date   CHOL 190 12/24/2019   HDL 88 12/24/2019   LDLCALC 84 12/24/2019   TRIG 89 12/24/2019   CHOLHDL 2.2 12/24/2019   Lab Results  Component Value Date   WBC 5.4 06/22/2019   HGB 12.9 06/22/2019   HCT 39.5 06/22/2019   MCV 93.2 06/22/2019   PLT 293 06/22/2019   Lab Results  Component Value Date   IRON 108 05/28/2019   TIBC 363 05/28/2019   FERRITIN 36 05/28/2019   Attestation Statements:   Reviewed by clinician on day of visit: allergies, medications, problem list, medical history, surgical history, family history, social history, and previous encounter notes.  I, Water quality scientist, CMA, am acting as transcriptionist for Briscoe Deutscher, DO  I have reviewed the above documentation for accuracy and completeness, and I agree with the above. Briscoe Deutscher, DO

## 2020-06-10 ENCOUNTER — Other Ambulatory Visit (INDEPENDENT_AMBULATORY_CARE_PROVIDER_SITE_OTHER): Payer: Self-pay | Admitting: Family Medicine

## 2020-06-10 ENCOUNTER — Other Ambulatory Visit: Payer: Self-pay | Admitting: Internal Medicine

## 2020-06-10 DIAGNOSIS — E8881 Metabolic syndrome: Secondary | ICD-10-CM

## 2020-06-12 ENCOUNTER — Other Ambulatory Visit: Payer: Self-pay | Admitting: Internal Medicine

## 2020-06-12 MED ORDER — ALPRAZOLAM 1 MG PO TABS: 1.0000 mg | ORAL_TABLET | Freq: Two times a day (BID) | ORAL | 1 refills | Status: DC | PRN

## 2020-06-12 NOTE — Telephone Encounter (Signed)
Received Oceanographer request from  [facility], [place] - [address] Phone:  [phone]  Fax:  [phone]       Medication - ALPRAZolam Duanne Moron) 1 MG tablet   Last Refill - 04/07/2020  Last OV - 02/11/20  Last CPE - 06/25/2019  Next Appointment -  06/26/2020

## 2020-06-20 ENCOUNTER — Other Ambulatory Visit: Payer: 59 | Admitting: Internal Medicine

## 2020-06-26 ENCOUNTER — Encounter: Payer: 59 | Admitting: Internal Medicine

## 2020-07-01 ENCOUNTER — Encounter (INDEPENDENT_AMBULATORY_CARE_PROVIDER_SITE_OTHER): Payer: Self-pay | Admitting: Family Medicine

## 2020-07-01 ENCOUNTER — Other Ambulatory Visit (INDEPENDENT_AMBULATORY_CARE_PROVIDER_SITE_OTHER): Payer: Self-pay | Admitting: Family Medicine

## 2020-07-01 ENCOUNTER — Other Ambulatory Visit: Payer: Self-pay

## 2020-07-01 ENCOUNTER — Ambulatory Visit (INDEPENDENT_AMBULATORY_CARE_PROVIDER_SITE_OTHER): Payer: 59 | Admitting: Family Medicine

## 2020-07-01 VITALS — BP 126/85 | HR 89 | Temp 98.0°F | Ht 62.0 in | Wt 162.0 lb

## 2020-07-01 DIAGNOSIS — F908 Attention-deficit hyperactivity disorder, other type: Secondary | ICD-10-CM

## 2020-07-01 DIAGNOSIS — I10 Essential (primary) hypertension: Secondary | ICD-10-CM

## 2020-07-01 DIAGNOSIS — E8881 Metabolic syndrome: Secondary | ICD-10-CM | POA: Diagnosis not present

## 2020-07-01 DIAGNOSIS — E7849 Other hyperlipidemia: Secondary | ICD-10-CM | POA: Diagnosis not present

## 2020-07-01 DIAGNOSIS — Z6833 Body mass index (BMI) 33.0-33.9, adult: Secondary | ICD-10-CM

## 2020-07-01 DIAGNOSIS — E88819 Insulin resistance, unspecified: Secondary | ICD-10-CM

## 2020-07-01 DIAGNOSIS — Z9189 Other specified personal risk factors, not elsewhere classified: Secondary | ICD-10-CM | POA: Diagnosis not present

## 2020-07-01 DIAGNOSIS — E669 Obesity, unspecified: Secondary | ICD-10-CM

## 2020-07-01 DIAGNOSIS — E66811 Obesity, class 1: Secondary | ICD-10-CM

## 2020-07-01 MED ORDER — LISDEXAMFETAMINE DIMESYLATE 20 MG PO CAPS: 20.0000 mg | ORAL_CAPSULE | Freq: Every day | ORAL | 0 refills | Status: DC

## 2020-07-01 MED ORDER — METFORMIN HCL 500 MG PO TABS
1.0000 | ORAL_TABLET | Freq: Two times a day (BID) | ORAL | 0 refills | Status: DC
Start: 2020-07-01 — End: 2020-08-05

## 2020-07-02 LAB — COMPREHENSIVE METABOLIC PANEL
ALT: 11 IU/L (ref 0–32)
AST: 19 IU/L (ref 0–40)
Albumin/Globulin Ratio: 1.9 (ref 1.2–2.2)
Albumin: 4.9 g/dL (ref 3.8–4.9)
Alkaline Phosphatase: 97 IU/L (ref 44–121)
BUN/Creatinine Ratio: 11 (ref 9–23)
BUN: 10 mg/dL (ref 6–24)
Bilirubin Total: 1 mg/dL (ref 0.0–1.2)
CO2: 26 mmol/L (ref 20–29)
Calcium: 10.2 mg/dL (ref 8.7–10.2)
Chloride: 98 mmol/L (ref 96–106)
Creatinine, Ser: 0.87 mg/dL (ref 0.57–1.00)
Globulin, Total: 2.6 g/dL (ref 1.5–4.5)
Glucose: 84 mg/dL (ref 65–99)
Potassium: 4.1 mmol/L (ref 3.5–5.2)
Sodium: 141 mmol/L (ref 134–144)
Total Protein: 7.5 g/dL (ref 6.0–8.5)
eGFR: 79 mL/min/{1.73_m2} (ref >59)

## 2020-07-02 LAB — TSH: TSH: 0.577 u[IU]/mL (ref 0.450–4.500)

## 2020-07-02 LAB — T4, FREE: Free T4: 1.85 ng/dL — ABNORMAL HIGH (ref 0.82–1.77)

## 2020-07-08 NOTE — Progress Notes (Signed)
Chief Complaint:   OBESITY Rebecca Mccoy is here to discuss her progress with her obesity treatment plan along with follow-up of her obesity related diagnoses.   Today's visit was #: 33 Starting weight: 183 lbs Starting date: 04/03/2019 Today's weight: 162 lbs Today's date: 07/01/2020 Weight change since last visit: 1 lb Total lbs lost to date: 21 lbs Body mass index is 29.63 kg/m.  Total weight loss percentage to date: -11.48%  Interim History:  Rebecca Mccoy says she is still foggy-headed.  Still not able to focus.  She reports that phentermine gives her a dry mouth.   Current Meal Plan: the Category 1 Plan for 90% of the time.  Current Exercise Plan: Walking 2 miles 2-3 times per week. Current Anti-Obesity Medications: phentermine 15 mg daily. Side effects: Dry mouth.  Assessment/Plan:   Orders Placed This Encounter  Procedures   Comprehensive metabolic panel   TSH   T4, free   Meds ordered this encounter  Medications   metFORMIN (GLUCOPHAGE) 500 MG tablet    Sig: Take 1 tablet (500 mg total) by mouth 2 (two) times daily.    Dispense:  60 tablet    Refill:  0   lisdexamfetamine (VYVANSE) 20 MG capsule    Sig: Take 1 capsule (20 mg total) by mouth daily.    Dispense:  30 capsule    Refill:  0   1. Other hyperlipidemia Course: Controlled. Lipid-lowering medications: Crestor 5 mg daily.   Plan:  Continue Crestor.  Dietary changes: Increase soluble fiber, decrease simple carbohydrates, decrease saturated fat. Exercise changes: Moderate to vigorous-intensity aerobic activity 150 minutes per week or as tolerated. We will continue to monitor along with PCP/specialists as it pertains to her weight loss journey.  Will check labs today.  Lab Results  Component Value Date   CHOL 190 12/24/2019   HDL 88 12/24/2019   LDLCALC 84 12/24/2019   TRIG 89 12/24/2019   CHOLHDL 2.2 12/24/2019   Lab Results  Component Value Date   ALT 11 07/01/2020   AST 19 07/01/2020   ALKPHOS 97  07/01/2020   BILITOT 1.0 07/01/2020   The 10-year ASCVD risk score [name], et al., 2013) is: 3%   Values used to calculate the score:     Age: 55 years     Sex: Female     Is Non-Hispanic African American: No     Diabetic: Yes     Tobacco smoker: No     Systolic Blood Pressure: 211 mmHg     Is BP treated: Yes     HDL Cholesterol: 88 mg/dL     Total Cholesterol: 190 mg/dL  2. Essential hypertension At goal. Medications: Maxzide 37.5-25 mg daily.   Plan:  Will check labs today.  Avoid buying foods that are: processed, frozen, or prepackaged to avoid excess salt. We will watch for signs of hypotension as she continues lifestyle modifications.  BP Readings from Last 3 Encounters:  07/01/20 126/85  05/28/20 114/78  04/14/20 104/72   Lab Results  Component Value Date   CREATININE 0.87 07/01/2020   3. Insulin resistance Not at goal. Goal is HgbA1c < 5.7, fasting insulin closer to 5.  Medication: metformin 500 mg twice daily.    Plan:  Continue metformin.  Will refill today and check labs.  She will continue to focus on protein-rich, low simple carbohydrate foods. We reviewed the importance of hydration, regular exercise for stress reduction, and restorative sleep.   Lab Results  Component Value Date   HGBA1C 5.3 12/24/2019   Lab Results  Component Value Date   INSULIN 10.7 04/03/2019   - Refill metFORMIN (GLUCOPHAGE) 500 MG tablet; Take 1 tablet (500 mg total) by mouth 2 (two) times daily.  Dispense: 60 tablet; Refill: 0  4. Attention deficit hyperactivity disorder (ADHD), other type Rebecca Mccoy will start Vyvanse 20 mg daily for ADHD, as per below.  I have consulted the [place] Controlled Substances Registry for this patient, and feel the risk/benefit ratio today is favorable for proceeding with this prescription for a controlled substance. The patient understands monitoring parameters and red flags.   - Start lisdexamfetamine (VYVANSE) 20 MG capsule; Take 1 capsule (20 mg  total) by mouth daily.  Dispense: 30 capsule; Refill: 0  5. At risk for deficient intake of food Rebecca Mccoy was given extensive education and counseling today of more than 8 minutes on risks associated with deficient food intake.  Counseled her on the importance of following our prescribed meal plan and eating adequate amounts of protein.  Discussed with Rebecca Mccoy that inadequate food intake over longer periods of time can slow their metabolism down significantly.   6. Obesity, current BMI 29.8  Course: Rebecca Mccoy is currently in the action stage of change. As such, her goal is to continue with weight loss efforts.   Nutrition goals: She has agreed to the Category 1 Plan.   Exercise goals: For substantial health benefits, adults should do at least 150 minutes (2 hours and 30 minutes) a week of moderate-intensity, or 75 minutes (1 hour and 15 minutes) a week of vigorous-intensity aerobic physical activity, or an equivalent combination of moderate- and vigorous-intensity aerobic activity. Aerobic activity should be performed in episodes of at least 10 minutes, and preferably, it should be spread throughout the week.  Behavioral modification strategies: increasing lean protein intake, decreasing simple carbohydrates, increasing vegetables, and increasing water intake.  Rebecca Mccoy has agreed to follow-up with our clinic in 4 weeks. She was informed of the importance of frequent follow-up visits to maximize her success with intensive lifestyle modifications for her multiple health conditions.   Rebecca Mccoy was informed we would discuss her lab results at her next visit unless there is a critical issue that needs to be addressed sooner. Rebecca Mccoy agreed to keep her next visit at the agreed upon time to discuss these results.  Objective:   Blood pressure 126/85, pulse 89, temperature 98 F (36.7 C), temperature source Oral, height 5\' 2"  (1.575 m), weight 162 lb (73.5 kg), SpO2 99 %. Body mass index is 29.63  kg/m.  General: Cooperative, alert, well developed, in no acute distress. HEENT: Conjunctivae and lids unremarkable. Cardiovascular: Regular rhythm.  Lungs: Normal work of breathing. Neurologic: No focal deficits.   Lab Results  Component Value Date   CREATININE 0.87 07/01/2020   BUN 10 07/01/2020   NA 141 07/01/2020   K 4.1 07/01/2020   CL 98 07/01/2020   CO2 26 07/01/2020   Lab Results  Component Value Date   ALT 11 07/01/2020   AST 19 07/01/2020   ALKPHOS 97 07/01/2020   BILITOT 1.0 07/01/2020   Lab Results  Component Value Date   HGBA1C 5.3 12/24/2019   HGBA1C 5.3 06/22/2019   HGBA1C 5.4 01/31/2019   HGBA1C 5.5 08/10/2018   HGBA1C 5.2 07/30/2016   Lab Results  Component Value Date   INSULIN 10.7 04/03/2019   Lab Results  Component Value Date   TSH 0.577 07/01/2020   Lab Results  Component Value Date   CHOL 190 12/24/2019   HDL 88 12/24/2019   LDLCALC 84 12/24/2019   TRIG 89 12/24/2019   CHOLHDL 2.2 12/24/2019   Lab Results  Component Value Date   WBC 5.4 06/22/2019   HGB 12.9 06/22/2019   HCT 39.5 06/22/2019   MCV 93.2 06/22/2019   PLT 293 06/22/2019   Lab Results  Component Value Date   IRON 108 05/28/2019   TIBC 363 05/28/2019   FERRITIN 36 05/28/2019   Attestation Statements:   Reviewed by clinician on day of visit: allergies, medications, problem list, medical history, surgical history, family history, social history, and previous encounter notes.  I, Water quality scientist, CMA, am acting as transcriptionist for Briscoe Deutscher, DO  I have reviewed the above documentation for accuracy and completeness, and I agree with the above. Briscoe Deutscher, DO

## 2020-07-13 ENCOUNTER — Encounter (INDEPENDENT_AMBULATORY_CARE_PROVIDER_SITE_OTHER): Payer: Self-pay | Admitting: Family Medicine

## 2020-07-14 NOTE — Telephone Encounter (Signed)
Last OV with Dr Wallace 

## 2020-08-04 ENCOUNTER — Other Ambulatory Visit: Payer: Self-pay | Admitting: Internal Medicine

## 2020-08-05 ENCOUNTER — Other Ambulatory Visit: Payer: Self-pay

## 2020-08-05 ENCOUNTER — Encounter (INDEPENDENT_AMBULATORY_CARE_PROVIDER_SITE_OTHER): Payer: Self-pay | Admitting: Family Medicine

## 2020-08-05 ENCOUNTER — Ambulatory Visit (INDEPENDENT_AMBULATORY_CARE_PROVIDER_SITE_OTHER): Payer: 59 | Admitting: Family Medicine

## 2020-08-05 VITALS — BP 123/80 | HR 96 | Temp 98.1°F | Ht 62.0 in | Wt 165.0 lb

## 2020-08-05 DIAGNOSIS — Z9189 Other specified personal risk factors, not elsewhere classified: Secondary | ICD-10-CM

## 2020-08-05 DIAGNOSIS — E039 Hypothyroidism, unspecified: Secondary | ICD-10-CM

## 2020-08-05 DIAGNOSIS — E669 Obesity, unspecified: Secondary | ICD-10-CM | POA: Diagnosis not present

## 2020-08-05 DIAGNOSIS — F908 Attention-deficit hyperactivity disorder, other type: Secondary | ICD-10-CM

## 2020-08-05 DIAGNOSIS — E8881 Metabolic syndrome: Secondary | ICD-10-CM | POA: Diagnosis not present

## 2020-08-05 DIAGNOSIS — Z6833 Body mass index (BMI) 33.0-33.9, adult: Secondary | ICD-10-CM

## 2020-08-06 MED ORDER — METFORMIN HCL 500 MG PO TABS: 500.0000 mg | ORAL_TABLET | Freq: Two times a day (BID) | ORAL | 0 refills | Status: DC

## 2020-08-15 NOTE — Progress Notes (Signed)
Chief Complaint:   OBESITY Rebecca Mccoy is here to discuss her progress with her obesity treatment plan along with follow-up of her obesity related diagnoses.   Today's visit was #: 55 Starting weight: 183 lbs Starting date: 04/03/2019 Today's weight: 165 lbs Today's date: 08/05/2020 Weight change since last visit: +3 lbs Total lbs lost to date: 18 lbs Body mass index is 30.18 kg/m.  Total weight loss percentage to date: -9.84%  Interim History:  Rebecca Mccoy did well with Vyvanse for the first four days.  She felt that her attention/focus was much improved.  She developed a headache after.  Endorses working in the heat of the day and likely dehydrated.  Current Meal Plan: the Category 1 Plan for 90% of the time.  Current Exercise Plan: Walking for 2 miles 3-5 times per week. Current Anti-Obesity Medications: Holding phentermine.  Assessment/Plan:   Meds ordered this encounter  Medications   metFORMIN (GLUCOPHAGE) 500 MG tablet    Sig: Take 1 tablet (500 mg total) by mouth 2 (two) times daily.    Dispense:  60 tablet    Refill:  0    1. Insulin resistance At goal. Goal is HgbA1c < 5.7, fasting insulin closer to 5.  Medication: metformin 500 mg twice daily.    Plan:  Will refill metformin today.  She will continue to focus on protein-rich, low simple carbohydrate foods. We reviewed the importance of hydration, regular exercise for stress reduction, and restorative sleep.   Lab Results  Component Value Date   HGBA1C 5.3 12/24/2019   Lab Results  Component Value Date   INSULIN 10.7 04/03/2019   - Refill metFORMIN (GLUCOPHAGE) 500 MG tablet; Take 1 tablet (500 mg total) by mouth 2 (two) times daily.  Dispense: 60 tablet; Refill: 0  2. Acquired hypothyroidism Course: controlled. Medication: levothyroxine 125 mcg daily.   Plan: Patient was instructed not to take MVM or iron within 4 hours of taking thyroid medications. This issue is managed by Dr. Renold Genta. We will continue to  monitor alongside Endocrinology/PCP as it relates to her weight loss journey.   Lab Results  Component Value Date   TSH 0.577 07/01/2020   3. Attention deficit hyperactivity disorder (ADHD), other type Rebecca Mccoy was taking Vyvanse 20 mg daily.  She says that it helped with her attention and focus but caused the side effect of headache.  Plan:  Will refer to [facility] for evaluation/treatment of ADHD.  4. At risk for dehydration Rebecca Mccoy is at higher than average risk of dehydration.  Rebecca Mccoy was given more than 9 minutes of proper hydration counseling today.  We discussed the signs and symptoms of dehydration, some of which may include muscle cramping, constipation or even orthostatic symptoms.  Counseling on the prevention of dehydration was also provided today.  Rebecca Mccoy is at risk for dehydration due to weight loss, lifestyle and behavorial habits and possibly due to taking certain medication(s).  She was encouraged to adequately hydrate and monitor fluid status to avoid dehydration as well as weight loss plateaus.    5. Obesity with current BMI Of 30.2  Course: Rebecca Mccoy is currently in the action stage of change. As such, her goal is to continue with weight loss efforts.   Nutrition goals: She has agreed to the Category 1 Plan.   Exercise goals:  As is, but out of the heat this week.  Behavioral modification strategies: increasing water intake and electrolytes.  Rebecca Mccoy has agreed to follow-up with our clinic in  4 weeks. She was informed of the importance of frequent follow-up visits to maximize her success with intensive lifestyle modifications for her multiple health conditions.   Objective:   Blood pressure 123/80, pulse 96, temperature 98.1 F (36.7 C), temperature source Oral, height '5\' 2"'$  (1.575 m), weight 165 lb (74.8 kg), SpO2 98 %. Body mass index is 30.18 kg/m.  General: Cooperative, alert, well developed, in no acute distress. HEENT: Conjunctivae and lids  unremarkable. Cardiovascular: Regular rhythm.  Lungs: Normal work of breathing. Neurologic: No focal deficits.   Lab Results  Component Value Date   CREATININE 0.87 07/01/2020   BUN 10 07/01/2020   NA 141 07/01/2020   K 4.1 07/01/2020   CL 98 07/01/2020   CO2 26 07/01/2020   Lab Results  Component Value Date   ALT 11 07/01/2020   AST 19 07/01/2020   ALKPHOS 97 07/01/2020   BILITOT 1.0 07/01/2020   Lab Results  Component Value Date   HGBA1C 5.3 12/24/2019   HGBA1C 5.3 06/22/2019   HGBA1C 5.4 01/31/2019   HGBA1C 5.5 08/10/2018   HGBA1C 5.2 07/30/2016   Lab Results  Component Value Date   INSULIN 10.7 04/03/2019   Lab Results  Component Value Date   TSH 0.577 07/01/2020   Lab Results  Component Value Date   CHOL 190 12/24/2019   HDL 88 12/24/2019   LDLCALC 84 12/24/2019   TRIG 89 12/24/2019   CHOLHDL 2.2 12/24/2019   Lab Results  Component Value Date   VD25OH 67.3 05/28/2019   VD25OH 38 06/22/2018   VD25OH 53 05/13/2017   Lab Results  Component Value Date   WBC 5.4 06/22/2019   HGB 12.9 06/22/2019   HCT 39.5 06/22/2019   MCV 93.2 06/22/2019   PLT 293 06/22/2019   Lab Results  Component Value Date   IRON 108 05/28/2019   TIBC 363 05/28/2019   FERRITIN 36 05/28/2019   Attestation Statements:   Reviewed by clinician on day of visit: allergies, medications, problem list, medical history, surgical history, family history, social history, and previous encounter notes.  I, Water quality scientist, CMA, am acting as transcriptionist for Briscoe Deutscher, DO  I have reviewed the above documentation for accuracy and completeness, and I agree with the above. Briscoe Deutscher, DO

## 2020-09-03 ENCOUNTER — Ambulatory Visit (INDEPENDENT_AMBULATORY_CARE_PROVIDER_SITE_OTHER): Payer: 59 | Admitting: Family Medicine

## 2020-09-03 ENCOUNTER — Encounter (INDEPENDENT_AMBULATORY_CARE_PROVIDER_SITE_OTHER): Payer: Self-pay | Admitting: Family Medicine

## 2020-09-03 ENCOUNTER — Other Ambulatory Visit: Payer: Self-pay

## 2020-09-03 VITALS — BP 107/76 | HR 93 | Temp 98.6°F | Ht 62.0 in | Wt 165.0 lb

## 2020-09-03 DIAGNOSIS — E8881 Metabolic syndrome: Secondary | ICD-10-CM

## 2020-09-03 DIAGNOSIS — Z780 Asymptomatic menopausal state: Secondary | ICD-10-CM | POA: Diagnosis not present

## 2020-09-03 DIAGNOSIS — Z6833 Body mass index (BMI) 33.0-33.9, adult: Secondary | ICD-10-CM

## 2020-09-03 DIAGNOSIS — I471 Supraventricular tachycardia: Secondary | ICD-10-CM

## 2020-09-03 DIAGNOSIS — R5383 Other fatigue: Secondary | ICD-10-CM

## 2020-09-03 DIAGNOSIS — Z9189 Other specified personal risk factors, not elsewhere classified: Secondary | ICD-10-CM

## 2020-09-03 DIAGNOSIS — E669 Obesity, unspecified: Secondary | ICD-10-CM

## 2020-09-03 MED ORDER — METFORMIN HCL 500 MG PO TABS: 500.0000 mg | ORAL_TABLET | Freq: Two times a day (BID) | ORAL | 0 refills | Status: DC

## 2020-09-05 ENCOUNTER — Other Ambulatory Visit: Payer: 59 | Admitting: Internal Medicine

## 2020-09-05 ENCOUNTER — Other Ambulatory Visit: Payer: Self-pay

## 2020-09-05 DIAGNOSIS — R000 Tachycardia, unspecified: Secondary | ICD-10-CM

## 2020-09-05 DIAGNOSIS — I471 Supraventricular tachycardia: Secondary | ICD-10-CM

## 2020-09-05 DIAGNOSIS — E7849 Other hyperlipidemia: Secondary | ICD-10-CM

## 2020-09-05 DIAGNOSIS — K5909 Other constipation: Secondary | ICD-10-CM

## 2020-09-05 DIAGNOSIS — E8881 Metabolic syndrome: Secondary | ICD-10-CM

## 2020-09-05 DIAGNOSIS — F908 Attention-deficit hyperactivity disorder, other type: Secondary | ICD-10-CM

## 2020-09-05 DIAGNOSIS — R5383 Other fatigue: Secondary | ICD-10-CM

## 2020-09-05 DIAGNOSIS — Z780 Asymptomatic menopausal state: Secondary | ICD-10-CM

## 2020-09-05 DIAGNOSIS — I10 Essential (primary) hypertension: Secondary | ICD-10-CM

## 2020-09-05 DIAGNOSIS — E039 Hypothyroidism, unspecified: Secondary | ICD-10-CM

## 2020-09-05 DIAGNOSIS — E782 Mixed hyperlipidemia: Secondary | ICD-10-CM

## 2020-09-05 DIAGNOSIS — E669 Obesity, unspecified: Secondary | ICD-10-CM

## 2020-09-05 DIAGNOSIS — R7302 Impaired glucose tolerance (oral): Secondary | ICD-10-CM

## 2020-09-05 DIAGNOSIS — Z9189 Other specified personal risk factors, not elsewhere classified: Secondary | ICD-10-CM

## 2020-09-06 LAB — COMPLETE METABOLIC PANEL WITH GFR
AG Ratio: 1.7 (calc) (ref 1.0–2.5)
ALT: 13 U/L (ref 6–29)
AST: 16 U/L (ref 10–35)
Albumin: 4.6 g/dL (ref 3.6–5.1)
Alkaline phosphatase (APISO): 90 U/L (ref 37–153)
BUN: 11 mg/dL (ref 7–25)
CO2: 33 mmol/L — ABNORMAL HIGH (ref 20–32)
Calcium: 10 mg/dL (ref 8.6–10.4)
Chloride: 101 mmol/L (ref 98–110)
Creat: 0.9 mg/dL (ref 0.50–1.03)
Globulin: 2.7 g/dL (calc) (ref 1.9–3.7)
Glucose, Bld: 87 mg/dL (ref 65–99)
Potassium: 4.6 mmol/L (ref 3.5–5.3)
Sodium: 141 mmol/L (ref 135–146)
Total Bilirubin: 0.5 mg/dL (ref 0.2–1.2)
Total Protein: 7.3 g/dL (ref 6.1–8.1)
eGFR: 75 mL/min/{1.73_m2} (ref >=60)

## 2020-09-06 LAB — IRON,TIBC AND FERRITIN PANEL
%SAT: 4 % (calc) — ABNORMAL LOW (ref 16–45)
Ferritin: 9 ng/mL — ABNORMAL LOW (ref 16–232)
Iron: 19 ug/dL — ABNORMAL LOW (ref 45–160)
TIBC: 487 mcg/dL (calc) — ABNORMAL HIGH (ref 250–450)

## 2020-09-06 LAB — LIPID PANEL
Cholesterol: 188 mg/dL (ref <200)
HDL: 87 mg/dL (ref >=50)
LDL Cholesterol (Calc): 78 mg/dL (calc)
Non-HDL Cholesterol (Calc): 101 mg/dL (calc) (ref <130)
Total CHOL/HDL Ratio: 2.2 (calc) (ref <5.0)
Triglycerides: 125 mg/dL (ref <150)

## 2020-09-06 LAB — TSH: TSH: 0.82 mIU/L

## 2020-09-06 LAB — HEMOGLOBIN A1C
Hgb A1c MFr Bld: 5.4 % of total Hgb (ref <5.7)
Mean Plasma Glucose: 108 mg/dL
eAG (mmol/L): 6 mmol/L

## 2020-09-06 LAB — CBC WITH DIFFERENTIAL/PLATELET
Absolute Monocytes: 497 cells/uL (ref 200–950)
Basophils Absolute: 40 cells/uL (ref 0–200)
Basophils Relative: 0.9 %
Eosinophils Absolute: 110 cells/uL (ref 15–500)
Eosinophils Relative: 2.5 %
HCT: 34.9 % — ABNORMAL LOW (ref 35.0–45.0)
Hemoglobin: 10.7 g/dL — ABNORMAL LOW (ref 11.7–15.5)
Lymphs Abs: 1861 cells/uL (ref 850–3900)
MCH: 25.5 pg — ABNORMAL LOW (ref 27.0–33.0)
MCHC: 30.7 g/dL — ABNORMAL LOW (ref 32.0–36.0)
MCV: 83.3 fL (ref 80.0–100.0)
MPV: 9.4 fL (ref 7.5–12.5)
Monocytes Relative: 11.3 %
Neutro Abs: 1892 cells/uL (ref 1500–7800)
Neutrophils Relative %: 43 %
Platelets: 381 10*3/uL (ref 140–400)
RBC: 4.19 10*6/uL (ref 3.80–5.10)
RDW: 14.5 % (ref 11.0–15.0)
Total Lymphocyte: 42.3 %
WBC: 4.4 10*3/uL (ref 3.8–10.8)

## 2020-09-06 LAB — T4, FREE: Free T4: 1.5 ng/dL (ref 0.8–1.8)

## 2020-09-08 NOTE — Progress Notes (Signed)
Chief Complaint:   OBESITY Rebecca Mccoy is here to discuss her progress with her obesity treatment plan along with follow-up of her obesity related diagnoses.   Today's visit was #: 6 Starting weight: 183 lbs Starting date: 04/03/2019 Today's weight: 165 lbs Today's date: 09/03/2020 Weight change since last visit: 0 Total lbs lost to date: 18 lbs Body mass index is 30.18 kg/m.  Total weight loss percentage to date: -9.84%  Current Meal Plan: the Category 1 Plan for 85% of the time.  Current Exercise Plan: Walking for 30+ minutes 4-5 times per week.  Interim History:  Rebecca Mccoy says that her headaches have resolved with hydration.  She has an appointment with Cardiology in September.  She will be seeing the dentist regarding her upper tooth line discomfort.  She says she has been craving ice.  She stopped taking metoprolol and now has episodes of tachycardia.  She gave blood 3 months ago and her Hgb was around 12.  Assessment/Plan:   1. Paroxysmal supraventricular tachycardia ([place]) She has labs this Fiday with her PCP - asked to add CBC, TIBC, TSH, free T4.  2. Postmenopausal GYN is Dr. Talbert Nan. She is interested in speaking with Dr. Talbert Nan about HRT and will make an appointment.   3. Other fatigue Worsening.  Will sleep in each morning. Labs will be helpful. Reviewed the importance of hydration. Tachycardia may be contributing.   4. Insulin resistance At goal. Goal is HgbA1c < 5.7, fasting insulin closer to 5.  Medication: metformin 500 mg twice daily.    Plan:  Will refill metformin today.  She will continue to focus on protein-rich, low simple carbohydrate foods. We reviewed the importance of hydration, regular exercise for stress reduction, and restorative sleep.   Lab Results  Component Value Date   HGBA1C 5.4 09/05/2020   Lab Results  Component Value Date   INSULIN 10.7 04/03/2019   - Refill metFORMIN (GLUCOPHAGE) 500 MG tablet; Take 1 tablet (500 mg total) by mouth 2  (two) times daily.  Dispense: 180 tablet; Refill: 0  5. At risk for heart disease Due to Rebecca Mccoy's current state of health and medical condition(s), she is at a higher risk for heart disease.  This puts the patient at much greater risk to subsequently develop cardiopulmonary conditions that can significantly affect patient's quality of life in a negative manner.    At least 8 minutes were spent on counseling Rebecca Mccoy about these concerns today. Evidence-based interventions for health behavior change were utilized today including the discussion of self monitoring techniques, problem-solving barriers, and SMART goal setting techniques.  Specifically, regarding patient's less desirable eating habits and patterns, we employed the technique of small changes when Rebecca Mccoy has not been able to fully commit to her prudent nutritional plan.  6. Obesity with current BMI Of 30.2  Course: Rebecca Mccoy is currently in the action stage of change. As such, her goal is to continue with weight loss efforts.   Nutrition goals: She has agreed to the Category 1 Plan.   Exercise goals:  As is.  Behavioral modification strategies: increasing lean protein intake, decreasing simple carbohydrates, increasing vegetables, and increasing water intake.  Rebecca Mccoy has agreed to follow-up with our clinic in 4 weeks. She was informed of the importance of frequent follow-up visits to maximize her success with intensive lifestyle modifications for her multiple health conditions.   Objective:   Blood pressure 107/76, pulse 93, temperature 98.6 F (37 C), temperature source Oral, height '5\' 2"'$  (1.575 m),  weight 165 lb (74.8 kg), SpO2 98 %. Body mass index is 30.18 kg/m.  General: Cooperative, alert, well developed, in no acute distress. HEENT: Conjunctivae and lids unremarkable. Cardiovascular: Regular rhythm.  Lungs: Normal work of breathing. Neurologic: No focal deficits.   Lab Results  Component Value Date   CREATININE 0.90 09/05/2020    BUN 11 09/05/2020   NA 141 09/05/2020   K 4.6 09/05/2020   CL 101 09/05/2020   CO2 33 (H) 09/05/2020   Lab Results  Component Value Date   ALT 13 09/05/2020   AST 16 09/05/2020   ALKPHOS 97 07/01/2020   BILITOT 0.5 09/05/2020   Lab Results  Component Value Date   HGBA1C 5.4 09/05/2020   HGBA1C 5.3 12/24/2019   HGBA1C 5.3 06/22/2019   HGBA1C 5.4 01/31/2019   HGBA1C 5.5 08/10/2018   Lab Results  Component Value Date   INSULIN 10.7 04/03/2019   Lab Results  Component Value Date   TSH 0.82 09/05/2020   Lab Results  Component Value Date   CHOL 188 09/05/2020   HDL 87 09/05/2020   LDLCALC 78 09/05/2020   TRIG 125 09/05/2020   CHOLHDL 2.2 09/05/2020   Lab Results  Component Value Date   VD25OH 67.3 05/28/2019   VD25OH 38 06/22/2018   VD25OH 53 05/13/2017   Lab Results  Component Value Date   WBC 4.4 09/05/2020   HGB 10.7 (L) 09/05/2020   HCT 34.9 (L) 09/05/2020   MCV 83.3 09/05/2020   PLT 381 09/05/2020   Lab Results  Component Value Date   IRON 19 (L) 09/05/2020   TIBC 487 (H) 09/05/2020   FERRITIN 9 (L) 09/05/2020   Attestation Statements:   Reviewed by clinician on day of visit: allergies, medications, problem list, medical history, surgical history, family history, social history, and previous encounter notes.  I, Water quality scientist, CMA, am acting as transcriptionist for Briscoe Deutscher, DO  I have reviewed the above documentation for accuracy and completeness, and I agree with the above. Briscoe Deutscher, DO

## 2020-09-09 ENCOUNTER — Ambulatory Visit (INDEPENDENT_AMBULATORY_CARE_PROVIDER_SITE_OTHER): Payer: 59 | Admitting: Internal Medicine

## 2020-09-09 ENCOUNTER — Encounter: Payer: Self-pay | Admitting: Internal Medicine

## 2020-09-09 ENCOUNTER — Other Ambulatory Visit: Payer: Self-pay

## 2020-09-09 VITALS — BP 130/90 | HR 94 | Ht 62.0 in | Wt 168.0 lb

## 2020-09-09 DIAGNOSIS — Z780 Asymptomatic menopausal state: Secondary | ICD-10-CM

## 2020-09-09 DIAGNOSIS — K5904 Chronic idiopathic constipation: Secondary | ICD-10-CM | POA: Diagnosis not present

## 2020-09-09 DIAGNOSIS — Z1211 Encounter for screening for malignant neoplasm of colon: Secondary | ICD-10-CM | POA: Diagnosis not present

## 2020-09-09 DIAGNOSIS — E8881 Metabolic syndrome: Secondary | ICD-10-CM

## 2020-09-09 DIAGNOSIS — D509 Iron deficiency anemia, unspecified: Secondary | ICD-10-CM

## 2020-09-09 DIAGNOSIS — Z0000 Encounter for general adult medical examination without abnormal findings: Secondary | ICD-10-CM

## 2020-09-09 DIAGNOSIS — R7302 Impaired glucose tolerance (oral): Secondary | ICD-10-CM

## 2020-09-09 DIAGNOSIS — Z6830 Body mass index (BMI) 30.0-30.9, adult: Secondary | ICD-10-CM

## 2020-09-09 DIAGNOSIS — E782 Mixed hyperlipidemia: Secondary | ICD-10-CM

## 2020-09-09 DIAGNOSIS — E039 Hypothyroidism, unspecified: Secondary | ICD-10-CM | POA: Diagnosis not present

## 2020-09-09 DIAGNOSIS — I10 Essential (primary) hypertension: Secondary | ICD-10-CM

## 2020-09-09 DIAGNOSIS — Z8659 Personal history of other mental and behavioral disorders: Secondary | ICD-10-CM

## 2020-09-09 LAB — POCT URINALYSIS DIPSTICK
Appearance: NEGATIVE
Bilirubin, UA: NEGATIVE
Blood, UA: NEGATIVE
Glucose, UA: NEGATIVE
Ketones, UA: NEGATIVE
Leukocytes, UA: NEGATIVE
Nitrite, UA: NEGATIVE
Odor: NEGATIVE
Protein, UA: NEGATIVE
Spec Grav, UA: 1.015 (ref 1.010–1.025)
Urobilinogen, UA: 0.2 E.U./dL
pH, UA: 6 (ref 5.0–8.0)

## 2020-09-09 NOTE — Patient Instructions (Addendum)
You have new onset iron deficiency anemia which may be nutritional but GI causes need to be ruled out. Colonoscopy due in December (5 year recall) but am asking them to move this appt up because of anemia. Take iron sulfate 325 mg twice daily and RTC in 6 weeks for CBC, Fe TIBC.  Do not take Vyvanse as it is likely contributing to tachycardia.

## 2020-09-09 NOTE — Progress Notes (Signed)
Subjective:    Patient ID: Rebecca Mccoy, female    DOB: September 20, 1965, 55 y.o.   MRN: [id number]  HPI 55 year old Female seen today in person for health maintenance exam and evaluation of medical issues.  Patient attends [facility] for weight loss.  Her starting weight was 183 pounds in March 2021.  She now weighs 168 pounds.  Patient was treated with Vyvanse at [facility] which apparently did help her attention.  She developed a headache.  Has quit taking this medication.  She recently began to crave ice.  Says she gave blood some 3 months ago.  She stopped taking metoprolol and has had some tachycardia.  Recent fasting labs showed that she is iron deficient.  Total iron is 19 with ferritin being 9%.  Iron binding capacity is 487.  C-Met is within normal limits.  Lipid panel is normal.  Hemoglobin A1c is normal.  TSH and free T4 normal.  However hemoglobin is 10.7 g with MCV of 83.3.  Previously in June 2021 hemoglobin was 12.9 g and has been as high as 13.6 g in 2019.  Was tried on Crestor previously and says it caused myalgias.  She has history of familial hyperlipidemia.  She has hypothyroidism.  History of insomnia and anxiety but this is improved a good deal since she quit working full-time.  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  She had colonoscopy in December 2017.  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  Due to her anemia, I would like for her to have colonoscopy sooner than December 2022.  We will put in referral to gastroenterology.  History of recurrent MRSA infections but none recently.  Husband also has history of recurrent MRSA infections.  History of sleep apnea.  History of bacterial vaginosis and Candida vaginitis.  History of chondromalacia right knee in 2014.  History of cellulitis right thigh in 2014.  History of right distal phalanx  second toe fracture.  She had exercise tolerance test in 2018 for chest pain which was negative.  Had sclerotherapy for varicose veins in 2004.  History of allergic rhinitis, GE reflux.  History of menorrhagia due to fibroids in 2006.  History of bilateral carpal tunnel syndrome status post surgical release.  History of hypertension.  No known drug allergies.  IUD inserted in 2008.  Sebaceous cyst on her back removed 2008.  History of syncope in 2008 referred for echocardiogram and event monitor.  There have been no subsequent issues with syncope.  Had tonsillectomy 1972, bilateral tubal ligation 1979, abdominoplasty 2008.  Breast augmentation 2010.  Right carpal tunnel release February 2010.  Left carpal tunnel release March 2011.  Mirena IUD placed 2013.  History of anxiety disorder previously seen at a [facility] [place] [employer] [place] her previous husband was stationed.  Anxiety is stable at the present time.  History of palpitations.  No known drug allergies.  History of nonallergic rhinitis seen by allergist with negative indoor and outdoor panels.  No melena, bright red blood per rectum or bleeding hemorrhoids  Recently developed pica (craving ice)  In January she was feeling tired and saw Dr. Agustin Cree, Cardiologist.  She thought metoprolol was making her tired.  She tried discontinuing Maxide but it caused some weight gain.  She denied palpitations at that time.  Metoprolol was discontinued.  She was  continued on Crestor  Was advised that she could continue with Maxide.  Review of Systems  Respiratory: Negative.    Cardiovascular: Negative.   Gastrointestinal: Negative.   Genitourinary: Negative.   Neurological:        History of migraine headaches  Psychiatric/Behavioral:         History of anxiety      Objective:            Blood pressure 130/90 pulse 94 regular pulse oximetry 98% weight 168 pounds height 5 feet 2 inches BMI 30.73  Skin: Warm and  dry.  No cervical adenopathy.  No thyromegaly.  No carotid bruits.  Chest is clear to auscultation.  Bilateral breast implants.  Cardiac exam: Regular rate and rhythm without ectopy.  Abdomen is soft nondistended without hepatosplenomegaly masses or tenderness.  Stool is guaiac negative.  No masses on rectal exam.  GYN exam deferred to gynecologist, Dr. Talbert Nan and last seen September 2021.  Assessment:  Iron deficiency anemia-no history of bleeding hemorrhoids or melena.  Refer back to gastroenterology for evaluation.  Her colonoscopy is due in December.  She will be started on iron sulfate with follow-up in 6 weeks.  She has pica which is likely related to iron deficiency.  Hypothyroidism-TSH is stable on thyroid replacement  History of recurrent MRSA infections  History of pica and fatigue-likely related to iron deficiency anemia has had negative cardiology work-up last year complaining of palpitations.  Metoprolol was stopped at the time.  Pica related to iron deficiency.  Patient is to take iron supplementation 325 mg twice daily and follow-up here in 6 weeks for CBC, iron and iron binding capacity.  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  Now helping her husband.  History of obstructive sleep apnea  History of impaired glucose tolerance treated with metformin  History of lower extremity edema treated with Maxide 25  History of palpitations-do not take Vyvanse as it is likely contributing to tachycardia  Plan: Gastroenterology referral for repeat colonoscopy with setting of iron deficiency anemia.  Hemoglobin 10.7 g with MCV 83.3.  Ferritin is 36.  B12 536, folate 573.  Iron level 108.  Discontinue attention deficit disorder medication.  At her age she really does not need it and it can contribute to her history of palpitations.  She may continue with Crestor 5 mg 3 times  a week.  Continue with thyroid replacement medication at current dose.  Continue Xanax up to twice daily as needed for anxiety and sleep.  Continue metformin 500 mg twice daily.  May continue with Maxide 25 for lower extremity edema.  Needs to follow-up in 3 months after trial of iron therapy and colonoscopy.

## 2020-09-10 ENCOUNTER — Encounter: Payer: Self-pay | Admitting: Gastroenterology

## 2020-09-10 ENCOUNTER — Other Ambulatory Visit: Payer: Self-pay | Admitting: Internal Medicine

## 2020-09-10 LAB — HEMOCCULT GUIAC POC 1CARD (OFFICE): Fecal Occult Blood, POC: NEGATIVE

## 2020-09-10 NOTE — Addendum Note (Signed)
Addended by: Mady Haagensen on: 09/10/2020 11:48 AM   Modules accepted: Orders

## 2020-09-26 ENCOUNTER — Other Ambulatory Visit: Payer: Self-pay | Admitting: Internal Medicine

## 2020-10-03 ENCOUNTER — Other Ambulatory Visit: Payer: Self-pay

## 2020-10-03 ENCOUNTER — Ambulatory Visit: Payer: 59 | Admitting: Cardiology

## 2020-10-03 ENCOUNTER — Encounter: Payer: Self-pay | Admitting: Cardiology

## 2020-10-03 ENCOUNTER — Encounter: Payer: Self-pay | Admitting: Obstetrics and Gynecology

## 2020-10-03 VITALS — BP 122/82 | HR 84 | Ht 62.0 in | Wt 171.0 lb

## 2020-10-03 DIAGNOSIS — I10 Essential (primary) hypertension: Secondary | ICD-10-CM | POA: Diagnosis not present

## 2020-10-03 DIAGNOSIS — R7303 Prediabetes: Secondary | ICD-10-CM

## 2020-10-03 DIAGNOSIS — E785 Hyperlipidemia, unspecified: Secondary | ICD-10-CM

## 2020-10-03 DIAGNOSIS — R0600 Dyspnea, unspecified: Secondary | ICD-10-CM

## 2020-10-03 DIAGNOSIS — I471 Supraventricular tachycardia: Secondary | ICD-10-CM | POA: Diagnosis not present

## 2020-10-03 DIAGNOSIS — R0609 Other forms of dyspnea: Secondary | ICD-10-CM

## 2020-10-03 DIAGNOSIS — G4733 Obstructive sleep apnea (adult) (pediatric): Secondary | ICD-10-CM

## 2020-10-03 HISTORY — DX: Prediabetes: R73.03

## 2020-10-03 NOTE — Patient Instructions (Signed)
Medication Instructions:  Your physician recommends that you continue on your current medications as directed. Please refer to the Current Medication list given to you today.  *If you need a refill on your cardiac medications before your next appointment, please call your pharmacy*   Lab Work: None If you have labs (blood work) drawn today and your tests are completely normal, you will receive your results only by: [place] (if you have MyChart) OR A paper copy in the mail If you have any lab test that is abnormal or we need to change your treatment, we will call you to review the results.   Testing/Procedures: Your physician has requested that you have an echocardiogram. Echocardiography is a painless test that uses sound waves to create images of your heart. It provides your doctor with information about the size and shape of your heart and how well your heart's chambers and valves are working. This procedure takes approximately one hour. There are no restrictions for this procedure.    Follow-Up: At [facility], you and your health needs are our priority.  As part of our continuing mission to provide you with exceptional heart care, we have created designated Provider Care Teams.  These Care Teams include your primary Cardiologist (physician) and Advanced Practice Providers (APPs -  Physician Assistants and Nurse Practitioners) who all work together to provide you with the care you need, when you need it.  We recommend signing up for the patient portal called "MyChart".  Sign up information is provided on this After Visit Summary.  MyChart is used to connect with patients for Virtual Visits (Telemedicine).  Patients are able to view lab/test results, encounter notes, upcoming appointments, etc.  Non-urgent messages can be sent to your provider as well.   To learn more about what you can do with MyChart, go to [email or web address].    Your next appointment:   4 month(s)  The  format for your next appointment:   In Person  Provider:   Jenne Campus, MD   Other Instructions

## 2020-10-03 NOTE — Addendum Note (Signed)
Addended by: Orvan July on: 10/03/2020 10:59 AM   Modules accepted: Orders

## 2020-10-03 NOTE — Progress Notes (Signed)
Cardiology Office Note:    Date:  10/03/2020   ID:  Rebecca Mccoy, DOB [birth year], MRN [id number]  PCP:  Elby Showers, MD  Cardiologist:  Jenne Campus, MD    Referring MD: Elby Showers, MD   Chief Complaint  Patient presents with   Elevated HR    Ongoing for 4-6 months  I did notice elevated heart rate  History of Present Illness:    Rebecca Mccoy is a 55 y.o. female with past medical history significant for obstructive sleep apnea, dyslipidemia, supraventricular tachycardia.  She comes today to my office for follow-up.  Overall doing well however she noticed increasing heart rate.  Anytime she does any extra effort her heart rate was good speed up.  We did have investigation of this problem before she did for monitor which did not show any significant findings.  She is still trying to be active started going back to gym and doing less exercises but noticed her heart rate speeding up a lot.  That make her concerned.  Described to have some exertional shortness of breath but no swelling of lower extremities no proximal nocturnal dyspnea.  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 08/14/2019   Dyslipidemia 07/03/2018   Fluid retention    GERD (gastroesophageal reflux disease)    GERD (gastroesophageal reflux disease)    Heart murmur    infant   High cholesterol    History of migraine headaches 02/17/2014   Hx of adenomatous polyp of colon 07/09/2016   Sessile serrated polyp 11/17   Hypertension    Hypothyroidism 12/03/2015   Insomnia 07/30/2016   Irregular heart beat    Migraines    Non-allergic rhinitis 06/10/2017   OSA (obstructive sleep apnea) 10/04/2017   PID (pelvic inflammatory disease)    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   Recurrent upper respiratory infection (URI)    April 2019   Sleep apnea    Supraventricular  tachycardia ([place]) 08/22/2018   Thyroid disease    Urticaria     Past Surgical History:  Procedure Laterality Date   AUGMENTATION MAMMAPLASTY Bilateral 2005   BREAST SURGERY  2005   Augmentation   CARPAL TUNNEL RELEASE Bilateral    R in '07 and L '06   COSMETIC SURGERY     leg lift     [place]   tummy tuck  2005    Current Medications: Current Meds  Medication Sig   albuterol (PROVENTIL HFA;VENTOLIN HFA) 108 (90 Base) MCG/ACT inhaler Inhale 1-2 puffs into the lungs every 6 (six) hours as needed for wheezing or shortness of breath.   ALPRAZolam (XANAX) 1 MG tablet Take 1 tablet (1 mg total) by mouth 2 (two) times daily as needed. for anxiety (Patient taking differently: Take 1 mg by mouth 2 (two) times daily as needed for anxiety. for anxiety)   Ascorbic Acid (VITAMIN C PO) Take 1 tablet by mouth daily. Unknown strength   B Complex Vitamins (B COMPLEX 100 PO) Take 1 capsule by mouth daily.   Ergocalciferol (VITAMIN D2) 50 MCG (2000 UT) TABS Take 1 tablet by mouth daily.   EUTHYROX 125 MCG tablet Take 1 tablet by mouth once daily (Patient taking differently: Take 125 mcg by mouth daily before breakfast.)   glucosamine-chondroitin 500-400 MG tablet Take  1 tablet by mouth daily.   metFORMIN (GLUCOPHAGE) 500 MG tablet Take 1 tablet (500 mg total) by mouth 2 (two) times daily.   Multiple Vitamin (MULTIVITAMIN) capsule Take 1 capsule by mouth daily. Unknown strength   Multiple Vitamins-Minerals (ZINC PO) Take 1 tablet by mouth daily. Unknown strength   Omega-3 Fatty Acids (FISH OIL) 1000 MG CAPS Take 2 capsules by mouth daily.   Probiotic Product (PROBIOTIC DAILY PO) Take 1 tablet by mouth daily.   rosuvastatin (CRESTOR) 5 MG tablet TAKE 1 TABLET BY MOUTH THREE TIMES A WEEK (Patient taking differently: Take 5 mg by mouth 3 (three) times a week. TAKE 1 TABLET BY MOUTH THREE TIMES A WEEK)   SYMBICORT 160-4.5 MCG/ACT inhaler INHALE 2 PUFFS INTO THE LUNGS TWICE  DAILY (Patient taking differently: Inhale 2 puffs into the lungs in the morning and at bedtime.)   triamterene-hydrochlorothiazide (MAXZIDE-25) 37.5-25 MG tablet Take 1 tablet by mouth once daily (Patient taking differently: Take 1 tablet by mouth daily.)   TURMERIC PO Take 1 tablet by mouth daily. Unknown strength     Allergies:   Patient has no known allergies.   Social History   Socioeconomic History   Marital status: Married    Spouse name: Not on file   Number of children: 2   Years of education: Not on file   Highest education level: Not on file  Occupational History   Occupation: Operations Support Specialist  Tobacco Use   Smoking status: Never   Smokeless tobacco: Never  Vaping Use   Vaping Use: Never used  Substance and Sexual Activity   Alcohol use: Yes    Alcohol/week: 0.0 - 2.0 standard drinks    Comment: 1-2 beer weekly   Drug use: No   Sexual activity: Yes    Partners: Male    Birth control/protection: Surgical, Post-menopausal    Comment: INTERCOUSE AGE 38, SEXUAL PARTNERS MORE THAN 5  Other Topics Concern   Not on file  Social History Narrative   Not on file   Social Determinants of Health   Financial Resource Strain: Not on file  Food Insecurity: Not on file  Transportation Needs: Not on file  Physical Activity: Not on file  Stress: Not on file  Social Connections: Not on file     Family History: The patient's family history includes Allergic rhinitis in her mother; Colon polyps in her mother; Crohn's disease in her cousin; Diabetes in her maternal grandfather, maternal grandmother, and mother; Hyperlipidemia in her father; Hypertension in her father and mother; Obesity in her mother; Pancreatic cancer in her father; Stroke in her maternal grandmother and paternal grandmother; Thyroid disease in her mother; Uterine cancer in her mother. There is no history of Colon cancer, Stomach cancer, Esophageal cancer, Rectal cancer, Liver cancer, Eczema,  Urticaria, or Asthma. ROS:   Please see the history of present illness.    All 14 point review of systems negative except as described per history of present illness  EKGs/Labs/Other Studies Reviewed:      Recent Labs: 09/05/2020: ALT 13; BUN 11; Creat 0.90; Hemoglobin 10.7; Platelets 381; Potassium 4.6; Sodium 141; TSH 0.82  Recent Lipid Panel    Component Value Date/Time   CHOL 188 09/05/2020 0921   TRIG 125 09/05/2020 0921   HDL 87 09/05/2020 0921   CHOLHDL 2.2 09/05/2020 0921   VLDL 22 07/26/2016 0914   LDLCALC 78 09/05/2020 0921    Physical Exam:    VS:  BP 122/82 (BP Location: Left  Arm, Patient Position: Sitting)   Pulse 84   Ht '5\' 2"'$  (1.575 m)   Wt 171 lb (77.6 kg)   LMP  (LMP Unknown)   SpO2 98%   BMI 31.28 kg/m     Wt Readings from Last 3 Encounters:  10/03/20 171 lb (77.6 kg)  09/09/20 168 lb (76.2 kg)  09/03/20 165 lb (74.8 kg)     GEN:  Well nourished, well developed in no acute distress HEENT: Normal NECK: No JVD; No carotid bruits LYMPHATICS: No lymphadenopathy CARDIAC: RRR, no murmurs, no rubs, no gallops RESPIRATORY:  Clear to auscultation without rales, wheezing or rhonchi  ABDOMEN: Soft, non-tender, non-distended MUSCULOSKELETAL:  No edema; No deformity  SKIN: Warm and dry LOWER EXTREMITIES: no swelling NEUROLOGIC:  Alert and oriented x 3 PSYCHIATRIC:  Normal affect   ASSESSMENT:    1. Supraventricular tachycardia ([place])   2. Primary hypertension   3. OSA (obstructive sleep apnea)   4. Dyslipidemia   5. Prediabetes    PLAN:    In order of problems listed above:  Supraventricular tachycardia.  Stable from that point review denies having arrhythmia.  Off beta-blocker. Essential hypertension blood pressure seems to be well controlled continue present management. Dyslipidemia: I did review her K PN which show me LDL of 78 HDL 87 this is on Crestor 5 which I will continue.  There is a good cholesterol profile we will continue present  management. Sinus tachycardia.  She was identified to be anemic with iron deficient started getting supplementation for it with some improvement I will schedule her to have echocardiogram to make sure that structurally her heart is normal   Medication Adjustments/Labs and Tests Ordered: Current medicines are reviewed at length with the patient today.  Concerns regarding medicines are outlined above.  No orders of the defined types were placed in this encounter.  Medication changes: No orders of the defined types were placed in this encounter.   Signed, Park Liter, MD, [facility] 10/03/2020 10:50 AM    [place]

## 2020-10-08 ENCOUNTER — Other Ambulatory Visit: Payer: Self-pay

## 2020-10-08 ENCOUNTER — Encounter (INDEPENDENT_AMBULATORY_CARE_PROVIDER_SITE_OTHER): Payer: Self-pay | Admitting: Family Medicine

## 2020-10-08 ENCOUNTER — Ambulatory Visit (INDEPENDENT_AMBULATORY_CARE_PROVIDER_SITE_OTHER): Payer: 59 | Admitting: Family Medicine

## 2020-10-08 VITALS — BP 109/76 | HR 81 | Temp 97.8°F | Ht 62.0 in | Wt 165.0 lb

## 2020-10-08 DIAGNOSIS — Z6833 Body mass index (BMI) 33.0-33.9, adult: Secondary | ICD-10-CM

## 2020-10-08 DIAGNOSIS — D508 Other iron deficiency anemias: Secondary | ICD-10-CM

## 2020-10-08 DIAGNOSIS — J45909 Unspecified asthma, uncomplicated: Secondary | ICD-10-CM | POA: Diagnosis not present

## 2020-10-08 DIAGNOSIS — R7301 Impaired fasting glucose: Secondary | ICD-10-CM

## 2020-10-08 DIAGNOSIS — Z9189 Other specified personal risk factors, not elsewhere classified: Secondary | ICD-10-CM | POA: Diagnosis not present

## 2020-10-08 DIAGNOSIS — E669 Obesity, unspecified: Secondary | ICD-10-CM | POA: Diagnosis not present

## 2020-10-08 DIAGNOSIS — E66811 Obesity, class 1: Secondary | ICD-10-CM

## 2020-10-08 MED ORDER — MONTELUKAST SODIUM 10 MG PO TABS: 10.0000 mg | ORAL_TABLET | Freq: Every day | ORAL | 0 refills | Status: DC

## 2020-10-08 MED ORDER — TIRZEPATIDE 2.5 MG/0.5ML ~~LOC~~ SOAJ: 2.5000 mg | SUBCUTANEOUS | 0 refills | Status: DC

## 2020-10-08 NOTE — Progress Notes (Signed)
Chief Complaint:   OBESITY Rebecca Mccoy is here to discuss her progress with her obesity treatment plan along with follow-up of her obesity related diagnoses.   Today's visit was #: 20 Starting weight: 183 lbs Starting date: 04/03/2019 Today's weight: 165 lbs Today's date: 10/08/2020 Weight change since last visit: 0 Total lbs lost to date: 18 lbs Body mass index is 30.18 kg/m.  Total weight loss percentage to date: -9.84%  Current Meal Plan: the Category 1 Plan for 75% of the time.  Current Exercise Plan: Walking for 20-30 minutes 5 times per week.  Interim History:  Rebecca Mccoy saw her PCP and had labs drawn.  Her iron was low, anemia.  She scheduled a colonoscopy.  She has an appointment scheduled with her GYN.  She will see Cardiology for an echo next Friday, but it is a reassuring appointment.    She is using "[employer]" meal prep service.  She says it tastes good.  She has had no [place] cookies for 2 weeks.  She endorses increased asthma symptoms and has had to increase her medications.  AOMs tried in the past have been metformin, phentermine, Vyvanse, Saxenda (nausea), and Wellbutrin.  Assessment/Plan:   1. Extrinsic asthma, associated with allergies Start Singulair 10 mg daily.  Continue other current asthma medications.  - Start montelukast (SINGULAIR) 10 MG tablet; Take 1 tablet (10 mg total) by mouth at bedtime.  Dispense: 30 tablet; Refill: 0  2. Impaired fasting glucose, with polyphagia Not optimized. Current treatment: None. She will continue to focus on protein-rich, low simple carbohydrate foods. We reviewed the importance of hydration, regular exercise for stress reduction, and restorative sleep.  Plan:  Start Mounjaro 2.5 mg subcutaneously weekly, as per below.  Okay to stop metformin.  - Start tirzepatide [facility]) 2.5 MG/0.5ML Pen; Inject 2.5 mg into the skin once a week.  Dispense: 2 mL; Refill: 0  3. Other iron deficiency anemia Rebecca Mccoy is taking ferrous  sulfate 325 mg twice daily.  Recheck with PCP in 2 weeks.   Nutrition: Iron-rich foods include dark leafy greens, red and white meats, eggs, seafood, and beans.  Certain foods and drinks prevent your body from absorbing iron properly. Avoid eating these foods in the same meal as iron-rich foods or with iron supplements. These foods include: coffee, black tea, and red wine; milk, dairy products, and foods that are high in calcium; beans and soybeans; whole grains. Constipation can be a side effect of iron supplementation. Increased water and fiber intake are helpful. Water goal: > 2 liters/day. Fiber goal: > 25 grams/day.  4. At risk for heart disease Due to Rebecca Mccoy's current state of health and medical condition(s), she is at a higher risk for heart disease.  This puts the patient at much greater risk to subsequently develop cardiopulmonary conditions that can significantly affect patient's quality of life in a negative manner.    At least 8 minutes were spent on counseling Rebecca Mccoy about these concerns today. Evidence-based interventions for health behavior change were utilized today including the discussion of self monitoring techniques, problem-solving barriers, and SMART goal setting techniques.  Specifically, regarding patient's less desirable eating habits and patterns, we employed the technique of small changes when Rebecca Mccoy has not been able to fully commit to her prudent nutritional plan.  5. Obesity with current BMI Of 30.2  Course: Rebecca Mccoy is currently in the action stage of change. As such, her goal is to continue with weight loss efforts.   Nutrition goals: She  has agreed to the Category 1 Plan.   Exercise goals:  As is.  Behavioral modification strategies: increasing lean protein intake, decreasing simple carbohydrates, increasing vegetables, and increasing water intake.  Vittoria has agreed to follow-up with our clinic in 4 weeks. She was informed of the importance of frequent follow-up visits  to maximize her success with intensive lifestyle modifications for her multiple health conditions.   Objective:   Blood pressure 109/76, pulse 81, temperature 97.8 F (36.6 C), temperature source Oral, height 5\' 2"  (1.575 m), weight 165 lb (74.8 kg), SpO2 97 %. Body mass index is 30.18 kg/m.  General: Cooperative, alert, well developed, in no acute distress. HEENT: Conjunctivae and lids unremarkable. Cardiovascular: Regular rhythm.  Lungs: Normal work of breathing. Neurologic: No focal deficits.   Lab Results  Component Value Date   CREATININE 0.90 09/05/2020   BUN 11 09/05/2020   NA 141 09/05/2020   K 4.6 09/05/2020   CL 101 09/05/2020   CO2 33 (H) 09/05/2020   Lab Results  Component Value Date   ALT 13 09/05/2020   AST 16 09/05/2020   ALKPHOS 97 07/01/2020   BILITOT 0.5 09/05/2020   Lab Results  Component Value Date   HGBA1C 5.4 09/05/2020   HGBA1C 5.3 12/24/2019   HGBA1C 5.3 06/22/2019   HGBA1C 5.4 01/31/2019   HGBA1C 5.5 08/10/2018   Lab Results  Component Value Date   INSULIN 10.7 04/03/2019   Lab Results  Component Value Date   TSH 0.82 09/05/2020   Lab Results  Component Value Date   CHOL 188 09/05/2020   HDL 87 09/05/2020   LDLCALC 78 09/05/2020   TRIG 125 09/05/2020   CHOLHDL 2.2 09/05/2020   Lab Results  Component Value Date   VD25OH 67.3 05/28/2019   VD25OH 38 06/22/2018   VD25OH 53 05/13/2017   Lab Results  Component Value Date   WBC 4.4 09/05/2020   HGB 10.7 (L) 09/05/2020   HCT 34.9 (L) 09/05/2020   MCV 83.3 09/05/2020   PLT 381 09/05/2020   Lab Results  Component Value Date   IRON 19 (L) 09/05/2020   TIBC 487 (H) 09/05/2020   FERRITIN 9 (L) 09/05/2020   Attestation Statements:   Reviewed by clinician on day of visit: allergies, medications, problem list, medical history, surgical history, family history, social history, and previous encounter notes.  I, Water quality scientist, CMA, am acting as transcriptionist for Briscoe Deutscher,  DO  I have reviewed the above documentation for accuracy and completeness, and I agree with the above. Briscoe Deutscher, DO

## 2020-10-09 ENCOUNTER — Encounter (INDEPENDENT_AMBULATORY_CARE_PROVIDER_SITE_OTHER): Payer: Self-pay | Admitting: Family Medicine

## 2020-10-13 NOTE — Telephone Encounter (Signed)
Prior authorization has been started for Mounjaro. Will notify patient and provider once response is received.  

## 2020-10-14 ENCOUNTER — Encounter (INDEPENDENT_AMBULATORY_CARE_PROVIDER_SITE_OTHER): Payer: Self-pay

## 2020-10-16 ENCOUNTER — Other Ambulatory Visit: Payer: 59 | Admitting: Internal Medicine

## 2020-10-16 ENCOUNTER — Other Ambulatory Visit: Payer: Self-pay

## 2020-10-16 DIAGNOSIS — D509 Iron deficiency anemia, unspecified: Secondary | ICD-10-CM

## 2020-10-17 ENCOUNTER — Ambulatory Visit (HOSPITAL_BASED_OUTPATIENT_CLINIC_OR_DEPARTMENT_OTHER)
Admission: RE | Admit: 2020-10-17 | Discharge: 2020-10-17 | Disposition: A | Discharge status: Home / Self Care | Payer: 59 | Source: Ambulatory Visit | Attending: Cardiology | Admitting: Cardiology

## 2020-10-17 DIAGNOSIS — R0600 Dyspnea, unspecified: Secondary | ICD-10-CM | POA: Diagnosis present

## 2020-10-17 DIAGNOSIS — R0609 Other forms of dyspnea: Secondary | ICD-10-CM

## 2020-10-17 LAB — IRON, TOTAL AND TOTAL IRON BINDING CAPACITY (REFL)
%SAT: 23 % (calc) (ref 16–45)
Iron: 97 ug/dL (ref 45–160)
TIBC: 425 mcg/dL (calc) (ref 250–450)

## 2020-10-20 NOTE — Progress Notes (Signed)
55 y.o. [id number] Married White or Caucasian Not Hispanic or Latino female here for annual exam.  She is wanting hormone testing. Normal TSH.  She hasn't a cycle in 13 years, had her last mirena pulled in the fall of 18. She has some hot flashes at night, tolerable. Low libido, she is sexually active. She has some entry dyspareunia, dry, not using a lubricant. Able to orgasm, harder than it used to be.  She has had trouble with weight loss, working with Dr Juleen China.   No vaginal bleeding.   She has issues with constipation, having a BM every 2-3 days with OTC supplement. On iron for anemia. She has an appointment with GI next week to set up her colonoscopy.     No LMP recorded (lmp unknown). Patient is postmenopausal.          Sexually active: Yes.    The current method of family planning is post menopausal status.    Exercising: yes    Walking  Smoker:  no  Health Maintenance: Pap:  09/26/19 WNL, neg HR HPV; 01/30/16 WNL  History of abnormal Pap:  no MMG:  02/27/20 Density A Bi-rads 1 neg  BMD:   na Colonoscopy: 12/28/15 polyp, follow up in 5 years per Dr Ardis Hughs at [facility]:  12/10/16  Gardasil: n/a   reports that she has never smoked. She has never used smokeless tobacco. She reports current alcohol use. She reports that she does not use drugs. 2-3 drinks a month. 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 She has 2 kids, one granddaughter (36 years old).  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 08/14/2019   Dyslipidemia 07/03/2018   Fluid retention    GERD (gastroesophageal reflux disease)    GERD (gastroesophageal reflux disease)    Heart murmur    infant   High cholesterol    History of migraine headaches 02/17/2014   Hx of adenomatous polyp of colon 07/09/2016   Sessile serrated polyp 11/17    Hypertension    Hypothyroidism 12/03/2015   Insomnia 07/30/2016   Irregular heart beat    Migraines    Non-allergic rhinitis 06/10/2017   OSA (obstructive sleep apnea) 10/04/2017   PID (pelvic inflammatory disease)    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   Recurrent upper respiratory infection (URI)    April 2019   Sleep apnea    Supraventricular tachycardia ([place]) 08/22/2018   Thyroid disease    Urticaria     Past Surgical History:  Procedure Laterality Date   AUGMENTATION MAMMAPLASTY Bilateral 2005   BREAST SURGERY  2005   Augmentation   CARPAL TUNNEL RELEASE Bilateral    R in '07 and L '06   COSMETIC SURGERY     leg lift     [place]   tummy tuck  2005    Current Outpatient Medications  Medication Sig Dispense Refill   albuterol (PROVENTIL HFA;VENTOLIN HFA) 108 (90 Base) MCG/ACT inhaler Inhale 1-2 puffs into the lungs every 6 (six) hours as needed for wheezing or shortness of breath. 1 Inhaler prn   ALPRAZolam (XANAX) 1 MG tablet Take 1 tablet (1 mg total) by mouth 2 (two) times daily as needed. for anxiety (Patient taking differently: Take 1 mg by mouth 2 (two)  times daily as needed for anxiety. for anxiety) 90 tablet 1   Ascorbic Acid (VITAMIN C PO) Take 1 tablet by mouth daily. Unknown strength     ASPIRIN 81 PO Take by mouth.     B Complex Vitamins (B COMPLEX 100 PO) Take 1 capsule by mouth daily.     Ergocalciferol (VITAMIN D2) 50 MCG (2000 UT) TABS Take 1 tablet by mouth daily.     EUTHYROX 125 MCG tablet Take 1 tablet by mouth once daily (Patient taking differently: Take 125 mcg by mouth daily before breakfast.) 90 tablet 0   ferrous sulfate 325 (65 FE) MG tablet Take 325 mg by mouth daily with breakfast.     glucosamine-chondroitin 500-400 MG tablet Take 1 tablet by mouth daily.     montelukast (SINGULAIR) 10 MG tablet Take 1 tablet (10 mg total) by mouth at bedtime. 30 tablet 0   Multiple Vitamin (MULTIVITAMIN)  capsule Take 1 capsule by mouth daily. Unknown strength     Multiple Vitamins-Minerals (ZINC PO) Take 1 tablet by mouth daily. Unknown strength     Omega-3 Fatty Acids (FISH OIL) 1000 MG CAPS Take 2 capsules by mouth daily.     Probiotic Product (PROBIOTIC DAILY PO) Take 1 tablet by mouth daily.     rosuvastatin (CRESTOR) 5 MG tablet TAKE 1 TABLET BY MOUTH THREE TIMES A WEEK (Patient taking differently: Take 5 mg by mouth 3 (three) times a week. TAKE 1 TABLET BY MOUTH THREE TIMES A WEEK) 12 tablet 0   SYMBICORT 160-4.5 MCG/ACT inhaler INHALE 2 PUFFS INTO THE LUNGS TWICE DAILY (Patient taking differently: Inhale 2 puffs into the lungs in the morning and at bedtime.) 10.2 g prn   tirzepatide (MOUNJARO) 2.5 MG/0.5ML Pen Inject 2.5 mg into the skin once a week. 2 mL 0   triamterene-hydrochlorothiazide (MAXZIDE-25) 37.5-25 MG tablet Take 1 tablet by mouth once daily (Patient taking differently: Take 1 tablet by mouth daily.) 90 tablet 0   TURMERIC PO Take 1 tablet by mouth daily. Unknown strength     No current facility-administered medications for this visit.    Family History  Problem Relation Age of Onset   Diabetes Mother    Uterine cancer Mother    Colon polyps Mother    Allergic rhinitis Mother    Hypertension Mother    Thyroid disease Mother    Obesity Mother    Pancreatic cancer Father    Hyperlipidemia Father    Hypertension Father    Crohn's disease Cousin    Diabetes Maternal Grandmother    Stroke Maternal Grandmother    Diabetes Maternal Grandfather    Stroke Paternal Grandmother    Colon cancer Neg Hx    Stomach cancer Neg Hx    Esophageal cancer Neg Hx    Rectal cancer Neg Hx    Liver cancer Neg Hx    Eczema Neg Hx    Urticaria Neg Hx    Asthma Neg Hx     Review of Systems  All other systems reviewed and are negative.  Exam:   BP 122/82   Pulse 88   Ht 5\' 2"  (1.575 m)   Wt 172 lb (78 kg)   LMP  (LMP Unknown)   SpO2 99%   BMI 31.46 kg/m   Weight change:  @WEIGHTCHANGE @ Height:   Height: 5\' 2"  (157.5 cm)  Ht Readings from Last 3 Encounters:  10/22/20 5\' 2"  (1.575 m)  10/21/20 5\' 2"  (1.575 m)  10/08/20 5\' 2"  (1.575  m)    General appearance: alert, cooperative and appears stated age Head: Normocephalic, without obvious abnormality, atraumatic Neck: no adenopathy, supple, symmetrical, trachea midline and thyroid normal to inspection and palpation Lungs: clear to auscultation bilaterally Cardiovascular: regular rate and rhythm Breasts: normal appearance, no masses or tenderness, bilateral soft implants. Abdomen: soft, non-tender; non distended,  no masses,  no organomegaly Extremities: extremities normal, atraumatic, no cyanosis or edema Skin: Skin color, texture, turgor normal. No rashes or lesions Lymph nodes: Cervical, supraclavicular, and axillary nodes normal. No abnormal inguinal nodes palpated Neurologic: Grossly normal   Pelvic: External genitalia:  no lesions              Urethra:  normal appearing urethra with no masses, tenderness or lesions              Bartholins and Skenes: normal                 Vagina: mildly atrophic appearing vagina with normal color and discharge, no lesions              Cervix: no lesions               Bimanual Exam:  Uterus:  normal size, contour, position, consistency, mobility, non-tender              Adnexa: no mass, fullness, tenderness               Rectovaginal: Confirms               Anus:  normal sphincter tone, no lesions  Gae Dry chaperoned for the exam.  1. Well woman exam Discussed breast self exam Discussed calcium and vit D intake No pap this year Mammogram in 2/23 Colonoscopy in the next few months  2. Low libido Information given - Testos,Total,Free and SHBG (Female) -Discussed the option of compounded testosterone, risks reviewed. If she starts it she will need a f/u testosterone panel in one month  3. Dyspareunia in female Use a lubricant  4. Vaginal atrophy If  the lubricant doesn't work we discussed the option of vaginal estrogen  5. BMI 31.0-31.9,adult Followed at the weight loss clinic.

## 2020-10-21 ENCOUNTER — Encounter: Payer: Self-pay | Admitting: Internal Medicine

## 2020-10-21 ENCOUNTER — Other Ambulatory Visit: Payer: Self-pay

## 2020-10-21 ENCOUNTER — Ambulatory Visit (INDEPENDENT_AMBULATORY_CARE_PROVIDER_SITE_OTHER): Payer: 59 | Admitting: Internal Medicine

## 2020-10-21 VITALS — BP 112/76 | HR 65 | Temp 98.0°F | Ht 62.0 in | Wt 172.0 lb

## 2020-10-21 DIAGNOSIS — D509 Iron deficiency anemia, unspecified: Secondary | ICD-10-CM | POA: Diagnosis not present

## 2020-10-21 DIAGNOSIS — I10 Essential (primary) hypertension: Secondary | ICD-10-CM

## 2020-10-21 LAB — ECHOCARDIOGRAM COMPLETE
AR max vel: 2.55 cm2
AV Area VTI: 2.6 cm2
AV Area mean vel: 2.17 cm2
AV Mean grad: 3 mmHg
AV Peak grad: 4.6 mmHg
Ao pk vel: 1.07 m/s
Area-P 1/2: 4.39 cm2
Calc EF: 51 %
S' Lateral: 3.3 cm
Single Plane A2C EF: 58.2 %
Single Plane A4C EF: 41.5 %

## 2020-10-21 LAB — CBC WITH DIFFERENTIAL/PLATELET
Absolute Monocytes: 354 cells/uL (ref 200–950)
Basophils Absolute: 41 cells/uL (ref 0–200)
Basophils Relative: 0.9 %
Eosinophils Absolute: 51 cells/uL (ref 15–500)
Eosinophils Relative: 1.1 %
HCT: 41.7 % (ref 35.0–45.0)
Hemoglobin: 13.4 g/dL (ref 11.7–15.5)
Lymphs Abs: 1891 cells/uL (ref 850–3900)
MCH: 28.3 pg (ref 27.0–33.0)
MCHC: 32.1 g/dL (ref 32.0–36.0)
MCV: 88 fL (ref 80.0–100.0)
MPV: 9.6 fL (ref 7.5–12.5)
Monocytes Relative: 7.7 %
Neutro Abs: 2263 cells/uL (ref 1500–7800)
Neutrophils Relative %: 49.2 %
Platelets: 333 10*3/uL (ref 140–400)
RBC: 4.74 10*6/uL (ref 3.80–5.10)
RDW: 18.4 % — ABNORMAL HIGH (ref 11.0–15.0)
Total Lymphocyte: 41.1 %
WBC: 4.6 10*3/uL (ref 3.8–10.8)

## 2020-10-21 NOTE — Progress Notes (Signed)
   Subjective:    Patient ID: Rebecca Mccoy, female    DOB: September 09, 1965, 55 y.o.   MRN: [id number]  HPI 55 year old Female for 6-week follow up.  In August, was found to have iron deficiency anemia.  Total iron was 19 and ferritin was 9.  Hemoglobin was 10.7 g and MCV was 83.3.  She was started on iron supplement.  She had undergone colonoscopy in 2017 by Dr. Ardis Hughs with repeat study due December 2022.  She will seeing Dr. Ardis Hughs on October 11.  Continues [facility] be [facility] at [facility].  After being placed on iron supplementation her hemoglobin is now 13.4 g.  MCV normal at 88 improved from 83.3  Saw Dr. Talbert Nan October 5 for GYN exam.  Was placed on testosterone for low libido.  History of sleep apnea, dyslipidemia, SVT.  Saw cardiologist, Dr. Agustin Cree in September.  It is possible an iron deficiency anemia was contributing to increased heart rate.  Review of Systems Has appt soon with Dr. Ardis Hughs for consultation next week     Objective:   Physical Exam Vital signs reviewed.  No thyromegaly.  Chest clear.  Cardiac exam regular rate and rhythm.  No lower extremity pitting edema.  No thyromegaly.       Assessment & Plan:    Iron deficiency anemia:  Referral to Gastroenterology regarding iron deficiency.  Hemoglobin has improved from 10.7 g in August 2022  to 13 .4 g with iron supplementation which is encouraging.   History of SVT-seen by cardiologist.  Has follow-up appointment in January -has been scheduled for echocardiogram.  Not on beta-blocker at present time.  Initially referred to cardiologist in 2020 with substernal chest pain.  EKG at that time showed no acute changes.  Chest pain was nonexertional.  Had negative exercise tolerance test in 2018 for chest pain.  History of syncope in 2008 referred for echocardiogram and event monitor.  History of palpitations.  History of sleep apnea  Essential hypertension treated with low-dose losartan  BMI 31.46-continue with [facility]  Health maintenance exam due August 2023.  We will see Dr. Talbert Nan for GYN exam October 5.  An IUD inserted in 2008.  History of menorrhagia due to fibroids in 2006.  Impaired glucose tolerance treated with Mounjaro.  Hemoglobin A1c 5.4% in August  History of hyperlipidemia.  Unable to tolerate Crestor 3 times a week because of myalgias.  Addendum: Scheduled for colonoscopy in November

## 2020-10-22 ENCOUNTER — Encounter: Payer: Self-pay | Admitting: Obstetrics and Gynecology

## 2020-10-22 ENCOUNTER — Ambulatory Visit (INDEPENDENT_AMBULATORY_CARE_PROVIDER_SITE_OTHER): Payer: 59 | Admitting: Obstetrics and Gynecology

## 2020-10-22 VITALS — BP 122/82 | HR 88 | Ht 62.0 in | Wt 172.0 lb

## 2020-10-22 DIAGNOSIS — N952 Postmenopausal atrophic vaginitis: Secondary | ICD-10-CM

## 2020-10-22 DIAGNOSIS — N941 Unspecified dyspareunia: Secondary | ICD-10-CM

## 2020-10-22 DIAGNOSIS — R6882 Decreased libido: Secondary | ICD-10-CM | POA: Diagnosis not present

## 2020-10-22 DIAGNOSIS — Z01419 Encounter for gynecological examination (general) (routine) without abnormal findings: Secondary | ICD-10-CM | POA: Diagnosis not present

## 2020-10-22 DIAGNOSIS — Z6831 Body mass index (BMI) 31.0-31.9, adult: Secondary | ICD-10-CM

## 2020-10-22 NOTE — Patient Instructions (Addendum)
Try uberlube for vaginal lubrication.   Atrophic Vaginitis Atrophic vaginitis is a condition in which the tissues that line the vagina become dry and thin. This condition is most common in women who have stopped having regular menstrual periods (are in menopause). This usually starts when a woman is 42 to 55 years old. That is the time when a woman's estrogen levels begin to decrease. Estrogen is a female hormone. It helps to keep the tissues of the vagina moist. It stimulates the vagina to produce a clear fluid that lubricates the vagina for sex. This fluid also protects the vagina from infection. Lack of estrogen can cause the lining of the vagina to get thinner and dryer. The vagina may also shrink in size. It may become less elastic. Atrophic vaginitis tends to get worse over time as a woman's estrogen level drops. What are the causes? This condition is caused by the normal drop in estrogen that happens around the time of menopause. What increases the risk? Certain conditions or situations may lower a woman's estrogen level, leading to a higher risk for atrophic vaginitis. You are more likely to develop this condition if: You are taking medicines that block estrogen. You have had your ovaries removed. You are being treated for cancer with radiation or medicines (chemotherapy). You have given birth or are breastfeeding. You are older than age 71. You smoke. What are the signs or symptoms? Symptoms of this condition include: Pain, soreness, a feeling of pressure, or bleeding during sex (dyspareunia). Vaginal burning, irritation, or itching. Pain or bleeding when a speculum is used in a vaginal exam. Having burning pain while urinating. Vaginal discharge. In some cases, there are no symptoms. How is this diagnosed? This condition is diagnosed based on your medical history and a physical exam. This will include a pelvic exam that checks the vaginal tissues. Though rare, you may also have  other tests, including: A urine test. A test that checks the acid balance in your vagina (acid balance test). How is this treated? Treatment for this condition depends on how severe your symptoms are. Treatment may include: Using an over-the-counter vaginal lubricant before sex. Using a long-acting vaginal moisturizer. Using low-dose estrogen for moderate to severe symptoms that do not respond to other treatments. Options include creams, tablets, and inserts (vaginal rings). Before you use a vaginal estrogen, tell your health care provider if you have a history of: Breast cancer. Endometrial cancer. Blood clots. If you are not sexually active and your symptoms are very mild, you may not need treatment. Follow these instructions at home: Medicines Take over-the-counter and prescription medicines only as told by your health care provider. Do not use herbal or alternative medicines unless your health care provider says that you can. Use over-the-counter creams, lubricants, or moisturizers for dryness only as told by your health care provider. General instructions If your atrophic vaginitis is caused by menopause, discuss all of your menopause symptoms and treatment options with your health care provider. Do not douche. Do not use products that can make your vagina dry. These include: Scented feminine sprays. Scented tampons. Scented soaps. Vaginal sex can help to improve blood flow and elasticity of vaginal tissue. If you choose to have sex and it hurts, try using a water-soluble lubricant or moisturizer right before having sex. Contact a health care provider if: Your discharge looks different than normal. Your vagina has an unusual smell. You have new symptoms. Your symptoms do not improve with treatment. Your symptoms get worse.  Summary Atrophic vaginitis is a condition in which the tissues that line the vagina become dry and thin. It is most common in women who have stopped having  regular menstrual periods (are in menopause). Treatment options include using vaginal lubricants and low-dose vaginal estrogen. Contact a health care provider if your vagina has an unusual smell, or if your symptoms get worse or do not improve after treatment. This information is not intended to replace advice given to you by your health care provider. Make sure you discuss any questions you have with your health care provider. Document Revised: 07/05/2019 Document Reviewed: 07/05/2019 Elsevier Patient Education  [place]   We recommended that you start or continue a regular exercise program for good health. Physical activity is anything that gets your body moving, some is better than none. The CDC recommends 150 minutes per week of Moderate-Intensity Aerobic Activity and 2 or more days of Muscle Strengthening Activity.  Benefits of exercise are limitless: helps weight loss/weight maintenance, improves mood and energy, helps with depression and anxiety, improves sleep, tones and strengthens muscles, improves balance, improves bone density, protects from chronic conditions such as heart disease, high blood pressure and diabetes and so much more. To learn more visit: WhyNotPoker.uy  DIET: Good nutrition starts with a healthy diet of fruits, vegetables, whole grains, and lean protein sources. Drink plenty of water for hydration. Minimize empty calories, sodium, sweets. For more information about dietary recommendations visit: GeekRegister.com.ee and http://schaefer-mitchell.com/  ALCOHOL:  Women should limit their alcohol intake to no more than 7 drinks/beers/glasses of wine (combined, not each!) per week. Moderation of alcohol intake to this level decreases your risk of breast cancer and liver damage.  If you are concerned that you may have a problem, or your friends have told you they are concerned  about your drinking, there are many resources to help. A well-known program that is free, effective, and available to all people all over the nation is Alcoholics Anonymous.  Check out this site to learn more: BlockTaxes.se   CALCIUM AND VITAMIN D:  Adequate intake of calcium and Vitamin D are recommended for bone health.  You should be getting between 1000-1200 mg of calcium and 800 units of Vitamin D daily between diet and supplements  PAP SMEARS:  Pap smears, to check for cervical cancer or precancers,  have traditionally been done yearly, scientific advances have shown that most women can have pap smears less often.  However, every woman still should have a physical exam from her gynecologist every year. It will include a breast check, inspection of the vulva and vagina to check for abnormal growths or skin changes, a visual exam of the cervix, and then an exam to evaluate the size and shape of the uterus and ovaries. We will also provide age appropriate advice regarding health maintenance, like when you should have certain vaccines, screening for sexually transmitted diseases, bone density testing, colonoscopy, mammograms, etc.   MAMMOGRAMS:  All women over 65 years old should have a routine mammogram.   COLON CANCER SCREENING: Now recommend starting at age 46. At this time colonoscopy is not covered for routine screening until 50. There are take home tests that can be done between 45-49.   COLONOSCOPY:  Colonoscopy to screen for colon cancer is recommended for all women at age 88.  We know, you hate the idea of the prep.  We agree, BUT, having colon cancer and not knowing it is worse!!  Colon cancer so  often starts as a polyp that can be seen and removed at colonscopy, which can quite literally save your life!  And if your first colonoscopy is normal and you have no family history of colon cancer, most women don't have to have it again for 10 years.  Once every ten years, you can do something  that may end up saving your life, right?  We will be happy to help you get it scheduled when you are ready.  Be sure to check your insurance coverage so you understand how much it will cost.  It may be covered as a preventative service at no cost, but you should check your particular policy.      Breast Self-Awareness Breast self-awareness means being familiar with how your breasts look and feel. It involves checking your breasts regularly and reporting any changes to your health care provider. Practicing breast self-awareness is important. A change in your breasts can be a sign of a serious medical problem. Being familiar with how your breasts look and feel allows you to find any problems early, when treatment is more likely to be successful. All women should practice breast self-awareness, including women who have had breast implants. How to do a breast self-exam One way to learn what is normal for your breasts and whether your breasts are changing is to do a breast self-exam. To do a breast self-exam: Look for Changes  Remove all the clothing above your waist. Stand in front of a mirror in a room with good lighting. Put your hands on your hips. Push your hands firmly downward. Compare your breasts in the mirror. Look for differences between them (asymmetry), such as: Differences in shape. Differences in size. Puckers, dips, and bumps in one breast and not the other. Look at each breast for changes in your skin, such as: Redness. Scaly areas. Look for changes in your nipples, such as: Discharge. Bleeding. Dimpling. Redness. A change in position. Feel for Changes Carefully feel your breasts for lumps and changes. It is best to do this while lying on your back on the floor and again while sitting or standing in the shower or tub with soapy water on your skin. Feel each breast in the following way: Place the arm on the side of the breast you are examining above your head. Feel your  breast with the other hand. Start in the nipple area and make  inch (2 cm) overlapping circles to feel your breast. Use the pads of your three middle fingers to do this. Apply light pressure, then medium pressure, then firm pressure. The light pressure will allow you to feel the tissue closest to the skin. The medium pressure will allow you to feel the tissue that is a little deeper. The firm pressure will allow you to feel the tissue close to the ribs. Continue the overlapping circles, moving downward over the breast until you feel your ribs below your breast. Move one finger-width toward the center of the body. Continue to use the  inch (2 cm) overlapping circles to feel your breast as you move slowly up toward your collarbone. Continue the up and down exam using all three pressures until you reach your armpit.  Write Down What You Find  Write down what is normal for each breast and any changes that you find. Keep a written record with breast changes or normal findings for each breast. By writing this information down, you do not need to depend only on memory for size, tenderness,  or location. Write down where you are in your menstrual cycle, if you are still menstruating. If you are having trouble noticing differences in your breasts, do not get discouraged. With time you will become more familiar with the variations in your breasts and more comfortable with the exam. How often should I examine my breasts? Examine your breasts every month. If you are breastfeeding, the best time to examine your breasts is after a feeding or after using a breast pump. If you menstruate, the best time to examine your breasts is 5-7 days after your period is over. During your period, your breasts are lumpier, and it may be more difficult to notice changes. When should I see my health care provider? See your health care provider if you notice: A change in shape or size of your breasts or nipples. A change in the skin  of your breast or nipples, such as a reddened or scaly area. Unusual discharge from your nipples. A lump or thick area that was not there before. Pain in your breasts. Anything that concerns you.

## 2020-10-23 ENCOUNTER — Telehealth: Payer: Self-pay | Admitting: Emergency Medicine

## 2020-10-23 DIAGNOSIS — Z79899 Other long term (current) drug therapy: Secondary | ICD-10-CM

## 2020-10-23 MED ORDER — LOSARTAN POTASSIUM 25 MG PO TABS: 25.0000 mg | ORAL_TABLET | Freq: Every day | ORAL | 1 refills | Status: DC

## 2020-10-23 NOTE — Telephone Encounter (Signed)
-----   Message from Park Liter, MD sent at 10/23/2020  1:59 PM EDT ----- Echocardiogram showed mildly diminished left ventricle ejection fraction, 45 to 50%.  Everything else looks normal.  Lets start losartan 25 mg daily.  He need to have Chem-7 done in about a week

## 2020-10-23 NOTE — Telephone Encounter (Signed)
Called patient informed her of results. She will start losartan 25 mg daily. She will  have labs drawn in 1 week.

## 2020-10-25 LAB — TESTOS,TOTAL,FREE AND SHBG (FEMALE)
Free Testosterone: 3 pg/mL (ref 0.1–6.4)
Sex Hormone Binding: 32 nmol/L (ref 17–124)
Testosterone, Total, LC-MS-MS: 25 ng/dL (ref 2–45)

## 2020-10-28 ENCOUNTER — Other Ambulatory Visit: Payer: 59

## 2020-10-28 ENCOUNTER — Encounter: Payer: Self-pay | Admitting: Gastroenterology

## 2020-10-28 ENCOUNTER — Ambulatory Visit: Payer: 59 | Admitting: Gastroenterology

## 2020-10-28 VITALS — BP 128/70 | HR 88 | Ht 62.0 in | Wt 172.4 lb

## 2020-10-28 DIAGNOSIS — D509 Iron deficiency anemia, unspecified: Secondary | ICD-10-CM

## 2020-10-28 MED ORDER — NA SULFATE-K SULFATE-MG SULF 17.5-3.13-1.6 GM/177ML PO SOLN: 1.0000 | ORAL | 0 refills | Status: DC

## 2020-10-28 NOTE — Patient Instructions (Signed)
If you are age 55 or younger, your body mass index should be between 19-25. Your Body mass index is 31.53 kg/m. If this is out of the aformentioned range listed, please consider follow up with your Primary Care Provider.  __________________________________________________________  The [facility] providers would like to encourage you to use [facility] to communicate with providers for non-urgent requests or questions.  Due to long hold times on the telephone, sending your provider a message by [facility] may be a faster and more efficient way to get a response.  Please allow 48 business hours for a response.  Please remember that this is for non-urgent requests.   Your provider has requested that you go to the basement level for lab work before leaving today. Press "B" on the elevator. The lab is located at the first door on the left as you exit the elevator.  You have been scheduled for an endoscopy and colonoscopy. Please follow the written instructions given to you at your visit today. Please pick up your prep supplies at the pharmacy within the next 1-3 days. If you use inhalers (even only as needed), please bring them with you on the day of your procedure.  Please continue taking Iron.  Due to recent changes in healthcare laws, you may see the results of your imaging and laboratory studies on MyChart before your provider has had a chance to review them.  We understand that in some cases there may be results that are confusing or concerning to you. Not all laboratory results come back in the same time frame and the provider may be waiting for multiple results in order to interpret others.  Please give [place] 48 hours in order for your provider to thoroughly review all the results before contacting the office for clarification of your results.   Thank you for entrusting me with your care and choosing [facility].  Dr Ardis Hughs

## 2020-10-28 NOTE — Progress Notes (Signed)
Review of pertinent gastrointestinal problems: 1. History of precancerous polyps: colonoscopy in December 2017 with finding of a 6 mm polyp in the ascending colon and an otherwise normal exam. Biopsy showed a sessile serrated polyp no dysplasia and she is indicated for 5 year interval follow-up. 2. LLQ pains 2017: CT of the abdomen and pelvis in November 2017 which showed a few scattered small diverticuli, and IUD, and a 1.6 x 1.6 cm stone in the upper pole of the left kidney, no hydronephrosis. 3.  Diarrhea, abdominal pain 2018 testing including GI pathogen panel, CBC, complete metabolic profile were normal.  Sed rate was slightly elevated.  Propanol significantly improved her symptoms.  She was gaining weight.  HPI: This is a very pleasant 55 year old woman who was referred to me by Elby Showers, MD  to evaluate iron deficiency anemia.      I last saw her about 4 years ago.  She is here today for a new problem.  Her primary care physician noticed she was becoming anemic and iron deficient.  Hemoglobin June 2021 was 12.9.  Hemoglobin 08/2020 was 10.7, MCV 83.3.  Normal platelets.  She was found to be Hemoccult negative on testing.  Her ferritin was 9, her total iron was 19, her TIBC was 487.  Complete metabolic profile was normal.  She has had no overt bleeding of any kind.  No black stools, no bloody stools.  No vaginal bleeding, no nosebleeding.  She has had intermittent left upper quadrant spasms which last for about 2 minutes and then are gone this occurs once or twice a week.  Her stools are rather solid, she does not have diarrhea.  His weight is going up overall.  A cousin of hers has Crohn's  Colon cancer does not run in her family   Review of systems: Pertinent positive and negative review of systems were noted in the above HPI section. All other review negative.   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 08/14/2019   Dyslipidemia 07/03/2018   Fluid retention    GERD (gastroesophageal reflux disease)    GERD (gastroesophageal reflux disease)    Heart murmur    infant   High cholesterol    History of migraine headaches 02/17/2014   Hx of adenomatous polyp of colon 07/09/2016   Sessile serrated polyp 11/17   Hypertension    Hypothyroidism 12/03/2015   Insomnia 07/30/2016   Irregular heart beat    Migraines    Non-allergic rhinitis 06/10/2017   OSA (obstructive sleep apnea) 10/04/2017   PID (pelvic inflammatory disease)    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   Recurrent upper respiratory infection (URI)    April 2019   Sleep apnea    Supraventricular tachycardia ([place]) 08/22/2018   Thyroid disease    Urticaria     Past Surgical History:  Procedure Laterality Date   AUGMENTATION MAMMAPLASTY Bilateral 2005   BREAST SURGERY  2005   Augmentation   CARPAL TUNNEL RELEASE Bilateral    R in '07 and L '06   COLONOSCOPY  2017   COSMETIC SURGERY     leg lift     TONSILLECTOMY  1972   TUBAL LIGATION  1989   tummy tuck  2005    Current Outpatient Medications  Medication Sig Dispense Refill   albuterol (  PROVENTIL HFA;VENTOLIN HFA) 108 (90 Base) MCG/ACT inhaler Inhale 1-2 puffs into the lungs every 6 (six) hours as needed for wheezing or shortness of breath. 1 Inhaler prn   ALPRAZolam (XANAX) 1 MG tablet Take 1 tablet (1 mg total) by mouth 2 (two) times daily as needed. for anxiety (Patient taking differently: Take 1 mg by mouth 2 (two) times daily as needed for anxiety. for anxiety) 90 tablet 1   Ascorbic Acid (VITAMIN C PO) Take 1 tablet by mouth daily. Unknown strength     ASPIRIN 81 PO Take by mouth.     B Complex Vitamins (B COMPLEX 100 PO) Take 1 capsule by mouth daily.     Ergocalciferol (VITAMIN D2) 50 MCG (2000 UT) TABS Take 1 tablet by mouth daily.     EUTHYROX 125 MCG tablet Take 1  tablet by mouth once daily (Patient taking differently: Take 125 mcg by mouth daily before breakfast.) 90 tablet 0   ferrous sulfate 325 (65 FE) MG tablet Take 325 mg by mouth 2 (two) times daily with a meal.     glucosamine-chondroitin 500-400 MG tablet Take 1 tablet by mouth daily.     losartan (COZAAR) 25 MG tablet Take 1 tablet (25 mg total) by mouth daily. 90 tablet 1   montelukast (SINGULAIR) 10 MG tablet Take 1 tablet (10 mg total) by mouth at bedtime. 30 tablet 0   Multiple Vitamin (MULTIVITAMIN) capsule Take 1 capsule by mouth daily. Unknown strength     Multiple Vitamins-Minerals (ZINC PO) Take 1 tablet by mouth daily. Unknown strength     Omega-3 Fatty Acids (FISH OIL) 1000 MG CAPS Take 2 capsules by mouth daily.     Probiotic Product (PROBIOTIC DAILY PO) Take 1 tablet by mouth daily.     rosuvastatin (CRESTOR) 5 MG tablet TAKE 1 TABLET BY MOUTH THREE TIMES A WEEK (Patient taking differently: Take 5 mg by mouth 3 (three) times a week. TAKE 1 TABLET BY MOUTH THREE TIMES A WEEK) 12 tablet 0   SYMBICORT 160-4.5 MCG/ACT inhaler INHALE 2 PUFFS INTO THE LUNGS TWICE DAILY (Patient taking differently: Inhale 2 puffs into the lungs in the morning and at bedtime.) 10.2 g prn   tirzepatide (MOUNJARO) 2.5 MG/0.5ML Pen Inject 2.5 mg into the skin once a week. 2 mL 0   triamterene-hydrochlorothiazide (MAXZIDE-25) 37.5-25 MG tablet Take 1 tablet by mouth once daily (Patient taking differently: Take 1 tablet by mouth daily.) 90 tablet 0   TURMERIC PO Take 1 tablet by mouth daily. Unknown strength     No current facility-administered medications for this visit.    Allergies as of 10/28/2020   (No Known Allergies)    Family History  Problem Relation Age of Onset   Diabetes Mother    Uterine cancer Mother    Colon polyps Mother    Allergic rhinitis Mother    Hypertension Mother    Thyroid disease Mother    Obesity Mother    Pancreatic cancer Father    Hyperlipidemia Father    Hypertension  Father    Crohn's disease Cousin    Diabetes Maternal Grandmother    Stroke Maternal Grandmother    Diabetes Maternal Grandfather    Stroke Paternal Grandmother    Colon cancer Neg Hx    Stomach cancer Neg Hx    Esophageal cancer Neg Hx    Rectal cancer Neg Hx    Liver cancer Neg Hx    Eczema Neg Hx    Urticaria Neg Hx  Asthma Neg Hx     Social History   Socioeconomic History   Marital status: Married    Spouse name: Not on file   Number of children: 2   Years of education: Not on file   Highest education level: Not on file  Occupational History   Occupation: Operations Support Specialist  Tobacco Use   Smoking status: Never   Smokeless tobacco: Never  Vaping Use   Vaping Use: Never used  Substance and Sexual Activity   Alcohol use: Yes    Alcohol/week: 0.0 - 2.0 standard drinks    Comment: 1-2 beer weekly   Drug use: No   Sexual activity: Yes    Partners: Male    Birth control/protection: Surgical, Post-menopausal    Comment: INTERCOUSE AGE 79, SEXUAL PARTNERS MORE THAN 5  Other Topics Concern   Not on file  Social History Narrative   Not on file   Social Determinants of Health   Financial Resource Strain: Not on file  Food Insecurity: Not on file  Transportation Needs: Not on file  Physical Activity: Not on file  Stress: Not on file  Social Connections: Not on file  Intimate Partner Violence: Not on file     Physical Exam: BP 128/70   Pulse 88   Ht 5\' 2"  (1.575 m)   Wt 172 lb 6.4 oz (78.2 kg)   LMP  (LMP Unknown)   SpO2 98%   BMI 31.53 kg/m  Constitutional: generally well-appearing Psychiatric: alert and oriented x3 Eyes: extraocular movements intact Mouth: oral pharynx moist, no lesions Neck: supple no lymphadenopathy Cardiovascular: heart regular rate and rhythm Lungs: clear to auscultation bilaterally Abdomen: soft, nontender, nondistended, no obvious ascites, no peritoneal signs, normal bowel sounds Extremities: no lower extremity  edema bilaterally Skin: no lesions on visible extremities   Assessment and plan: 55 y.o. female with new iron deficiency anemia  She was Hemoccult negative on testing but I think we still need to better evaluate her GI tract.  She is going to have serology testing for celiac sprue.  I suspect that this will be negative but certainly if she does have celiac sprue that could explain her iron deficiency anemia.  She has a history of precancerous colon polyps and is due anyway for repeat colonoscopy in December of this year.  I think given her iron deficiency anemia we should also proceed with EGD at the same time.  I see no reason for any further blood tests or imaging studies other than that described above.  Please see the "Patient Instructions" section for addition details about the plan.   Owens Loffler, MD [facility] 10/28/2020, 8:43 AM  Cc: Elby Showers, MD  Total time on date of encounter was 45 minutes (this included time spent preparing to see the patient reviewing records; obtaining and/or reviewing separately obtained history; performing a medically appropriate exam and/or evaluation; counseling and educating the patient and family if present; ordering medications, tests or procedures if applicable; and documenting clinical information in the health record).

## 2020-10-28 NOTE — Addendum Note (Signed)
Addended by: Isaiah Serge D on: 10/28/2020 09:09 AM   Modules accepted: Orders

## 2020-10-29 LAB — IGA: Immunoglobulin A: 179 mg/dL (ref 47–310)

## 2020-10-29 LAB — TISSUE TRANSGLUTAMINASE, IGA: (tTG) Ab, IgA: <1 U/mL

## 2020-11-05 LAB — BASIC METABOLIC PANEL
BUN/Creatinine Ratio: 11 (ref 9–23)
BUN: 10 mg/dL (ref 6–24)
CO2: 23 mmol/L (ref 20–29)
Calcium: 10.1 mg/dL (ref 8.7–10.2)
Chloride: 101 mmol/L (ref 96–106)
Creatinine, Ser: 0.94 mg/dL (ref 0.57–1.00)
Glucose: 92 mg/dL (ref 70–99)
Potassium: 4.5 mmol/L (ref 3.5–5.2)
Sodium: 141 mmol/L (ref 134–144)
eGFR: 72 mL/min/{1.73_m2} (ref >59)

## 2020-11-06 ENCOUNTER — Other Ambulatory Visit: Payer: Self-pay | Admitting: Internal Medicine

## 2020-11-11 ENCOUNTER — Encounter (INDEPENDENT_AMBULATORY_CARE_PROVIDER_SITE_OTHER): Payer: Self-pay | Admitting: Family Medicine

## 2020-11-11 ENCOUNTER — Other Ambulatory Visit: Payer: Self-pay

## 2020-11-11 ENCOUNTER — Ambulatory Visit (INDEPENDENT_AMBULATORY_CARE_PROVIDER_SITE_OTHER): Payer: 59 | Admitting: Family Medicine

## 2020-11-11 VITALS — BP 111/81 | HR 75 | Temp 97.9°F | Ht 62.0 in | Wt 164.0 lb

## 2020-11-11 DIAGNOSIS — F439 Reaction to severe stress, unspecified: Secondary | ICD-10-CM | POA: Diagnosis not present

## 2020-11-11 DIAGNOSIS — E669 Obesity, unspecified: Secondary | ICD-10-CM | POA: Diagnosis not present

## 2020-11-11 DIAGNOSIS — D508 Other iron deficiency anemias: Secondary | ICD-10-CM | POA: Diagnosis not present

## 2020-11-11 DIAGNOSIS — R7301 Impaired fasting glucose: Secondary | ICD-10-CM

## 2020-11-11 DIAGNOSIS — Z6833 Body mass index (BMI) 33.0-33.9, adult: Secondary | ICD-10-CM

## 2020-11-11 MED ORDER — TIRZEPATIDE 2.5 MG/0.5ML ~~LOC~~ SOAJ
2.5000 mg | SUBCUTANEOUS | 0 refills | Status: DC
Start: 2020-11-11 — End: 2020-12-16

## 2020-11-13 NOTE — Progress Notes (Signed)
Chief Complaint:   OBESITY Rebecca Mccoy is here to discuss her progress with her obesity treatment plan along with follow-up of her obesity related diagnoses. See Medical Weight Management Flowsheet for complete bioelectrical impedance results.  Today's visit was #: 21 Starting weight: 183 lbs Starting date: 04/03/2019 Weight change since last visit: 1 lb Total lbs lost to date: 19 lbs Total weight loss percentage to date: -10.38%  Nutrition Plan: Category 1 Plan for 90% of the time. Activity: Increased walking. Anti-obesity medications: Mounjaro 2.5 mg subcutaneously weekly. Reported side effects: None.  Interim History: Rebecca Mccoy says she feels Rebecca Mccoy is helpful.  She got to 170 pounds prior to starting [facility].  She reports being under increased stress due to problems in her extended family.  Assessment/Plan:   1. Impaired fasting glucose, with polyphagia Controlled. Current treatment: Mounjaro 2.5 mg subcutaneously weekly.    Plan:  Refill Mounjaro 2.5 mg subcutaneously weekly, as per below.  She will continue to focus on protein-rich, low simple carbohydrate foods. We reviewed the importance of hydration, regular exercise for stress reduction, and restorative sleep.  - Refill tirzepatide (MOUNJARO) 2.5 MG/0.5ML Pen; Inject 2.5 mg into the skin once a week.  Dispense: 2 mL; Refill: 0  2. Other iron deficiency anemia Rebecca Mccoy is taking ferrous sulfate 325 mg twice daily.  She is undergoing workup by GI.  Note from 10/28/2020 reviewed.   Nutrition: Iron-rich foods include dark leafy greens, red and white meats, eggs, seafood, and beans.  Certain foods and drinks prevent your body from absorbing iron properly. Avoid eating these foods in the same meal as iron-rich foods or with iron supplements. These foods include: coffee, black tea, and red wine; milk, dairy products, and foods that are high in calcium; beans and soybeans; whole grains. Constipation can be a side effect of iron  supplementation. Increased water and fiber intake are helpful. Water goal: > 2 liters/day. Fiber goal: > 25 grams/day.  3. Situational stress We will continue to monitor as it relates to her weight loss journey.  4. Obesity, current BMI of 30  Course: Rebecca Mccoy is currently in the action stage of change. As such, her goal is to continue with weight loss efforts.   Nutrition goals: She has agreed to the Category 1 Plan.   Exercise goals: For substantial health benefits, adults should do at least 150 minutes (2 hours and 30 minutes) a week of moderate-intensity, or 75 minutes (1 hour and 15 minutes) a week of vigorous-intensity aerobic physical activity, or an equivalent combination of moderate- and vigorous-intensity aerobic activity. Aerobic activity should be performed in episodes of at least 10 minutes, and preferably, it should be spread throughout the week.  Behavioral modification strategies: increasing lean protein intake, decreasing simple carbohydrates, increasing vegetables, and increasing water intake.  Rebecca Mccoy has agreed to follow-up with our clinic in 4 weeks. She was informed of the importance of frequent follow-up visits to maximize her success with intensive lifestyle modifications for her multiple health conditions.   Objective:   Blood pressure 111/81, pulse 75, temperature 97.9 F (36.6 C), temperature source Oral, height 5\' 2"  (1.575 m), weight 164 lb (74.4 kg), SpO2 98 %. Body mass index is 30 kg/m.  General: Cooperative, alert, well developed, in no acute distress. HEENT: Conjunctivae and lids unremarkable. Cardiovascular: Regular rhythm.  Lungs: Normal work of breathing. Neurologic: No focal deficits.   Lab Results  Component Value Date   CREATININE 0.94 11/04/2020   BUN 10 11/04/2020   NA  141 11/04/2020   K 4.5 11/04/2020   CL 101 11/04/2020   CO2 23 11/04/2020   Lab Results  Component Value Date   ALT 13 09/05/2020   AST 16 09/05/2020   ALKPHOS 97  07/01/2020   BILITOT 0.5 09/05/2020   Lab Results  Component Value Date   HGBA1C 5.4 09/05/2020   HGBA1C 5.3 12/24/2019   HGBA1C 5.3 06/22/2019   HGBA1C 5.4 01/31/2019   HGBA1C 5.5 08/10/2018   Lab Results  Component Value Date   INSULIN 10.7 04/03/2019   Lab Results  Component Value Date   TSH 0.82 09/05/2020   Lab Results  Component Value Date   CHOL 188 09/05/2020   HDL 87 09/05/2020   LDLCALC 78 09/05/2020   TRIG 125 09/05/2020   CHOLHDL 2.2 09/05/2020   Lab Results  Component Value Date   VD25OH 67.3 05/28/2019   VD25OH 38 06/22/2018   VD25OH 53 05/13/2017   Lab Results  Component Value Date   WBC 4.6 10/21/2020   HGB 13.4 10/21/2020   HCT 41.7 10/21/2020   MCV 88.0 10/21/2020   PLT 333 10/21/2020   Lab Results  Component Value Date   IRON 97 10/16/2020   TIBC 425 10/16/2020   FERRITIN 9 (L) 09/05/2020   Attestation Statements:   Reviewed by clinician on day of visit: allergies, medications, problem list, medical history, surgical history, family history, social history, and previous encounter notes.  I, Water quality scientist, CMA, am acting as transcriptionist for Briscoe Deutscher, DO  I have reviewed the above documentation for accuracy and completeness, and I agree with the above. -  Briscoe Deutscher, DO, MS, FAAFP, DABOM - Family and Bariatric Medicine.

## 2020-11-14 DIAGNOSIS — D509 Iron deficiency anemia, unspecified: Secondary | ICD-10-CM

## 2020-11-14 HISTORY — DX: Iron deficiency anemia, unspecified: D50.9

## 2020-11-14 NOTE — Patient Instructions (Addendum)
Continue iron supplementation.  Will be seeing Dr. Ardis Hughs soon regarding iron deficiency.  Colonoscopy is due this year.  Continue iron supplementation.  Continue close follow-up with cardiologist.  Health maintenance exam due here in June 2023.

## 2020-11-19 ENCOUNTER — Other Ambulatory Visit: Payer: Self-pay | Admitting: Internal Medicine

## 2020-11-20 ENCOUNTER — Encounter: Payer: Self-pay | Admitting: Gastroenterology

## 2020-11-28 ENCOUNTER — Encounter: Payer: Self-pay | Admitting: Gastroenterology

## 2020-11-28 ENCOUNTER — Other Ambulatory Visit: Payer: Self-pay

## 2020-11-28 ENCOUNTER — Ambulatory Visit (AMBULATORY_SURGERY_CENTER): Payer: 59 | Admitting: Gastroenterology

## 2020-11-28 VITALS — BP 121/54 | HR 97 | Temp 98.4°F | Resp 15 | Ht 62.0 in | Wt 172.0 lb

## 2020-11-28 DIAGNOSIS — D509 Iron deficiency anemia, unspecified: Secondary | ICD-10-CM | POA: Diagnosis not present

## 2020-11-28 DIAGNOSIS — K2990 Gastroduodenitis, unspecified, without bleeding: Secondary | ICD-10-CM | POA: Diagnosis not present

## 2020-11-28 DIAGNOSIS — K2970 Gastritis, unspecified, without bleeding: Secondary | ICD-10-CM | POA: Diagnosis not present

## 2020-11-28 DIAGNOSIS — K2950 Unspecified chronic gastritis without bleeding: Secondary | ICD-10-CM | POA: Diagnosis not present

## 2020-11-28 MED ORDER — SODIUM CHLORIDE 0.9 % IV SOLN: 500.0000 mL | Freq: Once | INTRAVENOUS | Status: DC

## 2020-11-28 NOTE — Progress Notes (Signed)
Pt's states no medical or surgical changes since previsit or office visit. VS by DT. °

## 2020-11-28 NOTE — Patient Instructions (Signed)
Resume previous diet and medications. Repeat Colonoscopy in 10 years for surveillance. Awaiting results of biopsy taken to evaluate celiac disease.  YOU HAD AN ENDOSCOPIC PROCEDURE TODAY AT [facility]:   Refer to the procedure report that was given to you for any specific questions about what was found during the examination.  If the procedure report does not answer your questions, please call your gastroenterologist to clarify.  If you requested that your care partner not be given the details of your procedure findings, then the procedure report has been included in a sealed envelope for you to review at your convenience later.  YOU SHOULD EXPECT: Some feelings of bloating in the abdomen. Passage of more gas than usual.  Walking can help get rid of the air that was put into your GI tract during the procedure and reduce the bloating. If you had a lower endoscopy (such as a colonoscopy or flexible sigmoidoscopy) you may notice spotting of blood in your stool or on the toilet paper. If you underwent a bowel prep for your procedure, you may not have a normal bowel movement for a few days.  Please Note:  You might notice some irritation and congestion in your nose or some drainage.  This is from the oxygen used during your procedure.  There is no need for concern and it should clear up in a day or so.  SYMPTOMS TO REPORT IMMEDIATELY:  Following lower endoscopy (colonoscopy or flexible sigmoidoscopy):  Excessive amounts of blood in the stool  Significant tenderness or worsening of abdominal pains  Swelling of the abdomen that is new, acute  Fever of 100F or higher  Following upper endoscopy (EGD)  Vomiting of blood or coffee ground material  New chest pain or pain under the shoulder blades  Painful or persistently difficult swallowing  New shortness of breath  Fever of 100F or higher  Black, tarry-looking stools  For urgent or emergent issues, a gastroenterologist can be  reached at any hour by calling [phone]. Do not use MyChart messaging for urgent concerns.    DIET:  We do recommend a small meal at first, but then you may proceed to your regular diet.  Drink plenty of fluids but you should avoid alcoholic beverages for 24 hours.  ACTIVITY:  You should plan to take it easy for the rest of today and you should NOT DRIVE or use heavy machinery until tomorrow (because of the sedation medicines used during the test).    FOLLOW UP: Our staff will call the number listed on your records 48-72 hours following your procedure to check on you and address any questions or concerns that you may have regarding the information given to you following your procedure. If we do not reach you, we will leave a message.  We will attempt to reach you two times.  During this call, we will ask if you have developed any symptoms of COVID 19. If you develop any symptoms (ie: fever, flu-like symptoms, shortness of breath, cough etc.) before then, please call [phone].  If you test positive for Covid 19 in the 2 weeks post procedure, please call and report this information to [place].    If any biopsies were taken you will be contacted by phone or by letter within the next 1-3 weeks.  Please call us at [phone] if you have not heard about the biopsies in 3 weeks.    SIGNATURES/CONFIDENTIALITY: You and/or your care partner have signed paperwork which will  be entered into your electronic medical record.  These signatures attest to the fact that that the information above on your After Visit Summary has been reviewed and is understood.  Full responsibility of the confidentiality of this discharge information lies with you and/or your care-partner.

## 2020-11-28 NOTE — Progress Notes (Signed)
1426 Robinul 0.1 mg IV given due large amount of secretions upon assessment.  MD made aware, vss

## 2020-11-28 NOTE — Progress Notes (Signed)
1438 Ephedrine 10 mg given IV due to low BP, MD updated.   

## 2020-11-28 NOTE — Op Note (Signed)
[place] Patient Name: Rebecca Mccoy Procedure Date: 11/28/2020 2:24 PM MRN: [id number] Endoscopist: Milus Banister , MD Age: 55 Referring MD:  Date of Birth: February 25, 1965 Gender: Female Account #: [id number] Procedure:                Colonoscopy Indications:              Iron deficiency anemia, hemocult negative;                            colonoscopy 2017 single subCM SSP Medicines:                Monitored Anesthesia Care Procedure:                Pre-Anesthesia Assessment:                           - Prior to the procedure, a History and Physical                            was performed, and patient medications and                            allergies were reviewed. The patient's tolerance of                            previous anesthesia was also reviewed. The risks                            and benefits of the procedure and the sedation                            options and risks were discussed with the patient.                            All questions were answered, and informed consent                            was obtained. Prior Anticoagulants: The patient has                            taken no previous anticoagulant or antiplatelet                            agents. ASA Grade Assessment: II - A patient with                            mild systemic disease. After reviewing the risks                            and benefits, the patient was deemed in                            satisfactory condition to undergo the procedure.  After obtaining informed consent, the colonoscope                            was passed under direct vision. Throughout the                            procedure, the patient's blood pressure, pulse, and                            oxygen saturations were monitored continuously. The                            CF [id number] #[id number] was introduced through the anus                            and advanced to the the cecum,  identified by                            appendiceal orifice and ileocecal valve. The                            colonoscopy was performed without difficulty. The                            patient tolerated the procedure well. The quality                            of the bowel preparation was good. The ileocecal                            valve, appendiceal orifice, and rectum were                            photographed. Scope In: 2:29:24 PM Scope Out: 2:41:25 PM Scope Withdrawal Time: 0 hours 6 minutes 14 seconds  Total Procedure Duration: 0 hours 12 minutes 1 second  Findings:                 The entire examined colon appeared normal on direct                            and retroflexion views. Complications:            No immediate complications. Estimated blood loss:                            None. Estimated Blood Loss:     Estimated blood loss: none. Impression:               - The entire examined colon is normal on direct and                            retroflexion views.                           - No polyps or cancers. Recommendation:           -  Repeat colonoscopy in 10 years for screening.                           - EGD now. Milus Banister, MD 11/28/2020 2:43:27 PM This report has been signed electronically.

## 2020-11-28 NOTE — Progress Notes (Signed)
  The recent H&P (dated 10/28/20) was reviewed, the patient was examined and there is no change in the patients condition since that H&P was completed.   Rebecca Mccoy  11/28/2020, 1:51 PM

## 2020-11-28 NOTE — Progress Notes (Signed)
Report given to PACU, vss 

## 2020-11-28 NOTE — Op Note (Signed)
[place] Patient Name: Rebecca Mccoy Procedure Date: 11/28/2020 2:23 PM MRN: [id number] Endoscopist: Milus Banister , MD Age: 55 Referring MD:  Date of Birth: 1965-12-05 Gender: Female Account #: [id number] Procedure:                Upper GI endoscopy Indications:              Iron deficiency anemia, hemocult negative;                            Colonoscopy just prior to this exam was normal Medicines:                Monitored Anesthesia Care Procedure:                Pre-Anesthesia Assessment:                           - Prior to the procedure, a History and Physical                            was performed, and patient medications and                            allergies were reviewed. The patient's tolerance of                            previous anesthesia was also reviewed. The risks                            and benefits of the procedure and the sedation                            options and risks were discussed with the patient.                            All questions were answered, and informed consent                            was obtained. Prior Anticoagulants: The patient has                            taken no previous anticoagulant or antiplatelet                            agents. ASA Grade Assessment: II - A patient with                            mild systemic disease. After reviewing the risks                            and benefits, the patient was deemed in                            satisfactory condition to undergo the procedure.  After obtaining informed consent, the endoscope was                            passed under direct vision. Throughout the                            procedure, the patient's blood pressure, pulse, and                            oxygen saturations were monitored continuously. The                            Endoscope was introduced through the mouth, and                            advanced to the  second part of duodenum. The upper                            GI endoscopy was accomplished without difficulty.                            The patient tolerated the procedure well. Scope In: Scope Out: Findings:                 Mild inflammation characterized by erythema,                            friability and granularity was found in the entire                            examined stomach. Biopsies were taken with a cold                            forceps for histology.                           Biopsies for histology were taken with a cold                            forceps in the entire duodenum for evaluation of                            celiac disease.                           The exam was otherwise without abnormality. Complications:            No immediate complications. Estimated blood loss:                            None. Estimated Blood Loss:     Estimated blood loss: none. Impression:               - Mild, non-specific gastritis. Biopsied to check  for H. pylori                           - The examination was otherwise normal.                           - Biopsies were taken with a cold forceps for                            evaluation of celiac disease. Recommendation:           - Patient has a contact number available for                            emergencies. The signs and symptoms of potential                            delayed complications were discussed with the                            patient. Return to normal activities tomorrow.                            Written discharge instructions were provided to the                            patient.                           - Resume previous diet.                           - Continue present medications.                           - Await pathology results. Milus Banister, MD 11/28/2020 2:55:02 PM This report has been signed electronically.

## 2020-11-28 NOTE — Progress Notes (Signed)
Called to room to assist during endoscopic procedure.  Patient ID and intended procedure confirmed with present staff. Received instructions for my participation in the procedure from the performing physician.  

## 2020-11-28 NOTE — Progress Notes (Signed)
1430 HR > 100 with esmolol 25 mg given IV, MD updated, vss  

## 2020-12-02 ENCOUNTER — Telehealth: Payer: Self-pay | Admitting: *Deleted

## 2020-12-02 ENCOUNTER — Ambulatory Visit: Payer: 59 | Admitting: Cardiology

## 2020-12-02 NOTE — Telephone Encounter (Signed)
First attempt, left VM.  

## 2020-12-02 NOTE — Telephone Encounter (Signed)
  Follow up Call-  Call back number 11/28/2020  Post procedure Call Back phone  # [phone]  Permission to leave phone message Yes  Some recent data might be hidden     Patient questions:  Do you have a fever, pain , or abdominal swelling? No. Pain Score  0 *  Have you tolerated food without any problems? Yes.    Have you been able to return to your normal activities? Yes.    Do you have any questions about your discharge instructions: Diet   No. Medications  No. Follow up visit  No.  Do you have questions or concerns about your Care? No.  Actions: * If pain score is 4 or above: No action needed, pain <4.

## 2020-12-04 ENCOUNTER — Other Ambulatory Visit: Payer: Self-pay

## 2020-12-04 DIAGNOSIS — D509 Iron deficiency anemia, unspecified: Secondary | ICD-10-CM

## 2020-12-07 ENCOUNTER — Encounter: Payer: Self-pay | Admitting: Gastroenterology

## 2020-12-08 ENCOUNTER — Encounter (INDEPENDENT_AMBULATORY_CARE_PROVIDER_SITE_OTHER): Payer: Self-pay | Admitting: Family Medicine

## 2020-12-08 ENCOUNTER — Other Ambulatory Visit (INDEPENDENT_AMBULATORY_CARE_PROVIDER_SITE_OTHER): Payer: Self-pay | Admitting: Family Medicine

## 2020-12-08 DIAGNOSIS — R7301 Impaired fasting glucose: Secondary | ICD-10-CM

## 2020-12-08 NOTE — Telephone Encounter (Signed)
Msg sent to pt 

## 2020-12-09 ENCOUNTER — Other Ambulatory Visit: Payer: Self-pay | Admitting: Internal Medicine

## 2020-12-10 MED ORDER — TIRZEPATIDE 5 MG/0.5ML ~~LOC~~ SOAJ: 5.0000 mg | SUBCUTANEOUS | 0 refills | Status: DC

## 2020-12-16 ENCOUNTER — Other Ambulatory Visit: Payer: Self-pay

## 2020-12-16 ENCOUNTER — Other Ambulatory Visit: Payer: Self-pay | Admitting: Internal Medicine

## 2020-12-16 ENCOUNTER — Encounter (INDEPENDENT_AMBULATORY_CARE_PROVIDER_SITE_OTHER): Payer: Self-pay | Admitting: Family Medicine

## 2020-12-16 ENCOUNTER — Ambulatory Visit (INDEPENDENT_AMBULATORY_CARE_PROVIDER_SITE_OTHER): Payer: 59 | Admitting: Family Medicine

## 2020-12-16 VITALS — BP 114/75 | HR 76 | Temp 97.7°F | Ht 62.0 in | Wt 163.0 lb

## 2020-12-16 DIAGNOSIS — E039 Hypothyroidism, unspecified: Secondary | ICD-10-CM | POA: Diagnosis not present

## 2020-12-16 DIAGNOSIS — E782 Mixed hyperlipidemia: Secondary | ICD-10-CM

## 2020-12-16 DIAGNOSIS — E669 Obesity, unspecified: Secondary | ICD-10-CM

## 2020-12-16 DIAGNOSIS — R7301 Impaired fasting glucose: Secondary | ICD-10-CM | POA: Diagnosis not present

## 2020-12-16 DIAGNOSIS — Z6833 Body mass index (BMI) 33.0-33.9, adult: Secondary | ICD-10-CM

## 2020-12-17 MED ORDER — TIRZEPATIDE 5 MG/0.5ML ~~LOC~~ SOAJ: 5.0000 mg | SUBCUTANEOUS | 2 refills | Status: DC

## 2020-12-17 NOTE — Progress Notes (Signed)
Chief Complaint:   OBESITY Rebecca Mccoy is here to discuss her progress with her obesity treatment plan along with follow-up of her obesity related diagnoses. See Medical Weight Management Flowsheet for complete bioelectrical impedance results.  Today's visit was #: 22 Starting weight: 183 lbs Starting date: 04/03/2019 Weight change since last visit: 1 lb Total lbs lost to date: 20 lbs Total weight loss percentage to date: -10.93%  Nutrition Plan: Category 1 Plan for 90% of the time.  Activity: Walking for 20 minutes 5 times per week.  Anti-obesity medications: Mounjaro 5 mg subcutaneously weekly. Reported side effects: None.  Assessment/Plan:   1. Impaired fasting glucose, with polyphagia Controlled. Current treatment: Mounjaro 5 mg subcutaneously weekly.    Plan:  Continue Mounjaro 5 mg subcutaneously weekly.  Will refill today.  She will continue to focus on protein-rich, low simple carbohydrate foods. We reviewed the importance of hydration, regular exercise for stress reduction, and restorative sleep.  2. Acquired hypothyroidism Course: Controlled. Medication: levothyroxine 125 mcg daily.   Plan: Patient was instructed not to take MVM or iron within 4 hours of taking thyroid medications. This issue is managed by Dr. Renold Genta. We will continue to monitor alongside PCP as it relates to her weight loss journey.   Lab Results  Component Value Date   TSH 0.82 09/05/2020   3. Mixed hyperlipidemia Course: Controlled. Lipid-lowering medications: Crestor 5 mg daily, Fish oil.   Plan: Dietary changes: Increase soluble fiber, decrease simple carbohydrates, decrease saturated fat. Exercise changes: Moderate to vigorous-intensity aerobic activity 150 minutes per week or as tolerated. We will continue to monitor along with PCP as it pertains to her weight loss journey.  Lab Results  Component Value Date   CHOL 188 09/05/2020   HDL 87 09/05/2020   LDLCALC 78 09/05/2020   TRIG 125  09/05/2020   CHOLHDL 2.2 09/05/2020   Lab Results  Component Value Date   ALT 13 09/05/2020   AST 16 09/05/2020   ALKPHOS 97 07/01/2020   BILITOT 0.5 09/05/2020   The 10-year ASCVD risk score (Arnett DK, et al., 2019) is: 2.5%   Values used to calculate the score:     Age: 55 years     Sex: Female     Is Non-Hispanic African American: No     Diabetic: Yes     Tobacco smoker: No     Systolic Blood Pressure: 751 mmHg     Is BP treated: Yes     HDL Cholesterol: 87 mg/dL     Total Cholesterol: 188 mg/dL  4. Obesity, current BMI of 30  Course: [place] is currently in the action stage of change. As such, her goal is to continue with weight loss efforts.   Nutrition goals: She has agreed to the Category 1 Plan.   Exercise goals: For substantial health benefits, adults should do at least 150 minutes (2 hours and 30 minutes) a week of moderate-intensity, or 75 minutes (1 hour and 15 minutes) a week of vigorous-intensity aerobic physical activity, or an equivalent combination of moderate- and vigorous-intensity aerobic activity. Aerobic activity should be performed in episodes of at least 10 minutes, and preferably, it should be spread throughout the week.  Behavioral modification strategies: increasing lean protein intake, decreasing simple carbohydrates, increasing vegetables, increasing water intake, and decreasing liquid calories.  Rebecca Mccoy has agreed to follow-up with our clinic in 4 weeks. She was informed of the importance of frequent follow-up visits to maximize her success with intensive lifestyle modifications  for her multiple health conditions.   Objective:   Blood pressure 114/75, pulse 76, temperature 97.7 F (36.5 C), temperature source Oral, height 5\' 2"  (1.575 m), weight 163 lb (73.9 kg), SpO2 99 %. Body mass index is 29.81 kg/m.  General: Cooperative, alert, well developed, in no acute distress. HEENT: Conjunctivae and lids unremarkable. Cardiovascular: Regular rhythm.   Lungs: Normal work of breathing. Neurologic: No focal deficits.   Lab Results  Component Value Date   CREATININE 0.94 11/04/2020   BUN 10 11/04/2020   NA 141 11/04/2020   K 4.5 11/04/2020   CL 101 11/04/2020   CO2 23 11/04/2020   Lab Results  Component Value Date   ALT 13 09/05/2020   AST 16 09/05/2020   ALKPHOS 97 07/01/2020   BILITOT 0.5 09/05/2020   Lab Results  Component Value Date   HGBA1C 5.4 09/05/2020   HGBA1C 5.3 12/24/2019   HGBA1C 5.3 06/22/2019   HGBA1C 5.4 01/31/2019   HGBA1C 5.5 08/10/2018   Lab Results  Component Value Date   INSULIN 10.7 04/03/2019   Lab Results  Component Value Date   TSH 0.82 09/05/2020   Lab Results  Component Value Date   CHOL 188 09/05/2020   HDL 87 09/05/2020   LDLCALC 78 09/05/2020   TRIG 125 09/05/2020   CHOLHDL 2.2 09/05/2020   Lab Results  Component Value Date   VD25OH 67.3 05/28/2019   VD25OH 38 06/22/2018   VD25OH 53 05/13/2017   Lab Results  Component Value Date   WBC 4.6 10/21/2020   HGB 13.4 10/21/2020   HCT 41.7 10/21/2020   MCV 88.0 10/21/2020   PLT 333 10/21/2020   Lab Results  Component Value Date   IRON 97 10/16/2020   TIBC 425 10/16/2020   FERRITIN 9 (L) 09/05/2020   Attestation Statements:   Reviewed by clinician on day of visit: allergies, medications, problem list, medical history, surgical history, family history, social history, and previous encounter notes.  I, Water quality scientist, CMA, am acting as transcriptionist for Briscoe Deutscher, DO  I have reviewed the above documentation for accuracy and completeness, and I agree with the above. -  Briscoe Deutscher, DO, MS, FAAFP, DABOM - Family and Bariatric Medicine.

## 2021-01-09 ENCOUNTER — Encounter: Payer: Self-pay | Admitting: Internal Medicine

## 2021-01-13 MED ORDER — BUDESONIDE-FORMOTEROL FUMARATE 160-4.5 MCG/ACT IN AERO: 2.0000 | INHALATION_SPRAY | Freq: Two times a day (BID) | RESPIRATORY_TRACT | 3 refills | Status: AC

## 2021-01-13 MED ORDER — ALBUTEROL SULFATE HFA 108 (90 BASE) MCG/ACT IN AERS
1.0000 | INHALATION_SPRAY | Freq: Four times a day (QID) | RESPIRATORY_TRACT | 3 refills | Status: AC | PRN
Start: 2021-01-13 — End: ?

## 2021-01-13 NOTE — Telephone Encounter (Signed)
Refilled for a year.

## 2021-01-26 ENCOUNTER — Telehealth: Payer: Self-pay

## 2021-01-26 ENCOUNTER — Encounter: Payer: Self-pay | Admitting: Internal Medicine

## 2021-01-26 ENCOUNTER — Telehealth (INDEPENDENT_AMBULATORY_CARE_PROVIDER_SITE_OTHER): Payer: 59 | Admitting: Internal Medicine

## 2021-01-26 VITALS — BP 125/90 | HR 92 | Temp 99.0°F

## 2021-01-26 DIAGNOSIS — J069 Acute upper respiratory infection, unspecified: Secondary | ICD-10-CM

## 2021-01-26 DIAGNOSIS — U071 COVID-19: Secondary | ICD-10-CM

## 2021-01-26 MED ORDER — HYDROCODONE BIT-HOMATROP MBR 5-1.5 MG/5ML PO SOLN: 5.0000 mL | Freq: Three times a day (TID) | ORAL | 0 refills | Status: DC | PRN

## 2021-01-26 MED ORDER — METHYLPREDNISOLONE 4 MG PO TABS: ORAL_TABLET | ORAL | 0 refills | Status: DC

## 2021-01-26 MED ORDER — FLUCONAZOLE 150 MG PO TABS: 150.0000 mg | ORAL_TABLET | Freq: Once | ORAL | 0 refills | Status: AC

## 2021-01-26 MED ORDER — DOXYCYCLINE HYCLATE 100 MG PO TABS: 100.0000 mg | ORAL_TABLET | Freq: Two times a day (BID) | ORAL | 0 refills | Status: DC

## 2021-01-26 NOTE — Telephone Encounter (Signed)
Patient just came back from out of the country. [facility] [place]) She has developed a cough on Saturday and woke up hoarse, drainige and sore throat on Sunday. She took a covid test and it was positive. She would like a virtual.

## 2021-01-26 NOTE — Patient Instructions (Addendum)
Rest and drink plenty of fluids.  Stay well-hydrated.  Monitor pulse oximetry and walk some to prevent atelectasis.  Take Medrol 4 mg tablets and a tapering course starting with 6 tablets day 1 and decreasing by 1 tablet daily i.e. 6-5-4-3-2-1 taper.  Take doxycycline 100 mg twice daily for 10 days.  May take Diflucan if needed for Candida vaginitis 150 mg tablet one-time dose.  Hycodan 1 teaspoon every 8 hours as needed for cough.

## 2021-01-26 NOTE — Telephone Encounter (Signed)
Appt made

## 2021-01-26 NOTE — Progress Notes (Signed)
° °  Subjective:    Patient ID: Rebecca Mccoy, female    DOB: [birth year], 56 y.o.   MRN: [id number]  HPI Patient is just returning from cruise on large boat with about 5000 passengers that sailed around the [place] Flew out of [place] yesterday returning back home. Had to wait in airport a long time.  Has tested positive today for Covid-19 with nasal congestion discolored drainage malaise and fatigue. Husband has not developed symptoms yet.  Patient is seen by interactive audio and video telecommunications due to the coronavirus pandemic.  She is at her home and I am at my office.  She is identified using 2 identifiers as Rebecca Mccoy. Kasandra Knudsen, a patient in this practice.  She is agreeable to visit in this format today.  Patient has had 3 Covid vaccines the last one on file was July 2022.  Main complaint is upper respiratory congestion and cough with discolored nasal drainage and sputum. Has decreased energy but no myalgias or shaking chills. No nausea or vomiting.  Review of Systems see above     Objective:   Physical Exam Patient is seen virtually and appears to be pale and lethargic.  Sounds a bit nasally congested when she speaks.       Assessment & Plan:  Acute COVID-19 virus infection  Plan: Take Hycodan 1 teaspoon every 8 hours as needed for cough.  Rest and drink plenty of fluids.  Walk some to prevent atelectasis and monitor pulse oximetry.  Take doxycycline 100 mg twice daily for 10 days.  May take Diflucan 150 mg tablet if needed for Candida vaginitis.  Take Medrol taper starting with 6  4mg  tabelets day 1 and decrease by 1 tablet daily i.e. 6-5-4-3-2-1 taper.  Time spent with this visit including chart review, interviewing patient, medical decision making and E scribing medications is 20 minutes

## 2021-01-29 ENCOUNTER — Telehealth (INDEPENDENT_AMBULATORY_CARE_PROVIDER_SITE_OTHER): Payer: 59 | Admitting: Family Medicine

## 2021-01-29 ENCOUNTER — Other Ambulatory Visit: Payer: Self-pay

## 2021-01-29 ENCOUNTER — Encounter (INDEPENDENT_AMBULATORY_CARE_PROVIDER_SITE_OTHER): Payer: Self-pay | Admitting: Family Medicine

## 2021-01-29 DIAGNOSIS — E039 Hypothyroidism, unspecified: Secondary | ICD-10-CM | POA: Diagnosis not present

## 2021-01-29 DIAGNOSIS — E669 Obesity, unspecified: Secondary | ICD-10-CM

## 2021-01-29 DIAGNOSIS — Z6830 Body mass index (BMI) 30.0-30.9, adult: Secondary | ICD-10-CM

## 2021-01-29 DIAGNOSIS — R7301 Impaired fasting glucose: Secondary | ICD-10-CM | POA: Diagnosis not present

## 2021-01-29 DIAGNOSIS — U071 COVID-19: Secondary | ICD-10-CM | POA: Diagnosis not present

## 2021-01-30 ENCOUNTER — Telehealth: Payer: Self-pay | Admitting: Internal Medicine

## 2021-01-30 NOTE — Telephone Encounter (Signed)
[place] results to [facility] [phone], phone [phone]

## 2021-02-02 NOTE — Progress Notes (Signed)
TeleHealth Visit:  Due to the COVID-19 pandemic, this visit was completed with telemedicine (audio/video) technology to reduce patient and provider exposure as well as to preserve personal protective equipment.   Rebecca Mccoy has verbally consented to this TeleHealth visit. The patient is located at home, the provider is located at the [facility] office. The participants in this visit include the listed provider and patient. The visit was conducted today via MyChart video.  OBESITY Rebecca Mccoy is here to discuss her progress with her obesity treatment plan along with follow-up of her obesity related diagnoses.   Today's visit was #: 23 Starting weight: 183 lbs Starting date: 04/03/2019 Today's date: 01/29/2021 Patient reported weight:  163.8 lbs  Interim History: Rebecca Mccoy recently got back from a cruise and has tested positive for COVID.  She is being treated with antibiotics, steroids, and cough medicine.  She says she is using her CPAP and has increased hydration.  She will see Cardiology next week.  They are continuing to adjust her medications as she is still getting chest heaviness.  Nutrition Plan: the Category 1 Plan for 85% of the time.  Activity: Increased walking.  Assessment/Plan:   1. Impaired fasting glucose, with polyphagia Controlled. Current treatment: Mounjaro 5 mg subcutaneously weekly.    Plan:  Continue Mounjaro 5 mg subcutaneously weekly. She will continue to focus on protein-rich, low simple carbohydrate foods. We reviewed the importance of hydration, regular exercise for stress reduction, and restorative sleep.  2. Acquired hypothyroidism Course: Controlled. Medication: levothyroxine 125 mcg daily.   Plan: Patient was instructed not to take MVM or iron within 4 hours of taking thyroid medications. This issue is managed by Dr. Renold Genta. We will continue to monitor alongside Endocrinology/PCP as it relates to her weight loss journey.   Lab Results  Component  Value Date   TSH 0.82 09/05/2020   3. COVID-19 [place] is currently being treated for COVID.  Red flags reviewed.   4. Obesity, current BMI of 30  Rebecca Mccoy is currently in the action stage of change. As such, her goal is to continue with weight loss efforts. She has agreed to the Category 1 Plan.   Exercise goals: No exercise has been prescribed at this time.  Behavioral modification strategies: increasing lean protein intake, decreasing simple carbohydrates, increasing vegetables, and increasing water intake.  Rebecca Mccoy has agreed to follow-up with our clinic in 3-4 weeks. She was informed of the importance of frequent follow-up visits to maximize her success with intensive lifestyle modifications for her multiple health conditions.  Objective:   VITALS: Per patient if applicable, see vitals. GENERAL: Alert and in no acute distress. CARDIOPULMONARY: No increased WOB. Speaking in clear sentences.  PSYCH: Pleasant and cooperative. Speech normal rate and rhythm. Affect is appropriate. Insight and judgement are appropriate. Attention is focused, linear, and appropriate.  NEURO: Oriented as arrived to appointment on time with no prompting.   Lab Results  Component Value Date   CREATININE 0.94 11/04/2020   BUN 10 11/04/2020   NA 141 11/04/2020   K 4.5 11/04/2020   CL 101 11/04/2020   CO2 23 11/04/2020   Lab Results  Component Value Date   ALT 13 09/05/2020   AST 16 09/05/2020   ALKPHOS 97 07/01/2020   BILITOT 0.5 09/05/2020   Lab Results  Component Value Date   HGBA1C 5.4 09/05/2020   HGBA1C 5.3 12/24/2019   HGBA1C 5.3 06/22/2019   HGBA1C 5.4 01/31/2019   HGBA1C 5.5 08/10/2018   Lab  Results  Component Value Date   INSULIN 10.7 04/03/2019   Lab Results  Component Value Date   TSH 0.82 09/05/2020   Lab Results  Component Value Date   CHOL 188 09/05/2020   HDL 87 09/05/2020   LDLCALC 78 09/05/2020   TRIG 125 09/05/2020   CHOLHDL 2.2 09/05/2020   Lab Results  Component  Value Date   WBC 4.6 10/21/2020   HGB 13.4 10/21/2020   HCT 41.7 10/21/2020   MCV 88.0 10/21/2020   PLT 333 10/21/2020   Lab Results  Component Value Date   IRON 97 10/16/2020   TIBC 425 10/16/2020   FERRITIN 9 (L) 09/05/2020   Attestation Statements:   Reviewed by clinician on day of visit: allergies, medications, problem list, medical history, surgical history, family history, social history, and previous encounter notes.  I, Water quality scientist, CMA, am acting as transcriptionist for Briscoe Deutscher, DO  I have reviewed the above documentation for accuracy and completeness, and I agree with the above. - Briscoe Deutscher, DO, MS, FAAFP, DABOM - Family and Bariatric Medicine.

## 2021-02-04 ENCOUNTER — Other Ambulatory Visit: Payer: Self-pay | Admitting: Internal Medicine

## 2021-02-04 ENCOUNTER — Ambulatory Visit: Payer: 59 | Admitting: Cardiology

## 2021-02-04 ENCOUNTER — Encounter: Payer: Self-pay | Admitting: Cardiology

## 2021-02-04 ENCOUNTER — Other Ambulatory Visit: Payer: Self-pay

## 2021-02-04 VITALS — BP 118/80 | HR 90 | Ht 62.0 in | Wt 169.0 lb

## 2021-02-04 DIAGNOSIS — I471 Supraventricular tachycardia: Secondary | ICD-10-CM

## 2021-02-04 DIAGNOSIS — I10 Essential (primary) hypertension: Secondary | ICD-10-CM

## 2021-02-04 DIAGNOSIS — E785 Hyperlipidemia, unspecified: Secondary | ICD-10-CM | POA: Diagnosis not present

## 2021-02-04 DIAGNOSIS — I420 Dilated cardiomyopathy: Secondary | ICD-10-CM

## 2021-02-04 DIAGNOSIS — R079 Chest pain, unspecified: Secondary | ICD-10-CM

## 2021-02-04 DIAGNOSIS — R0789 Other chest pain: Secondary | ICD-10-CM

## 2021-02-04 HISTORY — DX: Dilated cardiomyopathy: I42.0

## 2021-02-04 MED ORDER — METOPROLOL SUCCINATE ER 50 MG PO TB24: 50.0000 mg | ORAL_TABLET | Freq: Every day | ORAL | 3 refills | Status: DC

## 2021-02-04 NOTE — Patient Instructions (Signed)
Medication Instructions:  Your physician has recommended you make the following change in your medication:   START: Metoprolol succinate 50 mg daily  *If you need a refill on your cardiac medications before your next appointment, please call your pharmacy*   Lab Work: Your physician recommends that you return for lab work in: BMP 1 week before CT. If you have labs (blood work) drawn today and your tests are completely normal, you will receive your results only by: [place] (if you have MyChart) OR A paper copy in the mail If you have any lab test that is abnormal or we need to change your treatment, we will call you to review the results.   Testing/Procedures:   Your cardiac CT will be scheduled at one of the below locations:   [facility] [address] [place], [place] [address] [phone]  [facility] [address] [place], [place] [address] [phone]  If scheduled at [facility], please arrive at the [facility] main entrance (entrance A) of [facility] 30 minutes prior to test start time. You can use the FREE valet parking offered at the main entrance (encouraged to control the heart rate for the test) Proceed to the [facility] Radiology Department (first floor) to check-in and test prep.  If scheduled at [facility], please arrive 15 mins early for check-in and test prep.  Please follow these instructions carefully (unless otherwise directed):   On the Night Before the Test: Be sure to Drink plenty of water. Do not consume any caffeinated/decaffeinated beverages or chocolate 12 hours prior to your test. Do not take any antihistamines 12 hours prior to your test.   On the Day of the Test: Drink plenty of water until 1 hour prior to the test. Do not eat any food 4 hours prior to the test. You may take your regular medications prior to the test.   HOLD Hydrochlorothiazide morning of the test. FEMALES- please wear underwire-free bra if available, avoid dresses & tight clothing       After the Test: Drink plenty of water. After receiving IV contrast, you may experience a mild flushed feeling. This is normal. On occasion, you may experience a mild rash up to 24 hours after the test. This is not dangerous. If this occurs, you can take Benadryl 25 mg and increase your fluid intake. If you experience trouble breathing, this can be serious. If it is severe call 911 IMMEDIATELY. If it is mild, please call our office.   We will call to schedule your test 2-4 weeks out understanding that some insurance companies will need an authorization prior to the service being performed.   For non-scheduling related questions, please contact the cardiac imaging nurse navigator should you have any questions/concerns: Marchia Bond, Cardiac Imaging Nurse Navigator Gordy Clement, Cardiac Imaging Nurse Navigator [facility] Direct Office Dial: [phone]   For scheduling needs, including cancellations and rescheduling, please call [place], [phone].    Follow-Up: At [facility], you and your health needs are our priority.  As part of our continuing mission to provide you with exceptional heart care, we have created designated Provider Care Teams.  These Care Teams include your primary Cardiologist (physician) and Advanced Practice Providers (APPs -  Physician Assistants and Nurse Practitioners) who all work together to provide you with the care you need, when you need it.  We recommend signing up for  the patient portal called "MyChart".  Sign up information is provided on this After Visit Summary.  MyChart is used to connect with patients for Virtual Visits (Telemedicine).  Patients are able to view lab/test results, encounter notes, upcoming appointments, etc.  Non-urgent messages can be sent to your provider as well.    To learn more about what you can do with MyChart, go to [email or web address].    Your next appointment:   3 month(s)  The format for your next appointment:   In Person  Provider:   Jenne Campus, MD    Other Instructions None

## 2021-02-04 NOTE — Progress Notes (Signed)
Cardiology Office Note:    Date:  02/04/2021   ID:  Rebecca Mccoy, DOB 1965/05/31, MRN [id number]  PCP:  Elby Showers, MD  Cardiologist:  Jenne Campus, MD    Referring MD: Elby Showers, MD   Chief Complaint  Patient presents with   Medication Management    Losartan, still ongoing chest tightness and SOB    History of Present Illness:    Rebecca Mccoy is a 56 y.o. female who was initially seen by [place] because of palpitations, she was found to have supraventricular tachycardia however lately she presented because of shortness of breath as well as some chest tightness.  She was also find to be significantly anemic and iron deficiency anemia has been corrected iron has been received lamented however echocardiogram performed showed diminished ejection fraction of 45%.  Of course that is concerning issue.  She was put on ARB in form of losartan.  She comes today to discuss that issue.  She also complained of having some chest pain that happen sometimes when she pushes her self heart for exercise.  Interestingly in 2020 she had a stress test done however at that time she did not have this kind of pain.  Stress test was normal.  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 08/14/2019   Dyslipidemia 07/03/2018   Fluid retention    GERD (gastroesophageal reflux disease)    GERD (gastroesophageal reflux disease)    Heart murmur    infant   High cholesterol    History of migraine headaches 02/17/2014   Hx of adenomatous polyp of colon 07/09/2016   Sessile serrated polyp 11/17   Hypertension    Hypothyroidism 12/03/2015   Insomnia 07/30/2016   Irregular heart beat    Migraines    Non-allergic rhinitis 06/10/2017   OSA (obstructive sleep apnea) 10/04/2017   PID (pelvic inflammatory disease)    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    Recurrent upper respiratory infection (URI)    April 2019   Sleep apnea    Supraventricular tachycardia ([place]) 08/22/2018   Thyroid disease    Urticaria     Past Surgical History:  Procedure Laterality Date   AUGMENTATION MAMMAPLASTY Bilateral 2005   BREAST SURGERY  2005   Augmentation   CARPAL TUNNEL RELEASE Bilateral    R in '07 and L '06   COLONOSCOPY  2017   COSMETIC SURGERY     leg lift     [place]   tummy tuck  2005    Current Medications: Current Meds  Medication Sig   albuterol (VENTOLIN HFA) 108 (90 Base) MCG/ACT inhaler Inhale 1-2 puffs into the lungs every 6 (six) hours as needed for wheezing or shortness of breath.   ALPRAZolam (XANAX) 1 MG tablet Take 1 tablet (1 mg total) by mouth 2 (two) times daily as needed for anxiety. for anxiety   Ascorbic Acid (VITAMIN C PO) Take 1 tablet by mouth daily. Unknown strength   ASPIRIN 81 PO Take 1 tablet by mouth daily.   B Complex Vitamins (B COMPLEX 100 PO) Take 1 capsule by mouth daily. Unknown strength   budesonide-formoterol (SYMBICORT) 160-4.5 MCG/ACT inhaler Inhale 2 puffs into the lungs 2 (two) times daily.   doxycycline (VIBRA-TABS) 100 MG tablet Take 1  tablet (100 mg total) by mouth 2 (two) times daily. (Patient taking differently: Take 100 mg by mouth 2 (two) times daily. 02/04/21 one day remaining)   Ergocalciferol (VITAMIN D2) 50 MCG (2000 UT) TABS Take 1 tablet by mouth daily.   ferrous sulfate 325 (65 FE) MG tablet Take 325 mg by mouth 2 (two) times daily with a meal.   glucosamine-chondroitin 500-400 MG tablet Take 1 tablet by mouth daily.   levothyroxine (SYNTHROID) 125 MCG tablet Take 125 mcg by mouth daily before breakfast.   losartan (COZAAR) 25 MG tablet Take 1 tablet (25 mg total) by mouth daily.   Multiple Vitamin (MULTIVITAMIN) capsule Take 1 capsule by mouth daily. Unknown strength   Multiple Vitamins-Minerals (ZINC PO) Take 1 tablet by mouth daily. Unknown strength    Omega-3 Fatty Acids (FISH OIL) 1000 MG CAPS Take 2 capsules by mouth daily.   Probiotic Product (PROBIOTIC DAILY PO) Take 1 tablet by mouth daily. Unknown strength   rosuvastatin (CRESTOR) 5 MG tablet Take 5 mg by mouth daily.   tirzepatide [facility]) 5 MG/0.5ML Pen Inject 5 mg into the skin once a week.   triamterene-hydrochlorothiazide (MAXZIDE-25) 37.5-25 MG tablet Take 1 tablet by mouth once daily (Patient taking differently: Take 1 tablet by mouth daily.)   TURMERIC PO Take 1 tablet by mouth daily. Unknown strength   [DISCONTINUED] levothyroxine (SYNTHROID) 125 MCG tablet Take 1 tablet by mouth once daily   [DISCONTINUED] rosuvastatin (CRESTOR) 5 MG tablet TAKE 1 TABLET BY MOUTH THREE TIMES A WEEK     Allergies:   Patient has no known allergies.   Social History   Socioeconomic History   Marital status: Married    Spouse name: Not on file   Number of children: 2   Years of education: Not on file   Highest education level: Not on file  Occupational History   Occupation: Operations Support Specialist  Tobacco Use   Smoking status: Never   Smokeless tobacco: Never  Vaping Use   Vaping Use: Never used  Substance and Sexual Activity   Alcohol use: Yes    Alcohol/week: 0.0 - 2.0 standard drinks    Comment: 1-2 beer weekly   Drug use: No   Sexual activity: Yes    Partners: Male    Birth control/protection: Surgical, Post-menopausal    Comment: INTERCOUSE AGE 16, SEXUAL PARTNERS MORE THAN 5  Other Topics Concern   Not on file  Social History Narrative   Not on file   Social Determinants of Health   Financial Resource Strain: Not on file  Food Insecurity: Not on file  Transportation Needs: Not on file  Physical Activity: Not on file  Stress: Not on file  Social Connections: Not on file     Family History: The patient's family history includes Allergic rhinitis in her mother; Colon polyps in her mother; Crohn's disease in her cousin; Diabetes in her maternal  grandfather, maternal grandmother, and mother; Hyperlipidemia in her father; Hypertension in her father and mother; Obesity in her mother; Pancreatic cancer in her father; Stroke in her maternal grandmother and paternal grandmother; Thyroid disease in her mother; Uterine cancer in her mother. There is no history of Colon cancer, Stomach cancer, Esophageal cancer, Rectal cancer, Liver cancer, Eczema, Urticaria, or Asthma. ROS:   Please see the history of present illness.    All 14 point review of systems negative except as described per history of present illness  EKGs/Labs/Other Studies Reviewed:      Recent Labs:  09/05/2020: ALT 13; TSH 0.82 10/21/2020: Hemoglobin 13.4; Platelets 333 11/04/2020: BUN 10; Creatinine, Ser 0.94; Potassium 4.5; Sodium 141  Recent Lipid Panel    Component Value Date/Time   CHOL 188 09/05/2020 0921   TRIG 125 09/05/2020 0921   HDL 87 09/05/2020 0921   CHOLHDL 2.2 09/05/2020 0921   VLDL 22 07/26/2016 0914   LDLCALC 78 09/05/2020 0921    Physical Exam:    VS:  BP 118/80 (BP Location: Left Arm, Patient Position: Sitting)    Pulse 90    Ht 5\' 2"  (1.575 m)    Wt 169 lb (76.7 kg)    LMP  (LMP Unknown)    SpO2 99%    BMI 30.91 kg/m     Wt Readings from Last 3 Encounters:  02/04/21 169 lb (76.7 kg)  12/16/20 163 lb (73.9 kg)  11/28/20 172 lb (78 kg)     GEN:  Well nourished, well developed in no acute distress HEENT: Normal NECK: No JVD; No carotid bruits LYMPHATICS: No lymphadenopathy CARDIAC: RRR, no murmurs, no rubs, no gallops RESPIRATORY:  Clear to auscultation without rales, wheezing or rhonchi  ABDOMEN: Soft, non-tender, non-distended MUSCULOSKELETAL:  No edema; No deformity  SKIN: Warm and dry LOWER EXTREMITIES: no swelling NEUROLOGIC:  Alert and oriented x 3 PSYCHIATRIC:  Normal affect   ASSESSMENT:    1. Dyslipidemia   2. Dilated cardiomyopathy ([place])   3. Supraventricular tachycardia ([place])   4. Primary hypertension   5. Atypical chest  pain    PLAN:    In order of problems listed above:  Dilated cardiomyopathy ejection fraction 45% I will add beta-blocker to medical regimen she will be on Toprol XL 50 mg daily plus already on losartan.  In terms of etiology of this phenomenon I will have to rule out ischemia.  Since she does have some symptoms suspicious for coronary artery disease I will ask her to have coronary CT angiogram.  In the meantime we will add beta-blocker. Supraventricular tachycardia denies having any. Dyslipidemia I did review her K PN which show me her LDL of 78 HDL 87 this is acceptable cholesterol profile for this clinical scenario however that will be verified after coronary CT angio will be done many she does have coronary artery disease we have to augment her medical therapy for dyslipidemia. Essential hypertension blood pressure well controlled.   Medication Adjustments/Labs and Tests Ordered: Current medicines are reviewed at length with the patient today.  Concerns regarding medicines are outlined above.  No orders of the defined types were placed in this encounter.  Medication changes: No orders of the defined types were placed in this encounter.   Signed, Park Liter, MD, [facility] 02/04/2021 9:50 AM    [place]

## 2021-02-05 ENCOUNTER — Telehealth: Payer: Self-pay | Admitting: Cardiology

## 2021-02-05 DIAGNOSIS — D509 Iron deficiency anemia, unspecified: Secondary | ICD-10-CM

## 2021-02-05 DIAGNOSIS — Z79899 Other long term (current) drug therapy: Secondary | ICD-10-CM

## 2021-02-05 NOTE — Telephone Encounter (Signed)
Patient calling to see if checking her iron can be added to her BMT. Please advise

## 2021-02-07 LAB — BASIC METABOLIC PANEL
BUN/Creatinine Ratio: 13 (ref 9–23)
BUN: 13 mg/dL (ref 6–24)
CO2: 24 mmol/L (ref 20–29)
Calcium: 9.8 mg/dL (ref 8.7–10.2)
Chloride: 100 mmol/L (ref 96–106)
Creatinine, Ser: 1 mg/dL (ref 0.57–1.00)
Glucose: 81 mg/dL (ref 70–99)
Potassium: 3.9 mmol/L (ref 3.5–5.2)
Sodium: 140 mmol/L (ref 134–144)
eGFR: 67 mL/min/{1.73_m2} (ref >59)

## 2021-02-09 ENCOUNTER — Telehealth: Payer: Self-pay | Admitting: Cardiology

## 2021-02-09 NOTE — Telephone Encounter (Signed)
Pt returning call for results... please advise  

## 2021-02-09 NOTE — Telephone Encounter (Signed)
Patient informed of result.

## 2021-02-10 ENCOUNTER — Other Ambulatory Visit (HOSPITAL_COMMUNITY): Payer: Self-pay | Admitting: *Deleted

## 2021-02-10 ENCOUNTER — Telehealth (HOSPITAL_COMMUNITY): Payer: Self-pay | Admitting: *Deleted

## 2021-02-10 MED ORDER — METOPROLOL TARTRATE 100 MG PO TABS: ORAL_TABLET | ORAL | 0 refills | Status: DC

## 2021-02-10 NOTE — Telephone Encounter (Signed)
Reaching out to patient to offer assistance regarding upcoming cardiac imaging study; pt verbalizes understanding of appt date/time, parking situation and where to check in, pre-test NPO status and medications ordered, and verified current allergies; name and call back number provided for further questions should they arise  Gordy Clement RN Navigator Cardiac Imaging [facility] [phone] office [phone] cell  Patient to take 100mg  metoprolol tartrate two hours prior to her cardiac CT scan and hold her daily BP medications. She is aware to arrive at 7:15am for her 7:45am scan.

## 2021-02-10 NOTE — Telephone Encounter (Signed)
Spoke with patient, advised  the following: Okay, please add iron studies that should be iron total iron-binding capacity as well as ferritin   Spoke with patient aware of approval, she will stop by tomorrow. Order on file.

## 2021-02-11 ENCOUNTER — Other Ambulatory Visit: Payer: Self-pay

## 2021-02-11 ENCOUNTER — Ambulatory Visit (HOSPITAL_COMMUNITY)
Admission: RE | Admit: 2021-02-11 | Discharge: 2021-02-11 | Disposition: A | Discharge status: Home / Self Care | Payer: 59 | Source: Ambulatory Visit | Attending: Cardiology | Admitting: Cardiology

## 2021-02-11 DIAGNOSIS — R079 Chest pain, unspecified: Secondary | ICD-10-CM | POA: Diagnosis present

## 2021-02-11 DIAGNOSIS — I2510 Atherosclerotic heart disease of native coronary artery without angina pectoris: Secondary | ICD-10-CM | POA: Diagnosis not present

## 2021-02-11 LAB — IRON AND TIBC
Iron Saturation: 35 % (ref 15–55)
Iron: 113 ug/dL (ref 27–159)
Total Iron Binding Capacity: 320 ug/dL (ref 250–450)
UIBC: 207 ug/dL (ref 131–425)

## 2021-02-11 LAB — FERRITIN: Ferritin: 108 ng/mL (ref 15–150)

## 2021-02-11 MED ORDER — SODIUM CHLORIDE 0.9 % IV BOLUS
500.0000 mL | Freq: Once | INTRAVENOUS | Status: AC
  Administered 2021-02-11: 08:00:00 500 mL via INTRAVENOUS

## 2021-02-11 MED ORDER — IOHEXOL 350 MG/ML SOLN
100.0000 mL | Freq: Once | INTRAVENOUS | Status: AC | PRN
  Administered 2021-02-11: 09:00:00 95 mL via INTRAVENOUS

## 2021-02-11 MED ORDER — NITROGLYCERIN 0.4 MG SL SUBL
0.4000 mg | SUBLINGUAL_TABLET | Freq: Once | SUBLINGUAL | Status: AC
Start: 2021-02-11 — End: 2021-02-11
  Administered 2021-02-11: 08:00:00 0.4 mg via SUBLINGUAL

## 2021-02-11 MED ORDER — NITROGLYCERIN 0.4 MG SL SUBL
SUBLINGUAL_TABLET | SUBLINGUAL | Status: AC
  Filled 2021-02-11: qty 1

## 2021-02-11 NOTE — Progress Notes (Signed)
Current BP 96/73 and HR 80. Dr. Agustin Cree notified. New orders received.

## 2021-02-13 ENCOUNTER — Telehealth: Payer: Self-pay

## 2021-02-13 NOTE — Telephone Encounter (Signed)
Results have been relayed to the patient. The patient verbalized understanding. No questions at this time.   

## 2021-02-13 NOTE — Telephone Encounter (Signed)
Patient called stating she had covid and sh just finished her antibiotic and steroid she was given for congestion. She states that she still has an extrememly runny nose and sudafed is not helping. She would like to know what she can do. Please advise.

## 2021-02-20 ENCOUNTER — Other Ambulatory Visit: Payer: Self-pay | Admitting: Internal Medicine

## 2021-03-05 ENCOUNTER — Ambulatory Visit (INDEPENDENT_AMBULATORY_CARE_PROVIDER_SITE_OTHER): Payer: 59 | Admitting: Family Medicine

## 2021-03-05 ENCOUNTER — Other Ambulatory Visit: Payer: Self-pay

## 2021-03-05 ENCOUNTER — Encounter (INDEPENDENT_AMBULATORY_CARE_PROVIDER_SITE_OTHER): Payer: Self-pay | Admitting: Family Medicine

## 2021-03-05 VITALS — BP 95/66 | HR 88 | Temp 98.0°F | Ht 62.0 in | Wt 159.0 lb

## 2021-03-05 DIAGNOSIS — J3089 Other allergic rhinitis: Secondary | ICD-10-CM

## 2021-03-05 DIAGNOSIS — Z6833 Body mass index (BMI) 33.0-33.9, adult: Secondary | ICD-10-CM

## 2021-03-05 DIAGNOSIS — G4733 Obstructive sleep apnea (adult) (pediatric): Secondary | ICD-10-CM | POA: Diagnosis not present

## 2021-03-05 DIAGNOSIS — I25119 Atherosclerotic heart disease of native coronary artery with unspecified angina pectoris: Secondary | ICD-10-CM | POA: Diagnosis not present

## 2021-03-05 DIAGNOSIS — R7301 Impaired fasting glucose: Secondary | ICD-10-CM | POA: Diagnosis not present

## 2021-03-05 DIAGNOSIS — Z6829 Body mass index (BMI) 29.0-29.9, adult: Secondary | ICD-10-CM

## 2021-03-05 DIAGNOSIS — G4709 Other insomnia: Secondary | ICD-10-CM

## 2021-03-05 DIAGNOSIS — E669 Obesity, unspecified: Secondary | ICD-10-CM

## 2021-03-06 ENCOUNTER — Encounter (INDEPENDENT_AMBULATORY_CARE_PROVIDER_SITE_OTHER): Payer: Self-pay | Admitting: Family Medicine

## 2021-03-09 MED ORDER — TIRZEPATIDE 7.5 MG/0.5ML ~~LOC~~ SOAJ: 7.5000 mg | SUBCUTANEOUS | 0 refills | Status: DC

## 2021-03-09 MED ORDER — ZOLPIDEM TARTRATE 5 MG PO TABS: 5.0000 mg | ORAL_TABLET | Freq: Every day | ORAL | 0 refills | Status: DC

## 2021-03-09 NOTE — Progress Notes (Signed)
Chief Complaint:   OBESITY Rebecca Mccoy is here to discuss her progress with her obesity treatment plan along with follow-up of her obesity related diagnoses. See Medical Weight Management Flowsheet for complete bioelectrical impedance results.  Today's visit was #: 24 Starting weight: 183 lbs Starting date: 04/03/2019 Weight change since last visit: 4 lbs Total lbs lost to date: 24 lbs Total weight loss percentage to date: -13.11%  Nutrition Plan: Category 1 Plan for 90-95% of the time.  Activity: Rowing/walking 1-2 miles for 30 minutes 5-7 times per week. Anti-obesity medications: Mounjaro 5 mg subcutaneously weekly. Reported side effects: None.  Interim History: [name] endorses headaches in the morning.  She says she has also been fatigued.  We reviewed her Cardiology notes/studies.  Assessment/Plan:   1. Coronary artery disease with angina pectoris, unspecified vessel or lesion type, unspecified whether native or transplanted heart [facility]) Followed by Cardiology.  She endorses fatigue.  She was originally taking Metoprolol 25 mg. Her current dose of 50 mg daily may be too high.   Plan:  Decrease metoprolol to 25 mg daily. Risk versus benefits of medication reviewed. The patient understands monitoring parameters and red flags.   2. OSA (obstructive sleep apnea) She endorses morning headache.  She will get a new CPAP mask.  OSA is a cause of systemic hypertension and is associated with an increased incidence of stroke, heart failure, atrial fibrillation, and coronary heart disease. Severe OSA increases all-cause mortality and cardiovascular mortality.   Goal: Treatment of OSA via CPAP compliance and weight loss. Plasma ghrelin levels (appetite or "hunger hormone") are significantly higher in OSA patients than in BMI-matched controls, but decrease to levels similar to those of obese patients without OSA after CPAP treatment.  Weight loss improves OSA by several mechanisms, including  reduction in fatty tissue in the throat (i.e. parapharyngeal fat) and the tongue. Loss of abdominal fat increases mediastinal traction on the upper airway making it less likely to collapse during sleep. Studies have also shown that compliance with CPAP treatment improves leptin (hunger inhibitory hormone) imbalance.  3. Other insomnia This is poorly controlled. Current treatment: None.  Plan:  After discussion, patient would like to start below medication. Expectations, risks, and potential side effects reviewed.   - Start zolpidem (AMBIEN) 5 MG tablet; Take 1 tablet (5 mg total) by mouth at bedtime.  Dispense: 30 tablet; Refill: 0  4. Impaired fasting glucose, with polyphagia Not at goal. Current treatment: Mounjaro 5 mg subcutaneously weekly.    Plan: Increase Mounjaro to 7.5 mg subcutaneously weekly.  She will continue to focus on protein-rich, low simple carbohydrate foods. We reviewed the importance of hydration, regular exercise for stress reduction, and restorative sleep.  - Increase tirzepatide (MOUNJARO) 7.5 MG/0.5ML Pen; Inject 7.5 mg into the skin once a week.  Dispense: 2 mL; Refill: 0  5. Allergic rhinitis due to other allergic trigger She is taking chlorpheniramine several times a day. Hold night dose to see if this helps with the am headaches.  6. Obesity, current BMI of 29.1  Course: [name] is currently in the action stage of change. As such, her goal is to continue with weight loss efforts.   Nutrition goals: She has agreed to the Category 1 Plan.   Exercise goals:  As is.  Behavioral modification strategies: increasing lean protein intake, decreasing simple carbohydrates, and increasing vegetables.  [name] has agreed to follow-up with our clinic in 4 weeks. She was informed of the importance of frequent follow-up visits  to maximize her success with intensive lifestyle modifications for her multiple health conditions.   Objective:   Blood pressure 95/66, pulse 88,  temperature 98 F (36.7 C), temperature source Oral, height 5\' 2"  (1.575 m), weight 159 lb (72.1 kg), SpO2 99 %. Body mass index is 29.08 kg/m.  General: Cooperative, alert, well developed, in no acute distress. HEENT: Conjunctivae and lids unremarkable. Cardiovascular: Regular rhythm.  Lungs: Normal work of breathing. Neurologic: No focal deficits.   Lab Results  Component Value Date   CREATININE 1.00 02/06/2021   BUN 13 02/06/2021   NA 140 02/06/2021   K 3.9 02/06/2021   CL 100 02/06/2021   CO2 24 02/06/2021   Lab Results  Component Value Date   ALT 13 09/05/2020   AST 16 09/05/2020   ALKPHOS 97 07/01/2020   BILITOT 0.5 09/05/2020   Lab Results  Component Value Date   HGBA1C 5.4 09/05/2020   HGBA1C 5.3 12/24/2019   HGBA1C 5.3 06/22/2019   HGBA1C 5.4 01/31/2019   HGBA1C 5.5 08/10/2018   Lab Results  Component Value Date   INSULIN 10.7 04/03/2019   Lab Results  Component Value Date   TSH 0.82 09/05/2020   Lab Results  Component Value Date   CHOL 188 09/05/2020   HDL 87 09/05/2020   LDLCALC 78 09/05/2020   TRIG 125 09/05/2020   CHOLHDL 2.2 09/05/2020   Lab Results  Component Value Date   VD25OH 67.3 05/28/2019   VD25OH 38 06/22/2018   VD25OH 53 05/13/2017   Lab Results  Component Value Date   WBC 4.6 10/21/2020   HGB 13.4 10/21/2020   HCT 41.7 10/21/2020   MCV 88.0 10/21/2020   PLT 333 10/21/2020   Lab Results  Component Value Date   IRON 113 02/10/2021   TIBC 320 02/10/2021   FERRITIN 108 02/10/2021   Attestation Statements:   Reviewed by clinician on day of visit: allergies, medications, problem list, medical history, surgical history, family history, social history, and previous encounter notes.  I, Water quality scientist, CMA, am acting as transcriptionist for Briscoe Deutscher, DO  I have reviewed the above documentation for accuracy and completeness, and I agree with the above. -  Briscoe Deutscher, DO, MS, FAAFP, DABOM - Family and Bariatric Medicine.

## 2021-03-20 ENCOUNTER — Other Ambulatory Visit: Payer: Self-pay | Admitting: Internal Medicine

## 2021-03-24 ENCOUNTER — Other Ambulatory Visit: Payer: Self-pay | Admitting: Internal Medicine

## 2021-03-24 ENCOUNTER — Encounter (INDEPENDENT_AMBULATORY_CARE_PROVIDER_SITE_OTHER): Payer: Self-pay | Admitting: Family Medicine

## 2021-03-24 DIAGNOSIS — R7301 Impaired fasting glucose: Secondary | ICD-10-CM

## 2021-03-25 MED ORDER — TIRZEPATIDE 5 MG/0.5ML ~~LOC~~ SOAJ: 5.0000 mg | SUBCUTANEOUS | 0 refills | Status: DC

## 2021-03-25 NOTE — Telephone Encounter (Signed)
Dr.Wallace °

## 2021-03-27 ENCOUNTER — Other Ambulatory Visit: Payer: Self-pay | Admitting: Cardiology

## 2021-04-02 ENCOUNTER — Other Ambulatory Visit: Payer: Self-pay

## 2021-04-02 ENCOUNTER — Ambulatory Visit (INDEPENDENT_AMBULATORY_CARE_PROVIDER_SITE_OTHER): Payer: 59 | Admitting: Family Medicine

## 2021-04-02 ENCOUNTER — Encounter (INDEPENDENT_AMBULATORY_CARE_PROVIDER_SITE_OTHER): Payer: Self-pay | Admitting: Family Medicine

## 2021-04-02 VITALS — BP 100/69 | HR 83 | Temp 98.0°F | Ht 62.0 in | Wt 161.0 lb

## 2021-04-02 DIAGNOSIS — E669 Obesity, unspecified: Secondary | ICD-10-CM

## 2021-04-02 DIAGNOSIS — G4709 Other insomnia: Secondary | ICD-10-CM

## 2021-04-02 DIAGNOSIS — Z6829 Body mass index (BMI) 29.0-29.9, adult: Secondary | ICD-10-CM

## 2021-04-02 DIAGNOSIS — G4733 Obstructive sleep apnea (adult) (pediatric): Secondary | ICD-10-CM | POA: Diagnosis not present

## 2021-04-02 DIAGNOSIS — R632 Polyphagia: Secondary | ICD-10-CM

## 2021-04-02 DIAGNOSIS — Z9989 Dependence on other enabling machines and devices: Secondary | ICD-10-CM

## 2021-04-02 MED ORDER — BELSOMRA 5 MG PO TABS: 5.0000 mg | ORAL_TABLET | Freq: Every evening | ORAL | 0 refills | Status: DC | PRN

## 2021-04-02 MED ORDER — TIRZEPATIDE 7.5 MG/0.5ML ~~LOC~~ SOAJ: 7.5000 mg | SUBCUTANEOUS | 3 refills | Status: DC

## 2021-04-09 ENCOUNTER — Ambulatory Visit: Payer: 59 | Admitting: Cardiology

## 2021-04-09 ENCOUNTER — Other Ambulatory Visit: Payer: Self-pay

## 2021-04-09 VITALS — BP 108/72 | HR 73 | Ht 62.0 in | Wt 166.0 lb

## 2021-04-09 DIAGNOSIS — E785 Hyperlipidemia, unspecified: Secondary | ICD-10-CM | POA: Diagnosis not present

## 2021-04-09 DIAGNOSIS — G4733 Obstructive sleep apnea (adult) (pediatric): Secondary | ICD-10-CM

## 2021-04-09 DIAGNOSIS — I471 Supraventricular tachycardia: Secondary | ICD-10-CM | POA: Diagnosis not present

## 2021-04-09 DIAGNOSIS — I420 Dilated cardiomyopathy: Secondary | ICD-10-CM | POA: Diagnosis not present

## 2021-04-09 MED ORDER — TRIAMTERENE-HCTZ 37.5-25 MG PO TABS: 1.0000 | ORAL_TABLET | ORAL | 3 refills | Status: DC

## 2021-04-09 MED ORDER — ASPIRIN EC 81 MG PO TBEC: 81.0000 mg | DELAYED_RELEASE_TABLET | Freq: Every day | ORAL | 3 refills | Status: AC

## 2021-04-09 NOTE — Progress Notes (Signed)
Cardiology Office Note:    Date:  04/09/2021   ID:  Rebecca Mccoy, DOB 10/04/1965, MRN [id number]  PCP:  Margaree Mackintosh, MD  Cardiologist:  Gypsy Balsam, MD    Referring MD: Margaree Mackintosh, MD   Chief Complaint  Patient presents with   Follow-up  I am doing much better  History of Present Illness:    Rebecca Mccoy is a 56 y.o. female with past medical history significant for palpitations she was found to have supraventricular tachycardia suppressed with beta-blocker, also was complaining of having some shortness of breath as well as chest tightness.  Echocardiogram has been performed which showed surprisingly ejection fraction 45%.  She has been put on beta-blocker, she also got evaluation for coronary artery disease which showed only mild nonobstructive disease involving proximal portion of the circumflex artery.  It does not justify to reason for cardiomyopathy. She is coming today 2 months for follow-up.  She says she is feeling better does not have much palpitation there is no shortness of breath.  However she had to cut down her beta-blocker because of low blood pressure.  Since that time she is doing well  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 08/14/2019   Dyslipidemia 07/03/2018   Fluid retention    GERD (gastroesophageal reflux disease)    GERD (gastroesophageal reflux disease)    Heart murmur    infant   High cholesterol    History of migraine headaches 02/17/2014   Hx of adenomatous polyp of colon 07/09/2016   Sessile serrated polyp 11/17   Hypertension    Hypothyroidism 12/03/2015   Insomnia 07/30/2016   Irregular heart beat    Migraines    Non-allergic rhinitis 06/10/2017   OSA (obstructive sleep apnea) 10/04/2017   PID (pelvic inflammatory disease)    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   Recurrent  upper respiratory infection (URI)    April 2019   Sleep apnea    Supraventricular tachycardia (HCC) 08/22/2018   Thyroid disease    Urticaria     Past Surgical History:  Procedure Laterality Date   AUGMENTATION MAMMAPLASTY Bilateral 2005   BREAST SURGERY  2005   Augmentation   CARPAL TUNNEL RELEASE Bilateral    R in '07 and L '06   COLONOSCOPY  2017   COSMETIC SURGERY     leg lift     TONSILLECTOMY  1972   TUBAL LIGATION  1989   tummy tuck  2005    Current Medications: Current Meds  Medication Sig   albuterol (VENTOLIN HFA) 108 (90 Base) MCG/ACT inhaler Inhale 1-2 puffs into the lungs every 6 (six) hours as needed for wheezing or shortness of breath.   ALPRAZolam (XANAX) 1 MG tablet Take 1 tablet by mouth twice daily as needed for anxiety (Patient taking differently: Take 1 mg by mouth as needed for anxiety.)   Ascorbic Acid (VITAMIN C PO) Take 1 tablet by mouth daily. Unknown strength   ASPIRIN 81 PO Take 1 tablet by mouth daily.   B Complex Vitamins (B COMPLEX 100 PO) Take 1 capsule by mouth daily. Unknown strength   budesonide-formoterol (SYMBICORT) 160-4.5 MCG/ACT inhaler Inhale 2 puffs into the lungs 2 (two) times daily.   Ergocalciferol (VITAMIN D2) 50 MCG (2000 UT) TABS Take 1 tablet by mouth daily.  ferrous sulfate 325 (65 FE) MG tablet Take 325 mg by mouth 2 (two) times daily with a meal.   glucosamine-chondroitin 500-400 MG tablet Take 1 tablet by mouth daily.   levothyroxine (SYNTHROID) 125 MCG tablet Take 1 tablet by mouth once daily (Patient taking differently: Take 125 mcg by mouth daily before breakfast.)   losartan (COZAAR) 25 MG tablet Take 1 tablet by mouth once daily (Patient taking differently: Take 25 mg by mouth daily.)   metoprolol succinate (TOPROL-XL) 50 MG 24 hr tablet Take 1 tablet (50 mg total) by mouth daily. Take with or immediately following a meal. (Patient taking differently: Take 25 mg by mouth daily. Take with or immediately following a meal.)    Multiple Vitamin (MULTIVITAMIN) capsule Take 1 capsule by mouth daily. Unknown strength   Multiple Vitamins-Minerals (ZINC PO) Take 1 tablet by mouth daily. Unknown strength   Omega-3 Fatty Acids (FISH OIL) 1000 MG CAPS Take 2 capsules by mouth daily.   Probiotic Product (PROBIOTIC DAILY PO) Take 1 tablet by mouth daily. Unknown strength   rosuvastatin (CRESTOR) 5 MG tablet TAKE 1 TABLET BY MOUTH THREE TIMES A WEEK (Patient taking differently: Take 5 mg by mouth daily.)   Suvorexant (BELSOMRA) 5 MG TABS Take 5 mg by mouth at bedtime as needed. (Patient taking differently: Take 5 mg by mouth at bedtime as needed (sleep).)   tirzepatide (MOUNJARO) 7.5 MG/0.5ML Pen Inject 7.5 mg into the skin once a week.   triamterene-hydrochlorothiazide (MAXZIDE-25) 37.5-25 MG tablet Take 1 tablet by mouth once daily (Patient taking differently: Take 1 tablet by mouth daily.)   TURMERIC PO Take 1 tablet by mouth daily. Unknown strength     Allergies:   Patient has no known allergies.   Social History   Socioeconomic History   Marital status: Married    Spouse name: Not on file   Number of children: 2   Years of education: Not on file   Highest education level: Not on file  Occupational History   Occupation: Operations Support Specialist  Tobacco Use   Smoking status: Never   Smokeless tobacco: Never  Vaping Use   Vaping Use: Never used  Substance and Sexual Activity   Alcohol use: Yes    Alcohol/week: 0.0 - 2.0 standard drinks    Comment: 1-2 beer weekly   Drug use: No   Sexual activity: Yes    Partners: Male    Birth control/protection: Surgical, Post-menopausal    Comment: INTERCOUSE AGE 56, SEXUAL PARTNERS MORE THAN 5  Other Topics Concern   Not on file  Social History Narrative   Not on file   Social Determinants of Health   Financial Resource Strain: Not on file  Food Insecurity: Not on file  Transportation Needs: Not on file  Physical Activity: Not on file  Stress: Not on file   Social Connections: Not on file     Family History: The patient's family history includes Allergic rhinitis in her mother; Colon polyps in her mother; Crohn's disease in her cousin; Diabetes in her maternal grandfather, maternal grandmother, and mother; Hyperlipidemia in her father; Hypertension in her father and mother; Obesity in her mother; Pancreatic cancer in her father; Stroke in her maternal grandmother and paternal grandmother; Thyroid disease in her mother; Uterine cancer in her mother. There is no history of Colon cancer, Stomach cancer, Esophageal cancer, Rectal cancer, Liver cancer, Eczema, Urticaria, or Asthma. ROS:   Please see the history of present illness.    All 14  point review of systems negative except as described per history of present illness  EKGs/Labs/Other Studies Reviewed:      Recent Labs: 09/05/2020: ALT 13; TSH 0.82 10/21/2020: Hemoglobin 13.4; Platelets 333 02/06/2021: BUN 13; Creatinine, Ser 1.00; Potassium 3.9; Sodium 140  Recent Lipid Panel    Component Value Date/Time   CHOL 188 09/05/2020 0921   TRIG 125 09/05/2020 0921   HDL 87 09/05/2020 0921   CHOLHDL 2.2 09/05/2020 0921   VLDL 22 07/26/2016 0914   LDLCALC 78 09/05/2020 0921    Physical Exam:    VS:  BP 108/72 (BP Location: Right Arm, Patient Position: Bed low/side rails up)   Pulse 73   Ht 5\' 2"  (1.575 m)   Wt 166 lb (75.3 kg)   LMP  (LMP Unknown)   SpO2 98%   BMI 30.36 kg/m     Wt Readings from Last 3 Encounters:  04/09/21 166 lb (75.3 kg)  04/02/21 161 lb (73 kg)  03/05/21 159 lb (72.1 kg)     GEN:  Well nourished, well developed in no acute distress HEENT: Normal NECK: No JVD; No carotid bruits LYMPHATICS: No lymphadenopathy CARDIAC: RRR, no murmurs, no rubs, no gallops RESPIRATORY:  Clear to auscultation without rales, wheezing or rhonchi  ABDOMEN: Soft, non-tender, non-distended MUSCULOSKELETAL:  No edema; No deformity  SKIN: Warm and dry LOWER EXTREMITIES: no  swelling NEUROLOGIC:  Alert and oriented x 3 PSYCHIATRIC:  Normal affect   ASSESSMENT:    1. Supraventricular tachycardia (HCC)   2. Dilated cardiomyopathy (HCC)   3. OSA (obstructive sleep apnea)   4. Dyslipidemia    PLAN:    In order of problems listed above:  Cardiomyopathy with ejection fraction 45%.  She was on very small dose of beta-blocker however that the small dose he was given cut in half because of low blood pressure I did review her medications.  She is taking Maxide.  Initial intention was to completely discontinue Maxide.  But she has been on this medication for a long time she does not want to abruptly stop the medication.  She is worried about weight gain.  She said that anytime she tried to stop this medication she will gain 2 pounds and she has been put back on it.  I told her the reason for metoprolol and potentially in the future Entresto/losartan is to improve left ventricle ejection fraction which is fundamentally important.  I told her if we cut down her Maxide and she gained 2 or 3 pounds there is no drama however if Cardiomyopathy progress then we may have significant difficulties.  Therefore we will In a half.  Blood pressure improved I will go on full dose of metoprolol 50, see her back in my office in few months if everything is fine at that time completely discontinue Maxide and then ARB or Entresto. Supraventricular tachycardia denies having any seems to be suppressed with beta-blocker Obstructive sleep apnea that being followed by internal medicine team Dyslipidemia, she is taking Crestor 5 which I will continue.  I did review her lab work test which showed LDL of 78 HDL 87.  Continue present management   Medication Adjustments/Labs and Tests Ordered: Current medicines are reviewed at length with the patient today.  Concerns regarding medicines are outlined above.  No orders of the defined types were placed in this encounter.  Medication changes: No orders of  the defined types were placed in this encounter.   Signed, Georgeanna Lea, MD, [facility] 04/09/2021 10:45  AM    [facility]

## 2021-04-09 NOTE — Patient Instructions (Signed)
Medication Instructions:  ?Your physician has recommended you make the following change in your medication:  ? ?START: Triamterene - Hydrochlorothiazide 37.5-25 mg every other day ?START: Aspirin 81 mg daily ? ?*If you need a refill on your cardiac medications before your next appointment, please call your pharmacy* ? ? ?Lab Work: ?None ?If you have labs (blood work) drawn today and your tests are completely normal, you will receive your results only by: ?MyChart Message (if you have MyChart) OR ?A paper copy in the mail ?If you have any lab test that is abnormal or we need to change your treatment, we will call you to review the results. ? ? ?Testing/Procedures: ?None ? ? ?Follow-Up: ?At [facility], you and your health needs are our priority.  As part of our continuing mission to provide you with exceptional heart care, we have created designated Provider Care Teams.  These Care Teams include your primary Cardiologist (physician) and Advanced Practice Providers (APPs -  Physician Assistants and Nurse Practitioners) who all work together to provide you with the care you need, when you need it. ? ?We recommend signing up for the patient portal called "MyChart".  Sign up information is provided on this After Visit Summary.  MyChart is used to connect with patients for Virtual Visits (Telemedicine).  Patients are able to view lab/test results, encounter notes, upcoming appointments, etc.  Non-urgent messages can be sent to your provider as well.   ?To learn more about what you can do with MyChart, go to [email or web address].   ? ?Your next appointment:   ?3 month(s) ? ?The format for your next appointment:   ?In Person ? ?Provider:   ?Jenne Campus, MD  ? ? ?Other Instructions ?None ? ?

## 2021-04-09 NOTE — Addendum Note (Signed)
Addended by: Edwyna Shell I on: 04/09/2021 11:10 AM ? ? Modules accepted: Orders ? ?

## 2021-04-14 NOTE — Progress Notes (Signed)
Chief Complaint:   OBESITY Rebecca Mccoy is here to discuss her progress with her obesity treatment plan along with follow-up of her obesity related diagnoses. See Medical Weight Management Flowsheet for complete bioelectrical impedance results.  Today's visit was #: 25 Starting weight: 183 lbs Starting date: 04/03/2019 Weight change since last visit: +2 lbs Total lbs lost to date: 22 lbs Total weight loss percentage to date: -12.02%  Nutrition Plan: Category 1 Plan for 85% of the time.  Activity: Rowing/walking 2 miles for 30-45 minutes 2-5 times per week. Anti-obesity medications: Mounjaro 7.5 mg subcutaneously weekly. Reported side effects: None.  Interim History: Rebecca Mccoy says her sleep is poor.  She has increased polyphagia.  She says her CPAP needs to be evaluated.    Assessment/Plan:   1. Other insomnia This is poorly controlled. Dysfunction: difficulty falling asleep.  Current treatment: Rebecca Mccoy takes Xanax 1 mg 1 hour before bed and takes melatonin when she lays down.  Plan:  Start Belsomra 5 mg at bedtime as needed for sleep. Recommend sleep hygiene measures including regular sleep schedule, optimal sleep environment, and relaxing presleep rituals.   - Suvorexant (BELSOMRA) 5 MG TABS; Take 5 mg by mouth at bedtime as needed. (Patient taking differently: Take 5 mg by mouth at bedtime as needed (sleep).)  Dispense: 30 tablet; Refill: 0  2. OSA on CPAP Will place referral back to [place].   OSA is a cause of systemic hypertension and is associated with an increased incidence of stroke, heart failure, atrial fibrillation, and coronary heart disease. Severe OSA increases all-cause mortality and cardiovascular mortality.   Goal: Treatment of OSA via CPAP compliance and weight loss. Plasma ghrelin levels (appetite or "hunger hormone") are significantly higher in OSA patients than in BMI-matched controls, but decrease to levels similar to those of obese patients without OSA  after CPAP treatment.  Weight loss improves OSA by several mechanisms, including reduction in fatty tissue in the throat (i.e. parapharyngeal fat) and the tongue. Loss of abdominal fat increases mediastinal traction on the upper airway making it less likely to collapse during sleep. Studies have also shown that compliance with CPAP treatment improves leptin (hunger inhibitory hormone) imbalance.  - Ambulatory referral to Pulmonology  3. Polyphagia Improving, but not optimized. Current treatment: Mounjaro 7.5 mg subcutaneously weekly.  She just started this pen.    Plan: Continue Mounjaro 7.5 mg subcutaneously weekly.  Will refill today, as per below. She will continue to focus on protein-rich, low simple carbohydrate foods. We reviewed the importance of hydration, regular exercise for stress reduction, and restorative sleep.  - Refill tirzepatide (MOUNJARO) 7.5 MG/0.5ML Pen; Inject 7.5 mg into the skin once a week.  Dispense: 2 mL; Refill: 3  4. Obesity with current BMI of 29.4  Course: Rebecca Mccoy is currently in the action stage of change. As such, her goal is to continue with weight loss efforts.   Nutrition goals: She has agreed to the Category 1 Plan.   Exercise goals:  As is.  Behavioral modification strategies: increasing lean protein intake, decreasing simple carbohydrates, and increasing vegetables.  Rebecca Mccoy has agreed to follow-up with our clinic in 4 weeks. She was informed of the importance of frequent follow-up visits to maximize her success with intensive lifestyle modifications for her multiple health conditions.   Objective:   Blood pressure 100/69, pulse 83, temperature 98 F (36.7 C), temperature source Oral, height '5\' 2"'$  (1.575 m), weight 161 lb (73 kg), SpO2 100 %. Body mass index is  29.45 kg/m.  General: Cooperative, alert, well developed, in no acute distress. HEENT: Conjunctivae and lids unremarkable. Cardiovascular: Regular rhythm.  Lungs: Normal work of  breathing. Neurologic: No focal deficits.   Lab Results  Component Value Date   CREATININE 1.00 02/06/2021   BUN 13 02/06/2021   NA 140 02/06/2021   K 3.9 02/06/2021   CL 100 02/06/2021   CO2 24 02/06/2021   Lab Results  Component Value Date   ALT 13 09/05/2020   AST 16 09/05/2020   ALKPHOS 97 07/01/2020   BILITOT 0.5 09/05/2020   Lab Results  Component Value Date   HGBA1C 5.4 09/05/2020   HGBA1C 5.3 12/24/2019   HGBA1C 5.3 06/22/2019   HGBA1C 5.4 01/31/2019   HGBA1C 5.5 08/10/2018   Lab Results  Component Value Date   INSULIN 10.7 04/03/2019   Lab Results  Component Value Date   TSH 0.82 09/05/2020   Lab Results  Component Value Date   CHOL 188 09/05/2020   HDL 87 09/05/2020   LDLCALC 78 09/05/2020   TRIG 125 09/05/2020   CHOLHDL 2.2 09/05/2020   Lab Results  Component Value Date   VD25OH 67.3 05/28/2019   VD25OH 38 06/22/2018   VD25OH 53 05/13/2017   Lab Results  Component Value Date   WBC 4.6 10/21/2020   HGB 13.4 10/21/2020   HCT 41.7 10/21/2020   MCV 88.0 10/21/2020   PLT 333 10/21/2020   Lab Results  Component Value Date   IRON 113 02/10/2021   TIBC 320 02/10/2021   FERRITIN 108 02/10/2021   Attestation Statements:   Reviewed by clinician on day of visit: allergies, medications, problem list, medical history, surgical history, family history, social history, and previous encounter notes.  I, Water quality scientist, CMA, am acting as transcriptionist for Briscoe Deutscher, DO  I have reviewed the above documentation for accuracy and completeness, and I agree with the above. -  Briscoe Deutscher, DO, MS, FAAFP, DABOM - Family and Bariatric Medicine.

## 2021-04-24 ENCOUNTER — Ambulatory Visit: Payer: 59 | Admitting: Cardiology

## 2021-04-27 ENCOUNTER — Ambulatory Visit (INDEPENDENT_AMBULATORY_CARE_PROVIDER_SITE_OTHER): Payer: 59 | Admitting: Family Medicine

## 2021-05-16 NOTE — Progress Notes (Signed)
05/19/21- 56 yoF never smoker for sleep evaluation with concern of OSA, courtesy of Dr Renold Genta ?Medical problem list includes OSA, Insomnia,  Dilated Cardiomyopathy, HTN, Migraine, SVT, Non-allergic Rhinitis, GERD, Hypothyroid, Anxiety/Depression, Obesity, Dyslipidemia,  FE Anemia,  ?HST 09/28/17- AHI 15.4/ hr, desaturation to 75%, body weight 198 lbs ?CPAP/ [employer] ?Body weight today-167 lbs ?Epworth score ?-----Patient states that she had sleep study done in 2019 and has cpap but has not worn it over the last couple months because she does not have a mask that she likes or works well for her and she wakes up after 3-4 hours with a very dry mouth. Patient takes a xanax about an hour before to bed and then 10 mg melatonin about 20 minutes before bed to go to sleep and " shut her brain down" ?Discussed insomnia as a separate problem. No naps. We will come back to this once OSA status resolved. ?Discussed mask fiting, chin strap, humidifier. ?ENT surgery for tonsils as child. Seasonal allergic rhinitis. Daytime sleepiness. Caffeine limited to green tea. ? ?Prior to Admission medications   ?Medication Sig Start Date End Date Taking? Authorizing Provider  ?albuterol (VENTOLIN HFA) 108 (90 Base) MCG/ACT inhaler Inhale 1-2 puffs into the lungs every 6 (six) hours as needed for wheezing or shortness of breath. 01/13/21  Yes Baxley, Cresenciano Lick, MD  ?ALPRAZolam Duanne Moron) 1 MG tablet Take 1 tablet by mouth twice daily as needed for anxiety 05/18/21  Yes Baxley, Cresenciano Lick, MD  ?Ascorbic Acid (VITAMIN C PO) Take 1 tablet by mouth daily. Unknown strength   Yes [provider]  ?aspirin EC 81 MG tablet Take 1 tablet (81 mg total) by mouth daily. Swallow whole. 04/09/21  Yes Park Liter, MD  ?B Complex Vitamins (B COMPLEX 100 PO) Take 1 capsule by mouth daily. Unknown strength   Yes [provider]  ?budesonide-formoterol (SYMBICORT) 160-4.5 MCG/ACT inhaler Inhale 2 puffs into the lungs 2 (two) times daily. 01/13/21   Yes Baxley, Cresenciano Lick, MD  ?Ergocalciferol (VITAMIN D2) 50 MCG (2000 UT) TABS Take 1 tablet by mouth daily.   Yes [provider]  ?ferrous sulfate 325 (65 FE) MG tablet Take 325 mg by mouth 2 (two) times daily with a meal.   Yes [provider]  ?glucosamine-chondroitin 500-400 MG tablet Take 1 tablet by mouth daily.   Yes [provider]  ?levothyroxine (SYNTHROID) 125 MCG tablet Take 1 tablet by mouth once daily ?Patient taking differently: Take 125 mcg by mouth daily before breakfast. 03/24/21  Yes Baxley, Cresenciano Lick, MD  ?losartan (COZAAR) 25 MG tablet Take 1 tablet by mouth once daily ?Patient taking differently: Take 25 mg by mouth daily. 03/27/21  Yes Park Liter, MD  ?metoprolol succinate (TOPROL-XL) 50 MG 24 hr tablet Take 1 tablet (50 mg total) by mouth daily. Take with or immediately following a meal. ?Patient taking differently: Take 25 mg by mouth daily. Take with or immediately following a meal. 02/04/21  Yes Park Liter, MD  ?Multiple Vitamin (MULTIVITAMIN) capsule Take 1 capsule by mouth daily. Unknown strength   Yes [provider]  ?Multiple Vitamins-Minerals (ZINC PO) Take 1 tablet by mouth daily. Unknown strength   Yes [provider]  ?Omega-3 Fatty Acids (FISH OIL) 1000 MG CAPS Take 2 capsules by mouth daily.   Yes [provider]  ?Probiotic Product (PROBIOTIC DAILY PO) Take 1 tablet by mouth daily. Unknown strength   Yes [provider]  ?rosuvastatin (CRESTOR) 5 MG tablet  TAKE 1 TABLET BY MOUTH THREE TIMES A WEEK ?Patient taking differently: Take 5 mg by mouth daily. 03/20/21  Yes Baxley, Cresenciano Lick, MD  ?tirzepatide [facility]) 7.5 MG/0.5ML Pen Inject 7.5 mg into the skin once a week. 04/02/21  Yes Briscoe Deutscher, DO  ?triamterene-hydrochlorothiazide (MAXZIDE-25) 37.5-25 MG tablet Take 1 tablet by mouth every other day. 04/09/21  Yes Park Liter, MD  ?TURMERIC PO Take 1 tablet by mouth daily. Unknown strength   Yes  [provider]  ? ?Past Medical History:  ?Diagnosis Date  ? Anemia   ? Anxiety   ? Anxiety and depression 02/17/2014  ? 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 08/14/2019  ? Dyslipidemia 07/03/2018  ? Fluid retention   ? GERD (gastroesophageal reflux disease)   ? GERD (gastroesophageal reflux disease)   ? Heart murmur   ? infant  ? High cholesterol   ? History of migraine headaches 02/17/2014  ? Hx of adenomatous polyp of colon 07/09/2016  ? Sessile serrated polyp 11/17  ? Hypertension   ? Hypothyroidism 12/03/2015  ? Insomnia 07/30/2016  ? Irregular heart beat   ? Migraines   ? Non-allergic rhinitis 06/10/2017  ? OSA (obstructive sleep apnea) 10/04/2017  ? PID (pelvic inflammatory disease)   ? Reactive airways dysfunction syndrome ([place]) 06/10/2017  ? Recurrent upper respiratory infection (URI)   ? April 2019  ? Sleep apnea   ? Supraventricular tachycardia ([place]) 08/22/2018  ? Thyroid disease   ? Urticaria   ? ?Past Surgical History:  ?Procedure Laterality Date  ? AUGMENTATION MAMMAPLASTY Bilateral 2005  ? BREAST SURGERY  2005  ? Augmentation  ? CARPAL TUNNEL RELEASE Bilateral   ? R in '07 and L '06  ? COLONOSCOPY  2017  ? COSMETIC SURGERY    ? leg lift    ? TONSILLECTOMY  1972  ? TUBAL LIGATION  1989  ? tummy tuck  2005  ? ?Family History  ?Problem Relation Age of Onset  ? Diabetes Mother   ? Uterine cancer Mother   ? Colon polyps Mother   ? Allergic rhinitis Mother   ? Hypertension Mother   ? Thyroid disease Mother   ? Obesity Mother   ? Pancreatic cancer Father   ? Hyperlipidemia Father   ? Hypertension Father   ? Crohn's disease Cousin   ? Diabetes Maternal Grandmother   ? Stroke Maternal Grandmother   ? Diabetes Maternal Grandfather   ? Stroke Paternal Grandmother   ? Colon cancer Neg Hx   ? Stomach cancer Neg Hx   ? Esophageal cancer Neg Hx   ? Rectal cancer Neg Hx   ? Liver cancer Neg Hx   ? Eczema Neg Hx   ? Urticaria Neg Hx   ? Asthma Neg Hx   ? ?Social  History  ? ?Socioeconomic History  ? Marital status: Married  ?  Spouse name: Not on file  ? Number of children: 2  ? Years of education: Not on file  ? Highest education level: Not on file  ?Occupational History  ? Occupation: Garment/textile technologist  ?Tobacco Use  ? Smoking status: Never  ? Smokeless tobacco: Never  ?Vaping Use  ? Vaping Use: Never used  ?Substance and Sexual Activity  ? Alcohol use: Yes  ?  Alcohol/week: 0.0 - 2.0 standard drinks  ?  Comment: 1-2 beer weekly  ? Drug use: No  ? Sexual activity: Yes  ?  Partners:  Male  ?  Birth control/protection: Surgical, Post-menopausal  ?  Comment: INTERCOUSE AGE 54, SEXUAL PARTNERS MORE THAN 5  ?Other Topics Concern  ? Not on file  ?Social History Narrative  ? Not on file  ? ?Social Determinants of Health  ? ?Financial Resource Strain: Not on file  ?Food Insecurity: Not on file  ?Transportation Needs: Not on file  ?Physical Activity: Not on file  ?Stress: Not on file  ?Social Connections: Not on file  ?Intimate Partner Violence: Not on file  ? ?ROS-see HPI   + = positive ?Constitutional:   + weight loss, night sweats, fevers, chills, fatigue, lassitude. ?HEENT:    +headaches, difficulty swallowing, tooth/dental problems, sore throat,  ?    + sneezing, itching, ear ache,+ nasal congestion, post nasal drip, snoring ?CV:    chest pain, orthopnea, PND, swelling in lower extremities, anasarca,                                   ?dizziness, +palpitations ?Resp:   +shortness of breath with exertion or at rest.   ?             productive cough,   non-productive cough, coughing up of blood.   ?           change in color of mucus.  wheezing.   ?Skin:    rash or lesions. ?GI:  No-   heartburn, indigestion, abdominal pain, nausea, vomiting, diarrhea,  ?               change in bowel habits, loss of appetite ?GU: dysuria, change in color of urine, no urgency or frequency.   flank pain. ?MS:   joint pain, stiffness, decreased range of motion, back pain. ?Neuro-      nothing unusual ?Psych:  change in mood or affect.  depression or anxiety.   memory loss. ? ?OBJ- Physical Exam ?General- Alert, Oriented, Affect-appropriate, Distress- none acute ?Skin- rash-none, lesions- none, exc

## 2021-05-17 ENCOUNTER — Other Ambulatory Visit: Payer: Self-pay | Admitting: Internal Medicine

## 2021-05-19 ENCOUNTER — Ambulatory Visit (INDEPENDENT_AMBULATORY_CARE_PROVIDER_SITE_OTHER): Payer: 59 | Admitting: Internal Medicine

## 2021-05-19 ENCOUNTER — Encounter: Payer: Self-pay | Admitting: Internal Medicine

## 2021-05-19 VITALS — BP 100/68 | HR 77 | Temp 98.2°F | Ht 62.0 in | Wt 167.0 lb

## 2021-05-19 DIAGNOSIS — G4733 Obstructive sleep apnea (adult) (pediatric): Secondary | ICD-10-CM

## 2021-05-19 DIAGNOSIS — Z6831 Body mass index (BMI) 31.0-31.9, adult: Secondary | ICD-10-CM

## 2021-05-19 DIAGNOSIS — E661 Drug-induced obesity: Secondary | ICD-10-CM | POA: Diagnosis not present

## 2021-05-19 NOTE — Patient Instructions (Signed)
Order- DME [employer]- please change CPAP auto range to 4-10, mask of choice, humidifier, supplies, AirView/ card.  Please teach to adjust humidifier. ? ?Order- refer for mask fitting/ desensitization ? ?Order- refer to Dr Oneal Grout, Orthodontist   consider oral appliance for OSA ? ? ?

## 2021-05-21 ENCOUNTER — Encounter: Payer: Self-pay | Admitting: Internal Medicine

## 2021-05-21 NOTE — Assessment & Plan Note (Signed)
She reports weight loss working with nutritionist. Encourage further weight loss if able.  ?

## 2021-05-21 NOTE — Assessment & Plan Note (Signed)
I think she can benefit from CPAP with adjustment, but we also discussed alternatives. ?Plan- reduce autopap range to 4-10, consider chin strap. Adjust humidifier. Refer to Dr Ron Parker to consider oral appliance as alternative.  ?

## 2021-06-15 ENCOUNTER — Other Ambulatory Visit: Payer: Self-pay | Admitting: Cardiology

## 2021-06-15 ENCOUNTER — Other Ambulatory Visit: Payer: Self-pay | Admitting: Internal Medicine

## 2021-06-16 NOTE — Telephone Encounter (Signed)
scheduled

## 2021-06-27 ENCOUNTER — Other Ambulatory Visit: Payer: Self-pay | Admitting: Internal Medicine

## 2021-07-14 ENCOUNTER — Other Ambulatory Visit (HOSPITAL_BASED_OUTPATIENT_CLINIC_OR_DEPARTMENT_OTHER): Payer: 59 | Admitting: Internal Medicine

## 2021-07-23 ENCOUNTER — Other Ambulatory Visit: Payer: Self-pay

## 2021-07-23 MED ORDER — ALPRAZOLAM 1 MG PO TABS: 1.0000 mg | ORAL_TABLET | Freq: Two times a day (BID) | ORAL | 1 refills | Status: DC | PRN

## 2021-07-28 ENCOUNTER — Other Ambulatory Visit: Payer: Self-pay | Admitting: Internal Medicine

## 2021-07-28 ENCOUNTER — Ambulatory Visit: Payer: 59 | Admitting: Cardiology

## 2021-07-28 ENCOUNTER — Encounter: Payer: Self-pay | Admitting: Cardiology

## 2021-07-28 VITALS — BP 104/70 | HR 71 | Ht 62.0 in | Wt 169.0 lb

## 2021-07-28 DIAGNOSIS — G4733 Obstructive sleep apnea (adult) (pediatric): Secondary | ICD-10-CM | POA: Diagnosis not present

## 2021-07-28 DIAGNOSIS — Z1231 Encounter for screening mammogram for malignant neoplasm of breast: Secondary | ICD-10-CM

## 2021-07-28 DIAGNOSIS — I10 Essential (primary) hypertension: Secondary | ICD-10-CM | POA: Diagnosis not present

## 2021-07-28 DIAGNOSIS — I471 Supraventricular tachycardia: Secondary | ICD-10-CM

## 2021-07-28 DIAGNOSIS — I420 Dilated cardiomyopathy: Secondary | ICD-10-CM | POA: Diagnosis not present

## 2021-07-28 MED ORDER — LOSARTAN POTASSIUM 25 MG PO TABS
25.0000 mg | ORAL_TABLET | Freq: Two times a day (BID) | ORAL | 3 refills | Status: DC
Start: 2021-07-28 — End: 2022-09-13

## 2021-07-28 NOTE — Patient Instructions (Addendum)
Medication Instructions:  Your physician has recommended you make the following change in your medication: INCREASE: Losartan '25mg'$   1 tablet twice daily by mouth   Lab Work: [place] [address] Your physician recommends that you return for lab work in: 1 week after starting increased dose of Losartan You need to have labs done when you are fasting.  You can come Monday through Friday 8:30 am to 11:30am and 1:15 to 4:30. Thursday and Friday- Morning Hours - ONLY-You do not need to make an appointment as the order has already been placed. The labs you are going to have done are BMET   Testing/Procedures: None Ordered   Follow-Up: At [facility], you and your health needs are our priority.  As part of our continuing mission to provide you with exceptional heart care, we have created designated Provider Care Teams.  These Care Teams include your primary Cardiologist (physician) and Advanced Practice Providers (APPs -  Physician Assistants and Nurse Practitioners) who all work together to provide you with the care you need, when you need it.  We recommend signing up for the patient portal called "MyChart".  Sign up information is provided on this After Visit Summary.  MyChart is used to connect with patients for Virtual Visits (Telemedicine).  Patients are able to view lab/test results, encounter notes, upcoming appointments, etc.  Non-urgent messages can be sent to your provider as well.   To learn more about what you can do with MyChart, go to [email or web address].    Your next appointment:   3 month(s)  The format for your next appointment:   In Person  Provider:   Jenne Campus, MD    Other Instructions NA

## 2021-07-28 NOTE — Progress Notes (Unsigned)
Cardiology Office Note:    Date:  07/28/2021   ID:  Rebecca Mccoy, DOB 1965-10-15, MRN [id number]  PCP:  Elby Showers, MD  Cardiologist:  Jenne Campus, MD    Referring MD: Elby Showers, MD   No chief complaint on file. Doing well  History of Present Illness:    Rebecca Mccoy is a 56 y.o. female with past medical history significant for nonischemic cardiomyopathy with ejection fraction in the neighborhood of 45%, coronary artery disease only mild coronary CT angio showed only 0 to 25% stenosis, supraventricular tachycardia, dyspnea on exertion, palpitations. She is in my office today doing much better she says she got much more energy she can work and weed her garden with no stopping before she had to stop at least once.  Overall she is doing better and trying to put on the right medications there is some difficulty because of low blood pressure.  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 08/14/2019   Dyslipidemia 07/03/2018   Fluid retention    GERD (gastroesophageal reflux disease)    GERD (gastroesophageal reflux disease)    Heart murmur    infant   High cholesterol    History of migraine headaches 02/17/2014   Hx of adenomatous polyp of colon 07/09/2016   Sessile serrated polyp 11/17   Hypertension    Hypothyroidism 12/03/2015   Insomnia 07/30/2016   Irregular heart beat    Migraines    Non-allergic rhinitis 06/10/2017   OSA (obstructive sleep apnea) 10/04/2017   PID (pelvic inflammatory disease)    Reactive airways dysfunction syndrome ([place]) 06/10/2017   Recurrent upper respiratory infection (URI)    April 2019   Sleep apnea    Supraventricular tachycardia ([place]) 08/22/2018   Thyroid disease    Urticaria     Past Surgical History:  Procedure Laterality Date   AUGMENTATION MAMMAPLASTY Bilateral 2005   BREAST SURGERY   2005   Augmentation   CARPAL TUNNEL RELEASE Bilateral    R in '07 and L '06   COLONOSCOPY  2017   COSMETIC SURGERY     leg lift     [place]   tummy tuck  2005    Current Medications: Current Meds  Medication Sig   albuterol (VENTOLIN HFA) 108 (90 Base) MCG/ACT inhaler Inhale 1-2 puffs into the lungs every 6 (six) hours as needed for wheezing or shortness of breath.   ALPRAZolam (XANAX) 1 MG tablet Take 1 tablet (1 mg total) by mouth 2 (two) times daily as needed. for anxiety (Patient taking differently: Take 1 mg by mouth 2 (two) times daily as needed for anxiety or sleep. for anxiety)   Ascorbic Acid (VITAMIN C PO) Take 1 tablet by mouth daily. Unknown strength   aspirin EC 81 MG tablet Take 1 tablet (81 mg total) by mouth daily. Swallow whole.   B Complex Vitamins (B COMPLEX 100 PO) Take 1 capsule by mouth daily. Unknown strength   budesonide-formoterol (SYMBICORT) 160-4.5 MCG/ACT inhaler Inhale 2 puffs into the lungs 2 (two) times daily.   Ergocalciferol (VITAMIN D2) 50 MCG (2000 UT) TABS Take 1 tablet by mouth daily.   ferrous sulfate 325 (65 FE) MG tablet Take 325 mg by mouth 2 (two) times daily with a meal.  glucosamine-chondroitin 500-400 MG tablet Take 1 tablet by mouth daily.   levothyroxine (SYNTHROID) 125 MCG tablet Take 1 tablet by mouth once daily (Patient taking differently: Take 125 mcg by mouth daily before breakfast.)   losartan (COZAAR) 25 MG tablet Take 1 tablet by mouth once daily (Patient taking differently: Take 25 mg by mouth daily.)   metoprolol succinate (TOPROL-XL) 50 MG 24 hr tablet Take 1 tablet (50 mg total) by mouth daily. Take with or immediately following a meal. (Patient taking differently: Take 25 mg by mouth daily. Take with or immediately following a meal.)   Multiple Vitamin (MULTIVITAMIN) capsule Take 1 capsule by mouth daily. Unknown strength   Multiple Vitamins-Minerals (ZINC PO) Take 1 tablet by mouth daily.  Unknown strength   Omega-3 Fatty Acids (FISH OIL) 1000 MG CAPS Take 2 capsules by mouth daily.   Probiotic Product (PROBIOTIC DAILY PO) Take 1 tablet by mouth daily. Unknown strength   rosuvastatin (CRESTOR) 5 MG tablet TAKE 1 TABLET BY MOUTH THREE TIMES A WEEK (Patient taking differently: Take 5 mg by mouth daily.)   tirzepatide (MOUNJARO) 7.5 MG/0.5ML Pen Inject 7.5 mg into the skin once a week.   triamterene-hydrochlorothiazide (MAXZIDE-25) 37.5-25 MG tablet Take 1 tablet by mouth every other day.   TURMERIC PO Take 1 tablet by mouth daily. Unknown strength     Allergies:   Patient has no known allergies.   Social History   Socioeconomic History   Marital status: Married    Spouse name: Not on file   Number of children: 2   Years of education: Not on file   Highest education level: Not on file  Occupational History   Occupation: Operations Support Specialist  Tobacco Use   Smoking status: Never   Smokeless tobacco: Never  Vaping Use   Vaping Use: Never used  Substance and Sexual Activity   Alcohol use: Yes    Alcohol/week: 0.0 - 2.0 standard drinks of alcohol    Comment: 1-2 beer weekly   Drug use: No   Sexual activity: Yes    Partners: Male    Birth control/protection: Surgical, Post-menopausal    Comment: INTERCOUSE AGE 53, SEXUAL PARTNERS MORE THAN 5  Other Topics Concern   Not on file  Social History Narrative   Not on file   Social Determinants of Health   Financial Resource Strain: Not on file  Food Insecurity: Not on file  Transportation Needs: Not on file  Physical Activity: Not on file  Stress: Not on file  Social Connections: Not on file     Family History: The patient's family history includes Allergic rhinitis in her mother; Colon polyps in her mother; Crohn's disease in her cousin; Diabetes in her maternal grandfather, maternal grandmother, and mother; Hyperlipidemia in her father; Hypertension in her father and mother; Obesity in her mother;  Pancreatic cancer in her father; Stroke in her maternal grandmother and paternal grandmother; Thyroid disease in her mother; Uterine cancer in her mother. There is no history of Colon cancer, Stomach cancer, Esophageal cancer, Rectal cancer, Liver cancer, Eczema, Urticaria, or Asthma. ROS:   Please see the history of present illness.    All 14 point review of systems negative except as described per history of present illness  EKGs/Labs/Other Studies Reviewed:      Recent Labs: 09/05/2020: ALT 13; TSH 0.82 10/21/2020: Hemoglobin 13.4; Platelets 333 02/06/2021: BUN 13; Creatinine, Ser 1.00; Potassium 3.9; Sodium 140  Recent Lipid Panel    Component Value Date/Time  CHOL 188 09/05/2020 0921   TRIG 125 09/05/2020 0921   HDL 87 09/05/2020 0921   CHOLHDL 2.2 09/05/2020 0921   VLDL 22 07/26/2016 0914   LDLCALC 78 09/05/2020 0921    Physical Exam:    VS:  BP 104/70 (BP Location: Left Arm, Patient Position: Sitting)   Pulse 71   Ht '5\' 2"'$  (1.575 m)   Wt 169 lb (76.7 kg)   LMP  (LMP Unknown)   SpO2 96%   BMI 30.91 kg/m     Wt Readings from Last 3 Encounters:  07/28/21 169 lb (76.7 kg)  05/19/21 167 lb (75.8 kg)  04/09/21 166 lb (75.3 kg)     GEN:  Well nourished, well developed in no acute distress HEENT: Normal NECK: No JVD; No carotid bruits LYMPHATICS: No lymphadenopathy CARDIAC: RRR, no murmurs, no rubs, no gallops RESPIRATORY:  Clear to auscultation without rales, wheezing or rhonchi  ABDOMEN: Soft, non-tender, non-distended MUSCULOSKELETAL:  No edema; No deformity  SKIN: Warm and dry LOWER EXTREMITIES: no swelling NEUROLOGIC:  Alert and oriented x 3 PSYCHIATRIC:  Normal affect   ASSESSMENT:    1. Supraventricular tachycardia ([place])   2. Dilated cardiomyopathy ([place])   3. Primary hypertension   4. OSA (obstructive sleep apnea)    PLAN:    In order of problems listed above:  Cardiomyopathy which is nonischemic.  She is on beta-blocker she is also on ARB I will  increase dose of Cozaar to 25 twice daily we will check Chem-7 next week.  If everything is fine then we will try to switch him to [facility]. Supraventricular tachycardia denies having any, she is on beta-blocker which I will continue. Essential hypertension blood pressure well controlled continue present management actually decreased blood pressure being low. Obstructive sleep apnea followed by internal medicine team. Overall she is feeling much better clinically.  I tried to put her on the right medication which is difficult because of low blood pressure.   Medication Adjustments/Labs and Tests Ordered: Current medicines are reviewed at length with the patient today.  Concerns regarding medicines are outlined above.  No orders of the defined types were placed in this encounter.  Medication changes: No orders of the defined types were placed in this encounter.   Signed, Park Liter, MD, [facility] 07/28/2021 10:53 AM    [place]

## 2021-07-29 ENCOUNTER — Ambulatory Visit
Admission: RE | Admit: 2021-07-29 | Discharge: 2021-07-29 | Disposition: A | Discharge status: Home / Self Care | Payer: 59 | Source: Ambulatory Visit

## 2021-07-29 DIAGNOSIS — Z1231 Encounter for screening mammogram for malignant neoplasm of breast: Secondary | ICD-10-CM

## 2021-08-04 LAB — BASIC METABOLIC PANEL
BUN/Creatinine Ratio: 16 (ref 9–23)
BUN: 16 mg/dL (ref 6–24)
CO2: 22 mmol/L (ref 20–29)
Calcium: 9.8 mg/dL (ref 8.7–10.2)
Chloride: 100 mmol/L (ref 96–106)
Creatinine, Ser: 0.99 mg/dL (ref 0.57–1.00)
Glucose: 98 mg/dL (ref 70–99)
Potassium: 3.9 mmol/L (ref 3.5–5.2)
Sodium: 141 mmol/L (ref 134–144)
eGFR: 67 mL/min/{1.73_m2} (ref >59)

## 2021-08-25 ENCOUNTER — Ambulatory Visit: Payer: 59 | Admitting: Internal Medicine

## 2021-08-26 ENCOUNTER — Encounter (INDEPENDENT_AMBULATORY_CARE_PROVIDER_SITE_OTHER): Payer: Self-pay

## 2021-09-01 ENCOUNTER — Other Ambulatory Visit (HOSPITAL_COMMUNITY): Payer: Self-pay

## 2021-09-02 ENCOUNTER — Other Ambulatory Visit (HOSPITAL_COMMUNITY): Payer: Self-pay

## 2021-09-02 MED ORDER — SEMAGLUTIDE-WEIGHT MANAGEMENT 0.25 MG/0.5ML ~~LOC~~ SOAJ
SUBCUTANEOUS | 0 refills | Status: DC
  Filled 2021-09-02: qty 2, 28d supply, fill #0

## 2021-09-11 ENCOUNTER — Other Ambulatory Visit: Payer: Self-pay | Admitting: Internal Medicine

## 2021-09-21 ENCOUNTER — Encounter: Payer: Self-pay | Admitting: Internal Medicine

## 2021-09-25 ENCOUNTER — Other Ambulatory Visit: Payer: 59

## 2021-09-25 DIAGNOSIS — E782 Mixed hyperlipidemia: Secondary | ICD-10-CM

## 2021-09-25 DIAGNOSIS — E038 Other specified hypothyroidism: Secondary | ICD-10-CM

## 2021-09-25 DIAGNOSIS — I10 Essential (primary) hypertension: Secondary | ICD-10-CM

## 2021-09-25 DIAGNOSIS — R7302 Impaired glucose tolerance (oral): Secondary | ICD-10-CM

## 2021-09-25 DIAGNOSIS — Z8639 Personal history of other endocrine, nutritional and metabolic disease: Secondary | ICD-10-CM

## 2021-09-26 LAB — CBC WITH DIFFERENTIAL/PLATELET
Absolute Monocytes: 370 cells/uL (ref 200–950)
Basophils Absolute: 42 cells/uL (ref 0–200)
Basophils Relative: 1 %
Eosinophils Absolute: 71 cells/uL (ref 15–500)
Eosinophils Relative: 1.7 %
HCT: 38.5 % (ref 35.0–45.0)
Hemoglobin: 13 g/dL (ref 11.7–15.5)
Lymphs Abs: 1676 cells/uL (ref 850–3900)
MCH: 32.1 pg (ref 27.0–33.0)
MCHC: 33.8 g/dL (ref 32.0–36.0)
MCV: 95.1 fL (ref 80.0–100.0)
MPV: 9.5 fL (ref 7.5–12.5)
Monocytes Relative: 8.8 %
Neutro Abs: 2041 cells/uL (ref 1500–7800)
Neutrophils Relative %: 48.6 %
Platelets: 273 10*3/uL (ref 140–400)
RBC: 4.05 10*6/uL (ref 3.80–5.10)
RDW: 12.4 % (ref 11.0–15.0)
Total Lymphocyte: 39.9 %
WBC: 4.2 10*3/uL (ref 3.8–10.8)

## 2021-09-26 LAB — LIPID PANEL
Cholesterol: 193 mg/dL (ref <200)
HDL: 71 mg/dL (ref >=50)
LDL Cholesterol (Calc): 94 mg/dL (calc)
Non-HDL Cholesterol (Calc): 122 mg/dL (calc) (ref <130)
Total CHOL/HDL Ratio: 2.7 (calc) (ref <5.0)
Triglycerides: 182 mg/dL — ABNORMAL HIGH (ref <150)

## 2021-09-26 LAB — COMPLETE METABOLIC PANEL WITH GFR
AG Ratio: 1.8 (calc) (ref 1.0–2.5)
ALT: 20 U/L (ref 6–29)
AST: 20 U/L (ref 10–35)
Albumin: 4.5 g/dL (ref 3.6–5.1)
Alkaline phosphatase (APISO): 68 U/L (ref 37–153)
BUN: 13 mg/dL (ref 7–25)
CO2: 32 mmol/L (ref 20–32)
Calcium: 10.4 mg/dL (ref 8.6–10.4)
Chloride: 102 mmol/L (ref 98–110)
Creat: 1.01 mg/dL (ref 0.50–1.03)
Globulin: 2.5 g/dL (calc) (ref 1.9–3.7)
Glucose, Bld: 89 mg/dL (ref 65–99)
Potassium: 4.9 mmol/L (ref 3.5–5.3)
Sodium: 142 mmol/L (ref 135–146)
Total Bilirubin: 0.8 mg/dL (ref 0.2–1.2)
Total Protein: 7 g/dL (ref 6.1–8.1)
eGFR: 65 mL/min/{1.73_m2} (ref >=60)

## 2021-09-26 LAB — IRON,TIBC AND FERRITIN PANEL
%SAT: 23 % (calc) (ref 16–45)
Ferritin: 62 ng/mL (ref 16–232)
Iron: 90 ug/dL (ref 45–160)
TIBC: 387 mcg/dL (calc) (ref 250–450)

## 2021-09-26 LAB — HEMOGLOBIN A1C
Hgb A1c MFr Bld: 5.1 % of total Hgb (ref <5.7)
Mean Plasma Glucose: 100 mg/dL
eAG (mmol/L): 5.5 mmol/L

## 2021-09-26 LAB — TSH: TSH: 3.44 mIU/L (ref 0.40–4.50)

## 2021-09-26 LAB — MICROALBUMIN, URINE: Microalb, Ur: 0.3 mg/dL

## 2021-09-30 ENCOUNTER — Other Ambulatory Visit (HOSPITAL_COMMUNITY): Payer: Self-pay

## 2021-09-30 ENCOUNTER — Encounter: Payer: Self-pay | Admitting: Internal Medicine

## 2021-09-30 ENCOUNTER — Ambulatory Visit (INDEPENDENT_AMBULATORY_CARE_PROVIDER_SITE_OTHER): Payer: 59 | Admitting: Internal Medicine

## 2021-09-30 VITALS — BP 110/70 | HR 73 | Temp 97.9°F | Ht 62.0 in | Wt 179.4 lb

## 2021-09-30 DIAGNOSIS — I10 Essential (primary) hypertension: Secondary | ICD-10-CM

## 2021-09-30 DIAGNOSIS — G4733 Obstructive sleep apnea (adult) (pediatric): Secondary | ICD-10-CM | POA: Diagnosis not present

## 2021-09-30 DIAGNOSIS — R7302 Impaired glucose tolerance (oral): Secondary | ICD-10-CM

## 2021-09-30 DIAGNOSIS — Z9989 Dependence on other enabling machines and devices: Secondary | ICD-10-CM

## 2021-09-30 DIAGNOSIS — Z0000 Encounter for general adult medical examination without abnormal findings: Secondary | ICD-10-CM | POA: Diagnosis not present

## 2021-09-30 DIAGNOSIS — E782 Mixed hyperlipidemia: Secondary | ICD-10-CM

## 2021-09-30 DIAGNOSIS — I25119 Atherosclerotic heart disease of native coronary artery with unspecified angina pectoris: Secondary | ICD-10-CM

## 2021-09-30 DIAGNOSIS — Z780 Asymptomatic menopausal state: Secondary | ICD-10-CM | POA: Diagnosis not present

## 2021-09-30 DIAGNOSIS — E039 Hypothyroidism, unspecified: Secondary | ICD-10-CM

## 2021-09-30 DIAGNOSIS — Z6832 Body mass index (BMI) 32.0-32.9, adult: Secondary | ICD-10-CM

## 2021-09-30 DIAGNOSIS — Z8639 Personal history of other endocrine, nutritional and metabolic disease: Secondary | ICD-10-CM

## 2021-09-30 LAB — POCT URINALYSIS DIPSTICK
Bilirubin, UA: NEGATIVE
Blood, UA: NEGATIVE
Glucose, UA: NEGATIVE
Ketones, UA: NEGATIVE
Leukocytes, UA: NEGATIVE
Nitrite, UA: NEGATIVE
Protein, UA: NEGATIVE
Spec Grav, UA: 1.01
Urobilinogen, UA: 0.2 U/dL
pH, UA: 7

## 2021-09-30 MED ORDER — LEVOTHYROXINE SODIUM 137 MCG PO TABS: 137.0000 ug | ORAL_TABLET | Freq: Every day | ORAL | 0 refills | Status: DC

## 2021-09-30 MED ORDER — WEGOVY 0.5 MG/0.5ML ~~LOC~~ SOAJ
0.5000 mL | SUBCUTANEOUS | 0 refills | Status: DC
  Filled 2021-09-30: qty 2, 28d supply, fill #0

## 2021-09-30 NOTE — Progress Notes (Signed)
Subjective:    Patient ID: Rebecca Mccoy, female    DOB: [birth year], 56 y.o.   MRN: [id number]  HPI 56 year old Female seen for health maintenance exam and evaluation of medical issues.   Hx of obstructive sleep apnea. Was having problems with  old sleep apnea device.Wearing new device 6-7 hours a night. Seeing  sleep disorder physician through [facility]  Had mammogram in July. Pap smear scheduled for October. Does not want to take additional Covid vaccines. Patient is worried about getting cardiovascular disease from Covid vaccine.Thinks Moderna vaccines may have caused some CV issues. Did fine with J and J vaccine. Will get flu vaccine later. This was discussed. Pneumococcal vaccine discussed. Shingrix vaccines up to date.RSV vaccine discussed.  Currently attending [facility] and is on Wegovy through Dr. Briscoe Deutscher.  Previously tried [employer].  Her starting weight was 183 pounds in March 2021.  She now weighs 179 pounds.  Had COVID-19 in the Fall 2021 and again in January 2023.  Did have some issues with iron deficiency in 2022.  Had colonoscopy in December 2017.  Had repeat colonoscopy in November 2022.  Follow-up recommended in 10 years.  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  History of chondromalacia right knee in 2014.  Cellulitis right thigh 2014.  History of right distal phalanx second toe fracture.  History of bacterial vaginosis and Candida vaginitis.  Had negative exercise tolerance test in 2018 for chest pain.  Sclerotherapy for varicose veins in 2004.  History of allergic rhinitis, GE reflux, history of bilateral carpal tunnel syndrome status postsurgical release.  History of hypertension.  History of menorrhagia due to fibroids in 2006.  IUD inserted in 2008.  Sebaceous cyst on her back removed 2008.  History of syncope in 2008 referred for echocardiogram and event monitor.  Had no subsequent issues with syncope.  Tonsillectomy  1972.  Bilateral tubal ligation 1979.  Abdominoplasty 2008.  Breast augmentation 2010.  Right carpal tunnel release February 2010.  Left carpal tunnel release March 2011.  Mirena IUD placed 2013.  History of anxiety disorder previously seen at [facility] [place] and which is currently stable and not an issue.  History of palpitations.  No known drug allergies.  Patient says Crestor caused myalgias.  History of familial hyperlipidemia.  In 2020 she was referred to cardiology for substernal chest pain with history of tachycardia in the past.  Holter monitor for 5 days showed infrequent PVCs and infrequent premature supraventricular beats with 3 runs of narrow complex tachycardia.  Longest episode was 13 beats at 162 bpm.  Had normal gated myocardial perfusion study in 2020.  Had coronary calcium scoring in January 2023.  Total score was 108.  SHx: Married twice.  Divorced once.  First husband was with the [employer].  1 adult daughter from that marriage.  Current husband retired from [employer].  She completed 2 years of college.  She formerly worked for [employer].  She has an adult daughter.    Social alcohol consumption.   Review of Systems history of SVT seen by Dr. Agustin Cree. Had coronary calcium score 108. Thinks metoprolol is  causing weight gain.  This was started in August 2020 by cardiologist for tachycardia and atypical chest pain.     Objective:   Physical Exam  BP 110/70 pulse 73 T 97.3 pulse ox 98%, BMI 32.81 Skin: Warm and dry.  Has several  tattoos.  No cervical adenopathy.  No thyromegaly or carotid bruits.  Chest clear.  Cardiac exam: Regular rate and rhythm today without ectopy.  Abdomen soft nondistended without hepatosplenomegaly masses or tenderness.  Pelvic exam deferred.  No lower extremity pitting edema.  Brief neurological exam intact without gross focal deficits.  Affect thought and judgment appear to  be normal.  GYN exam deferred to Dr. Talbert Nan     Assessment & Plan:  Hypothyroidism-TSH is 3.44.  Increase levothyroxine to 137 mcg daily and follow-up with TSH in late October with office visit in early November.  History of recurrent MRSA infections-none recently  Has had COVID-19 twice and does not want to use.  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  Impaired glucose tolerance treated with ATFTDD.  This also helps weight loss.  History of lower extremity edema treated with Maxide and has been refilled by cardiologist  History of palpitations treated with metoprolol  History of iron deficiency anemia-colonoscopy was negative.  Current iron level is within normal limits.  Plan: Thyroid medication adjusted and she will follow-up in early November.

## 2021-09-30 NOTE — Patient Instructions (Signed)
It was a pleasure to see you today.  Thyroid replacement medication adjusted upward.  Return in early November for follow-up.  Continue other medications as previously prescribed.  Vaccines discussed with patient not inclined to take COVID vaccines.

## 2021-10-08 ENCOUNTER — Other Ambulatory Visit (HOSPITAL_COMMUNITY): Payer: Self-pay

## 2021-10-12 ENCOUNTER — Other Ambulatory Visit: Payer: Self-pay | Admitting: Internal Medicine

## 2021-10-22 ENCOUNTER — Ambulatory Visit: Payer: 59 | Attending: Cardiology | Admitting: Cardiology

## 2021-10-22 ENCOUNTER — Encounter: Payer: Self-pay | Admitting: Cardiology

## 2021-10-22 VITALS — BP 120/74 | HR 91 | Ht 62.0 in | Wt 177.0 lb

## 2021-10-22 DIAGNOSIS — I420 Dilated cardiomyopathy: Secondary | ICD-10-CM

## 2021-10-22 DIAGNOSIS — I10 Essential (primary) hypertension: Secondary | ICD-10-CM

## 2021-10-22 DIAGNOSIS — R0609 Other forms of dyspnea: Secondary | ICD-10-CM

## 2021-10-22 DIAGNOSIS — I471 Supraventricular tachycardia, unspecified: Secondary | ICD-10-CM | POA: Diagnosis not present

## 2021-10-22 DIAGNOSIS — E785 Hyperlipidemia, unspecified: Secondary | ICD-10-CM

## 2021-10-22 NOTE — Progress Notes (Signed)
Cardiology Office Note:    Date:  10/22/2021   ID:  Rebecca Mccoy, DOB [birth year], MRN [id number]  PCP:  Elby Showers, MD  Cardiologist:  Jenne Campus, MD    Referring MD: Elby Showers, MD   Chief Complaint  Patient presents with   Medication Management    Losartan- may be causing headaches     History of Present Illness:    Rebecca Mccoy is a 56 y.o. female with past medical history significant for nonischemic cardiomyopathy ejection fraction 45%, coronary disease but only mild coronary artery disease showing 0 to 25% stenosis on coronary CT angio, supraventricular tachycardia, dyspnea exertion, palpitations. She is in my office today for follow-up.  Overall she is doing very well.  She denies of any chest pain tightness squeezing pressure burning chest.  She does describe however have some headache after she takes second dose of losartan at evening time.  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 08/14/2019   Dyslipidemia 07/03/2018   Fluid retention    GERD (gastroesophageal reflux disease)    GERD (gastroesophageal reflux disease)    Heart murmur    infant   High cholesterol    History of migraine headaches 02/17/2014   Hx of adenomatous polyp of colon 07/09/2016   Sessile serrated polyp 11/17   Hypertension    Hypothyroidism 12/03/2015   Insomnia 07/30/2016   Irregular heart beat    Migraines    Non-allergic rhinitis 06/10/2017   OSA (obstructive sleep apnea) 10/04/2017   PID (pelvic inflammatory disease)    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   Recurrent upper respiratory infection (URI)    April 2019   Sleep apnea    Supraventricular tachycardia 08/22/2018   Thyroid disease    Urticaria     Past Surgical History:  Procedure Laterality Date   AUGMENTATION MAMMAPLASTY Bilateral 2005   BREAST  SURGERY  2005   Augmentation   CARPAL TUNNEL RELEASE Bilateral    R in '07 and L '06   COLONOSCOPY  2017   COSMETIC SURGERY     leg lift     [place]   tummy tuck  2005    Current Medications: Current Meds  Medication Sig   albuterol (VENTOLIN HFA) 108 (90 Base) MCG/ACT inhaler Inhale 1-2 puffs into the lungs every 6 (six) hours as needed for wheezing or shortness of breath.   ALPRAZolam (XANAX) 1 MG tablet Take 1 tablet (1 mg total) by mouth 2 (two) times daily as needed. for anxiety (Patient taking differently: Take 1 mg by mouth 2 (two) times daily as needed for anxiety or sleep. for anxiety)   Ascorbic Acid (VITAMIN C PO) Take 1 tablet by mouth daily. Unknown strength   aspirin EC 81 MG tablet Take 1 tablet (81 mg total) by mouth daily. Swallow whole.   B Complex Vitamins (B COMPLEX 100 PO) Take 1 capsule by mouth daily. Unknown strength   budesonide-formoterol (SYMBICORT) 160-4.5 MCG/ACT inhaler Inhale 2 puffs into the lungs 2 (two) times daily.   ferrous sulfate 325 (65 FE) MG tablet Take 325 mg by mouth 2 (two) times daily with a meal.   glucosamine-chondroitin 500-400 MG tablet Take 1 tablet by mouth daily.   levothyroxine (SYNTHROID) 137 MCG tablet Take  1 tablet (137 mcg total) by mouth daily before breakfast.   losartan (COZAAR) 25 MG tablet Take 1 tablet (25 mg total) by mouth 2 (two) times daily.   metoprolol succinate (TOPROL-XL) 50 MG 24 hr tablet Take 1 tablet (50 mg total) by mouth daily. Take with or immediately following a meal. (Patient taking differently: Take 25 mg by mouth daily. Take with or immediately following a meal.)   Multiple Vitamin (MULTIVITAMIN) capsule Take 1 capsule by mouth daily. Unknown strength   Omega-3 Fatty Acids (FISH OIL) 1000 MG CAPS Take 2 capsules by mouth daily.   Probiotic Product (PROBIOTIC DAILY PO) Take 1 tablet by mouth daily. Unknown strength   rosuvastatin (CRESTOR) 5 MG tablet TAKE 1 TABLET BY MOUTH  THREE TIMES A WEEK (Patient taking differently: Take 5 mg by mouth 3 (three) times a week.)   Semaglutide-Weight Management (WEGOVY) 0.5 MG/0.5ML SOAJ Inject 0.5 mg into the skin once a week.   Semaglutide-Weight Management 0.25 MG/0.5ML SOAJ Inject 0.25 mg into the skin weekly. (Patient taking differently: Inject 0.25 mg into the skin once a week.)   triamterene-hydrochlorothiazide (MAXZIDE-25) 37.5-25 MG tablet Take 1 tablet by mouth every other day. (Patient taking differently: Take 0.5 tablets by mouth daily.)   TURMERIC PO Take 1 tablet by mouth daily. Unknown strength   Zinc Sulfate (ZINC 15 PO) Take 15 mg by mouth daily.   [DISCONTINUED] tirzepatide (MOUNJARO) 7.5 MG/0.5ML Pen Inject 7.5 mg into the skin once a week.     Allergies:   Patient has no known allergies.   Social History   Socioeconomic History   Marital status: Married    Spouse name: Not on file   Number of children: 2   Years of education: Not on file   Highest education level: Not on file  Occupational History   Occupation: Operations Support Specialist  Tobacco Use   Smoking status: Never   Smokeless tobacco: Never  Vaping Use   Vaping Use: Never used  Substance and Sexual Activity   Alcohol use: Yes    Alcohol/week: 0.0 - 2.0 standard drinks of alcohol    Comment: 1-2 beer weekly   Drug use: No   Sexual activity: Yes    Partners: Male    Birth control/protection: Surgical, Post-menopausal    Comment: INTERCOUSE AGE 27, SEXUAL PARTNERS MORE THAN 5  Other Topics Concern   Not on file  Social History Narrative   Not on file   Social Determinants of Health   Financial Resource Strain: Not on file  Food Insecurity: Not on file  Transportation Needs: Not on file  Physical Activity: Not on file  Stress: Not on file  Social Connections: Not on file     Family History: The patient's family history includes Allergic rhinitis in her mother; Colon polyps in her mother; Crohn's disease in her cousin;  Diabetes in her maternal grandfather, maternal grandmother, and mother; Hyperlipidemia in her father; Hypertension in her father and mother; Obesity in her mother; Pancreatic cancer in her father; Stroke in her maternal grandmother and paternal grandmother; Thyroid disease in her mother; Uterine cancer in her mother. There is no history of Colon cancer, Stomach cancer, Esophageal cancer, Rectal cancer, Liver cancer, Eczema, Urticaria, or Asthma. ROS:   Please see the history of present illness.    All 14 point review of systems negative except as described per history of present illness  EKGs/Labs/Other Studies Reviewed:      Recent Labs: 09/25/2021: ALT 20; BUN 13;  Creat 1.01; Hemoglobin 13.0; Platelets 273; Potassium 4.9; Sodium 142; TSH 3.44  Recent Lipid Panel    Component Value Date/Time   CHOL 193 09/25/2021 1147   TRIG 182 (H) 09/25/2021 1147   HDL 71 09/25/2021 1147   CHOLHDL 2.7 09/25/2021 1147   VLDL 22 07/26/2016 0914   LDLCALC 94 09/25/2021 1147    Physical Exam:    VS:  BP 120/74   Pulse 91   Ht '5\' 2"'$  (1.575 m)   Wt 177 lb (80.3 kg)   LMP  (LMP Unknown)   SpO2 94%   BMI 32.37 kg/m     Wt Readings from Last 3 Encounters:  10/22/21 177 lb (80.3 kg)  09/30/21 179 lb 6.4 oz (81.4 kg)  07/28/21 169 lb (76.7 kg)     GEN:  Well nourished, well developed in no acute distress HEENT: Normal NECK: No JVD; No carotid bruits LYMPHATICS: No lymphadenopathy CARDIAC: RRR, no murmurs, no rubs, no gallops RESPIRATORY:  Clear to auscultation without rales, wheezing or rhonchi  ABDOMEN: Soft, non-tender, non-distended MUSCULOSKELETAL:  No edema; No deformity  SKIN: Warm and dry LOWER EXTREMITIES: no swelling NEUROLOGIC:  Alert and oriented x 3 PSYCHIATRIC:  Normal affect   ASSESSMENT:    1. Supraventricular tachycardia   2. Dilated cardiomyopathy ([place])   3. Primary hypertension   4. Dyslipidemia    PLAN:    In order of problems listed above:  Cardiomyopathy.  I  will ask her to have echocardiogram repeated, if echocardiogram showed normalization of left ventricle ejection fraction will decrease dose of losartan to only once a day, if ejection fraction is unchanged or worsen then we will switch her to [facility] 2426 twice daily.  In the meantime we will continue with beta-blocker. Supraventricular tachycardia controlled with present medication denies have any palpitation continue present management. Dyslipidemia I did review her K PN which show me LDL 94 HDL 71.  She is already on Crestor 5 mg daily which I will continue.   Medication Adjustments/Labs and Tests Ordered: Current medicines are reviewed at length with the patient today.  Concerns regarding medicines are outlined above.  No orders of the defined types were placed in this encounter.  Medication changes: No orders of the defined types were placed in this encounter.   Signed, Park Liter, MD, [facility] 10/22/2021 11:22 AM    [place]

## 2021-10-22 NOTE — Progress Notes (Signed)
56 y.o. G68P2002 Married White or Caucasian Not Hispanic or Latino female here for annual exam.  No vaginal bleeding. No dyspareunia.   No bowel or bladder c/o.   She feels like she is having mild mood swings, gets down. Symptoms started a month ago. Overall she is okay. Feels her medications may be effecting her.   She has a CPAP, setting were adjusted a few weeks ago, helping her sleep better.      No LMP recorded (lmp unknown). Patient is postmenopausal.          Sexually active: Yes.    The current method of family planning is post menopausal status.    Exercising: Yes.     Walking  Smoker:  no  Health Maintenance: Pap:  09/26/19 WNL, neg HR HPV; 01/30/16 WNL  History of abnormal Pap:  no MMG:  07/30/21 density B Bi-rads 1 neg  BMD:   n/a Colonoscopy: 11/28/20 f/u 10 years  TDaP:  12/10/16 Gardasil: n/a   reports that she has never smoked. She has never used smokeless tobacco. She reports current alcohol use. She reports that she does not use drugs. 1 drink a week. 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 She has 2 kids, one granddaughter (50 years old).  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 08/14/2019   Dyslipidemia 07/03/2018   Fluid retention    GERD (gastroesophageal reflux disease)    GERD (gastroesophageal reflux disease)    Heart murmur    infant   High cholesterol    History of migraine headaches 02/17/2014   Hx of adenomatous polyp of colon 07/09/2016   Sessile serrated polyp 11/17   Hypertension    Hypothyroidism 12/03/2015   Insomnia 07/30/2016   Irregular heart beat    Migraines    Non-allergic rhinitis 06/10/2017   OSA (obstructive sleep apnea) 10/04/2017   PID (pelvic inflammatory disease)    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   Recurrent  upper respiratory infection (URI)    April 2019   Sleep apnea    Supraventricular tachycardia 08/22/2018   Thyroid disease    Urticaria     Past Surgical History:  Procedure Laterality Date   AUGMENTATION MAMMAPLASTY Bilateral 2005   BREAST SURGERY  2005   Augmentation   CARPAL TUNNEL RELEASE Bilateral    R in '07 and L '06   COLONOSCOPY  2017   COSMETIC SURGERY     leg lift     TONSILLECTOMY  1972   TUBAL LIGATION  1989   tummy tuck  2005    Current Outpatient Medications  Medication Sig Dispense Refill   albuterol (VENTOLIN HFA) 108 (90 Base) MCG/ACT inhaler Inhale 1-2 puffs into the lungs every 6 (six) hours as needed for wheezing or shortness of breath. 54 g 3   ALPRAZolam (XANAX) 1 MG tablet Take 1 tablet (1 mg total) by mouth 2 (two) times daily as needed. for anxiety (Patient taking differently: Take 1 mg by mouth 2 (two) times daily as needed for anxiety or sleep. for anxiety) 90 tablet 1   Ascorbic Acid (VITAMIN C PO) Take 1 tablet by mouth daily. Unknown strength     aspirin EC 81 MG tablet Take 1 tablet (81 mg total) by mouth daily. Swallow whole. 90 tablet  3   B Complex Vitamins (B COMPLEX 100 PO) Take 1 capsule by mouth daily. Unknown strength     budesonide-formoterol (SYMBICORT) 160-4.5 MCG/ACT inhaler Inhale 2 puffs into the lungs 2 (two) times daily. 30.6 g 3   ferrous sulfate 325 (65 FE) MG tablet Take 325 mg by mouth 2 (two) times daily with a meal.     glucosamine-chondroitin 500-400 MG tablet Take 1 tablet by mouth daily.     levothyroxine (SYNTHROID) 137 MCG tablet Take 1 tablet (137 mcg total) by mouth daily before breakfast. 90 tablet 0   losartan (COZAAR) 25 MG tablet Take 1 tablet (25 mg total) by mouth 2 (two) times daily. 180 tablet 3   metoprolol succinate (TOPROL-XL) 50 MG 24 hr tablet Take 1 tablet (50 mg total) by mouth daily. Take with or immediately following a meal. (Patient taking differently: Take 25 mg by mouth daily. Take with or immediately  following a meal.) 90 tablet 3   Multiple Vitamin (MULTIVITAMIN) capsule Take 1 capsule by mouth daily. Unknown strength     Omega-3 Fatty Acids (FISH OIL) 1000 MG CAPS Take 2 capsules by mouth daily.     Probiotic Product (PROBIOTIC DAILY PO) Take 1 tablet by mouth daily. Unknown strength     rosuvastatin (CRESTOR) 5 MG tablet TAKE 1 TABLET BY MOUTH THREE TIMES A WEEK (Patient taking differently: Take 5 mg by mouth 3 (three) times a week.) 36 tablet 3   Semaglutide,0.25 or 0.'5MG'$ /DOS, (OZEMPIC, 0.25 OR 0.5 MG/DOSE,) 2 MG/1.5ML SOPN Inject into the skin.     triamterene-hydrochlorothiazide (MAXZIDE-25) 37.5-25 MG tablet Take 1 tablet by mouth every other day. (Patient taking differently: Take 0.5 tablets by mouth daily.) 90 tablet 3   TURMERIC PO Take 1 tablet by mouth daily. Unknown strength     Zinc Sulfate (ZINC 15 PO) Take 15 mg by mouth daily.     No current facility-administered medications for this visit.    Family History  Problem Relation Age of Onset   Diabetes Mother    Uterine cancer Mother    Colon polyps Mother    Allergic rhinitis Mother    Hypertension Mother    Thyroid disease Mother    Obesity Mother    Pancreatic cancer Father    Hyperlipidemia Father    Hypertension Father    Crohn's disease Cousin    Diabetes Maternal Grandmother    Stroke Maternal Grandmother    Diabetes Maternal Grandfather    Stroke Paternal Grandmother    Colon cancer Neg Hx    Stomach cancer Neg Hx    Esophageal cancer Neg Hx    Rectal cancer Neg Hx    Liver cancer Neg Hx    Eczema Neg Hx    Urticaria Neg Hx    Asthma Neg Hx     Review of Systems  All other systems reviewed and are negative.   Exam:   BP 134/80   Pulse 98   Ht '5\' 2"'$  (1.575 m)   Wt 176 lb (79.8 kg)   LMP  (LMP Unknown)   SpO2 100%   BMI 32.19 kg/m   Weight change: '@WEIGHTCHANGE'$ @ Height:   Height: '5\' 2"'$  (157.5 cm)  Ht Readings from Last 3 Encounters:  10/28/21 '5\' 2"'$  (1.575 m)  10/22/21 '5\' 2"'$  (1.575 m)   09/30/21 '5\' 2"'$  (1.575 m)    General appearance: alert, cooperative and appears stated age Head: Normocephalic, without obvious abnormality, atraumatic Neck: no adenopathy, supple, symmetrical, trachea midline and  thyroid normal to inspection and palpation Lungs: clear to auscultation bilaterally Cardiovascular: regular rate and rhythm Breasts: normal appearance, no masses or tenderness, bilateral implants are soft Abdomen: soft, non-tender; non distended,  no masses,  no organomegaly Extremities: extremities normal, atraumatic, no cyanosis or edema Skin: Skin color, texture, turgor normal. No rashes or lesions Lymph nodes: Cervical, supraclavicular, and axillary nodes normal. No abnormal inguinal nodes palpated Neurologic: Grossly normal   Pelvic: External genitalia:  no lesions              Urethra:  normal appearing urethra with no masses, tenderness or lesions              Bartholins and Skenes: normal                 Vagina: normal appearing vagina with normal color and discharge, no lesions              Cervix: no lesions               Bimanual Exam:  Uterus:   no masses or tenderness              Adnexa: no mass, fullness, tenderness               Rectovaginal: Confirms               Anus:  normal sphincter tone, no lesions  Gae Dry, CMA chaperoned for the exam.  1. Well woman exam Discussed breast self exam Discussed calcium and vit D intake Mammogram, pap and colonoscopy are UTD Labs with primary

## 2021-10-22 NOTE — Patient Instructions (Addendum)

## 2021-10-27 ENCOUNTER — Ambulatory Visit (HOSPITAL_BASED_OUTPATIENT_CLINIC_OR_DEPARTMENT_OTHER)
Admission: RE | Admit: 2021-10-27 | Discharge: 2021-10-27 | Disposition: A | Discharge status: Home / Self Care | Payer: 59 | Source: Ambulatory Visit | Attending: Cardiology | Admitting: Cardiology

## 2021-10-27 DIAGNOSIS — R0609 Other forms of dyspnea: Secondary | ICD-10-CM | POA: Insufficient documentation

## 2021-10-27 LAB — ECHOCARDIOGRAM COMPLETE
AR max vel: 2.05 cm2
AV Area VTI: 1.93 cm2
AV Area mean vel: 1.87 cm2
AV Mean grad: 4 mmHg
AV Peak grad: 7.6 mmHg
Ao pk vel: 1.38 m/s
Area-P 1/2: 5.97 cm2
S' Lateral: 2.8 cm

## 2021-10-27 NOTE — Progress Notes (Signed)
  Echocardiogram 2D Echocardiogram has been performed.  Merrie Roof F 10/27/2021, 1:01 PM

## 2021-10-28 ENCOUNTER — Ambulatory Visit: Payer: 59 | Admitting: Obstetrics and Gynecology

## 2021-10-28 ENCOUNTER — Other Ambulatory Visit (HOSPITAL_COMMUNITY): Payer: Self-pay

## 2021-10-28 ENCOUNTER — Encounter: Payer: Self-pay | Admitting: Obstetrics and Gynecology

## 2021-10-28 VITALS — BP 134/80 | HR 98 | Ht 62.0 in | Wt 176.0 lb

## 2021-10-28 DIAGNOSIS — Z01419 Encounter for gynecological examination (general) (routine) without abnormal findings: Secondary | ICD-10-CM

## 2021-10-28 NOTE — Patient Instructions (Signed)

## 2021-10-29 ENCOUNTER — Other Ambulatory Visit (HOSPITAL_COMMUNITY): Payer: Self-pay

## 2021-10-29 MED ORDER — WEGOVY 1 MG/0.5ML ~~LOC~~ SOAJ
1.0000 mg | SUBCUTANEOUS | 0 refills | Status: DC
  Filled 2021-10-29 – 2021-11-02 (×2): qty 2, 28d supply, fill #0

## 2021-11-02 ENCOUNTER — Other Ambulatory Visit (HOSPITAL_COMMUNITY): Payer: Self-pay

## 2021-11-13 ENCOUNTER — Other Ambulatory Visit: Payer: 59

## 2021-11-16 ENCOUNTER — Other Ambulatory Visit: Payer: 59

## 2021-11-16 DIAGNOSIS — E039 Hypothyroidism, unspecified: Secondary | ICD-10-CM

## 2021-11-17 ENCOUNTER — Other Ambulatory Visit: Payer: 59

## 2021-11-17 DIAGNOSIS — E039 Hypothyroidism, unspecified: Secondary | ICD-10-CM

## 2021-11-17 LAB — TSH: TSH: 0.53 mIU/L (ref 0.40–4.50)

## 2021-11-19 ENCOUNTER — Telehealth (INDEPENDENT_AMBULATORY_CARE_PROVIDER_SITE_OTHER): Payer: 59 | Admitting: Internal Medicine

## 2021-11-19 ENCOUNTER — Encounter: Payer: Self-pay | Admitting: Internal Medicine

## 2021-11-19 ENCOUNTER — Telehealth: Payer: 59 | Admitting: Internal Medicine

## 2021-11-19 VITALS — Wt 172.0 lb

## 2021-11-19 DIAGNOSIS — E039 Hypothyroidism, unspecified: Secondary | ICD-10-CM

## 2021-11-19 DIAGNOSIS — Z7184 Encounter for health counseling related to travel: Secondary | ICD-10-CM | POA: Diagnosis not present

## 2021-11-19 MED ORDER — LEVOTHYROXINE SODIUM 137 MCG PO TABS: 137.0000 ug | ORAL_TABLET | Freq: Every day | ORAL | 1 refills | Status: DC

## 2021-11-19 MED ORDER — BENZONATATE 100 MG PO CAPS: 100.0000 mg | ORAL_CAPSULE | Freq: Two times a day (BID) | ORAL | 0 refills | Status: DC | PRN

## 2021-11-19 MED ORDER — DOXYCYCLINE HYCLATE 100 MG PO TABS: 100.0000 mg | ORAL_TABLET | Freq: Two times a day (BID) | ORAL | 0 refills | Status: DC

## 2021-11-19 MED ORDER — FLUCONAZOLE 150 MG PO TABS: 150.0000 mg | ORAL_TABLET | Freq: Every day | ORAL | 1 refills | Status: DC

## 2021-11-19 MED ORDER — METHYLPREDNISOLONE 4 MG PO TBPK: ORAL_TABLET | ORAL | 0 refills | Status: DC

## 2021-11-19 NOTE — Patient Instructions (Addendum)
Patient seen for follow-up on hypothyroidism.  Feels well on current dose of thyroid replacement medication and TSH is within normal limits.  Continue same dose of thyroid replacement medication levothyroxine 137 mcg daily.  Regarding travel in the near future have sent in doxycycline, Tessalon Perles, steroid Dosepak and Diflucan.  Patient declines further COVID vaccines.  Recommend flu vaccine.

## 2021-11-19 NOTE — Progress Notes (Signed)
   Subjective:    Patient ID: Rebecca Mccoy, female    DOB: November 24, 1965, 56 y.o.   MRN: [id number]  HPI  56 year old Female seen by interactive audio and video telecommunications.  Patient is at her home and I am at my office.  She is identified using 2 identifiers as Rebecca Mccoy. [place], a patient in this practice.  She is agreeable to visit in this format today  Patient seen regarding follow-up on hypothyroidism.  She was seen in September for health maintenance exam here.  At that time her TSH was 3.44 and we increase levothyroxine to 137 mcg daily.  She recently had repeat TSH on this dose of thyroid replacement medication.  Level had improved to 0.53.  She feels well on this dose.  Beginning to see improvement in weight loss efforts on this dose.  She has not had palpitations or heart issues on this increased dose of thyroid replacement.  Also, tells me that she is going on a mission trip to the [place]  Have suggested that she travel with an antibiotic, Diflucan and a steroid Dosepak should she develop a respiratory infection.  She is agreeable to this plan.  She is not willing to have a COVID-vaccine at this point in time due to her concerns about heart issues and COVID-vaccine.  I urged her to consider a flu vaccine.  Tetanus immunization is up-to-date.    Review of Systems see above-no new complaints     Objective:   Physical Exam  Seen virtually in no acute distress      Assessment & Plan:  Hypothyroidism-TSH is now 0.53.  She is beginning to see some improvement in weight loss efforts on increased dose of levothyroxine.  Not having any palpitations or other issues with increased dose.  We will continue with this dose for now.  Travel advice encounter-going on a mission trip to [place]  Have sent in for her Diflucan, steroid Dosepak and doxycycline 100 mg twice daily for 10 days.  Recommend flu vaccine.  For health maintenance exam is due September 2024.  She  saw GYN physician in October 2023.  She continues with weight loss efforts and was recently prescribed Wegovy by Dr. Briscoe Deutscher  Time spent reviewing records, interviewing patient, medical decision making and E scribing medications is 20 minutes

## 2021-11-26 ENCOUNTER — Other Ambulatory Visit (HOSPITAL_COMMUNITY): Payer: Self-pay

## 2021-11-26 MED ORDER — WEGOVY 1.7 MG/0.75ML ~~LOC~~ SOAJ
1.7000 mg | SUBCUTANEOUS | 0 refills | Status: DC
  Filled 2021-11-26: qty 3, 28d supply, fill #0

## 2021-12-26 ENCOUNTER — Other Ambulatory Visit: Payer: Self-pay | Admitting: Cardiology

## 2021-12-28 NOTE — Telephone Encounter (Signed)
Rx refill sent to pharmacy. 

## 2022-01-01 ENCOUNTER — Other Ambulatory Visit (HOSPITAL_COMMUNITY): Payer: Self-pay

## 2022-01-01 MED ORDER — WEGOVY 2.4 MG/0.75ML ~~LOC~~ SOAJ
2.4000 mg | SUBCUTANEOUS | 3 refills | Status: DC
  Filled 2022-01-01: qty 3, 28d supply, fill #0
  Filled 2022-06-02: qty 3, 28d supply, fill #1
  Filled 2022-06-25: qty 3, 28d supply, fill #2
  Filled 2022-07-15: qty 3, 28d supply, fill #3

## 2022-01-11 ENCOUNTER — Other Ambulatory Visit: Payer: Self-pay | Admitting: Internal Medicine

## 2022-01-28 ENCOUNTER — Other Ambulatory Visit (HOSPITAL_COMMUNITY): Payer: Self-pay

## 2022-01-28 MED ORDER — WEGOVY 2.4 MG/0.75ML ~~LOC~~ SOAJ
2.4000 mg | SUBCUTANEOUS | 3 refills | Status: DC
  Filled 2022-01-28: qty 3, 28d supply, fill #0
  Filled 2022-02-25: qty 3, 28d supply, fill #1

## 2022-02-02 ENCOUNTER — Other Ambulatory Visit (HOSPITAL_COMMUNITY): Payer: Self-pay

## 2022-02-23 LAB — BASIC METABOLIC PANEL
BUN: 13 (ref 4–21)
CO2: 32 — AB (ref 13–22)
Chloride: 100 (ref 99–108)
Creatinine: 0.9 (ref 0.5–1.1)
Glucose: 84
Potassium: 4.4 mEq/L (ref 3.5–5.1)
Sodium: 140 (ref 137–147)

## 2022-02-23 LAB — IRON,TIBC AND FERRITIN PANEL
%SAT: 34
Ferritin: 132.7
Iron: 139
TIBC: 411

## 2022-02-23 LAB — HEPATIC FUNCTION PANEL
ALT: 16 U/L (ref 7–35)
AST: 21 (ref 13–35)
Alkaline Phosphatase: 60 (ref 25–125)

## 2022-02-23 LAB — HEMOGLOBIN A1C: Hemoglobin A1C: 5.4

## 2022-02-23 LAB — COMPREHENSIVE METABOLIC PANEL
Albumin: 4.9 (ref 3.5–5.0)
Calcium: 10.6 (ref 8.7–10.7)
eGFR: 76

## 2022-02-23 LAB — TSH: TSH: 0.06 — AB (ref 0.41–5.90)

## 2022-02-23 LAB — CBC AND DIFFERENTIAL
HCT: 41 (ref 36–46)
Hemoglobin: 13.8 (ref 12.0–16.0)
Platelets: 255 10*3/uL (ref 150–400)
WBC: 5

## 2022-02-23 LAB — CBC: RBC: 4.34 (ref 3.87–5.11)

## 2022-03-03 ENCOUNTER — Telehealth: Payer: Self-pay

## 2022-03-03 ENCOUNTER — Encounter: Payer: Self-pay | Admitting: Internal Medicine

## 2022-03-03 NOTE — Telephone Encounter (Addendum)
Patient called asking if lab results done on 02/23/22 at [facility] have been received, I informed the patient that they have.  Patient would like Dr. Renold Genta to review TSH results and make the appropriate adjustments to her Synthroid Rx if need be. Does patient need an appt to discuss results?

## 2022-03-04 ENCOUNTER — Telehealth: Payer: 59 | Admitting: Internal Medicine

## 2022-03-04 VITALS — Temp 97.9°F | Wt 155.8 lb

## 2022-03-04 DIAGNOSIS — R7989 Other specified abnormal findings of blood chemistry: Secondary | ICD-10-CM

## 2022-03-04 DIAGNOSIS — E039 Hypothyroidism, unspecified: Secondary | ICD-10-CM | POA: Diagnosis not present

## 2022-03-04 NOTE — Telephone Encounter (Signed)
LVM to CB and set up video visit to discuss TSH levels

## 2022-03-04 NOTE — Telephone Encounter (Signed)
scheduled

## 2022-03-04 NOTE — Progress Notes (Signed)
Patient Care Team: Elby Showers, MD as PCP - General (Internal Medicine) Salvadore Dom, MD as Consulting Physician (Obstetrics and Gynecology)  I connected with Rebecca Mccoy on 03/04/22 at 5:02 PM by video enabled telemedicine visit and verified that I am speaking with the correct person using two identifiers.   I discussed the limitations, risks, security and privacy concerns of performing an evaluation and management service by telemedicine and the availability of in-person appointments. I also discussed with the patient that there may be a patient responsible charge related to this service. The patient expressed understanding and agreed to proceed.   Other persons participating in the visit and their role in the encounter: Medical scribe, Daiva Huge  Patient's location: Home  Provider's location: Clinic   I provided 15 minutes of face-to-face video visit time during this encounter, and > 50% was spent counseling as documented under my assessment & plan.   Chief Complaint: Low TSH.   Subjective:    Patient ID: Rebecca Mccoy , Female    DOB: [birth year], 57 y.o.    MRN: [id number]   57 y.o. Female presents today to discuss low TSH. Patient has a past medical history of hypothyroidism, hyperlipidemia, hypertension, irregular heart beat, anxiety and depression, GERD, urticaria.  Reports experiencing recent hair loss and fatigue, believed to be associated with low TSH. TSH  at 0.06 on 02/23/22. Seen at [facility] for weight-loss program. Lost 21 pounds between 10/28/21 and 03/04/22.   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 08/14/2019   Dyslipidemia 07/03/2018   Fluid retention    GERD (gastroesophageal reflux disease)    GERD (gastroesophageal reflux disease)    Heart murmur    infant   High cholesterol    History of  migraine headaches 02/17/2014   Hx of adenomatous polyp of colon 07/09/2016   Sessile serrated polyp 11/17   Hypertension    Hypothyroidism 12/03/2015   Insomnia 07/30/2016   Irregular heart beat    Migraines    Non-allergic rhinitis 06/10/2017   OSA (obstructive sleep apnea) 10/04/2017   PID (pelvic inflammatory disease)    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   Recurrent upper respiratory infection (URI)    April 2019   Sleep apnea    Supraventricular tachycardia 08/22/2018   Thyroid disease    Urticaria      Family History  Problem Relation Age of Onset   Diabetes Mother    Uterine cancer Mother    Colon polyps Mother    Allergic rhinitis Mother    Hypertension Mother    Thyroid disease Mother    Obesity Mother    Pancreatic cancer Father    Hyperlipidemia Father    Hypertension Father    Crohn's disease Cousin    Diabetes Maternal Grandmother    Stroke Maternal Grandmother    Diabetes Maternal Grandfather    Stroke Paternal Grandmother    Colon cancer Neg Hx    Stomach cancer Neg Hx    Esophageal cancer Neg Hx    Rectal cancer Neg Hx    Liver cancer Neg Hx    Eczema Neg Hx    Urticaria Neg Hx    Asthma Neg Hx          Review of Systems  Constitutional:  Positive for  malaise/fatigue. Negative for fever.  HENT:  Negative for congestion.        (+) Hair loss  Eyes:  Negative for blurred vision.  Respiratory:  Negative for cough and shortness of breath.   Cardiovascular:  Negative for chest pain, palpitations and leg swelling.  Gastrointestinal:  Negative for vomiting.  Musculoskeletal:  Negative for back pain.  Skin:  Negative for rash.  Neurological:  Negative for loss of consciousness and headaches.        Objective:   Vitals: Temp 97.9 F (36.6 C)   Wt 155 lb 12.8 oz (70.7 kg)   LMP  (LMP Unknown)   BMI 28.50 kg/m    Physical Exam Constitutional:      General: She is not in acute distress.    Appearance: Normal  appearance. She is not ill-appearing.  HENT:     Head: Normocephalic and atraumatic.  Pulmonary:     Effort: Pulmonary effort is normal.  Neurological:     Mental Status: She is alert and oriented to person, place, and time. Mental status is at baseline.  Psychiatric:        Mood and Affect: Mood normal.        Behavior: Behavior normal.        Thought Content: Thought content normal.        Judgment: Judgment normal.       Results:   Studies obtained and personally reviewed by me:   Labs:       Component Value Date/Time   NA 140 02/23/2022 0000   K 4.4 02/23/2022 0000   CL 100 02/23/2022 0000   CO2 32 (A) 02/23/2022 0000   GLUCOSE 89 09/25/2021 1147   BUN 13 02/23/2022 0000   CREATININE 0.9 02/23/2022 0000   CREATININE 1.01 09/25/2021 1147   CALCIUM 10.6 02/23/2022 0000   PROT 7.0 09/25/2021 1147   PROT 7.5 07/01/2020 1741   ALBUMIN 4.9 02/23/2022 0000   ALBUMIN 4.9 07/01/2020 1741   AST 21 02/23/2022 0000   ALT 16 02/23/2022 0000   ALKPHOS 60 02/23/2022 0000   BILITOT 0.8 09/25/2021 1147   BILITOT 1.0 07/01/2020 1741   GFRNONAA 64 06/22/2019 0915   GFRAA 74 06/22/2019 0915     Lab Results  Component Value Date   WBC 5.0 02/23/2022   HGB 13.8 02/23/2022   HCT 41 02/23/2022   MCV 95.1 09/25/2021   PLT 255 02/23/2022    Lab Results  Component Value Date   CHOL 193 09/25/2021   HDL 71 09/25/2021   LDLCALC 94 09/25/2021   TRIG 182 (H) 09/25/2021   CHOLHDL 2.7 09/25/2021    Lab Results  Component Value Date   HGBA1C 5.4 02/23/2022     Lab Results  Component Value Date   TSH 0.06 (A) 02/23/2022      Assessment & Plan:   Hypothyroidism: TSH low at 0.06 on 02/23/22. Levothyroxine reduced from 137 mcg to 125 mcg daily before breakfast.  Return in 6 weeks for TSH re-check and office visit.New Rx sent in for Levothyroxine 125 mcg. Take on empty stomach daily.    I,Alexander Ruley,acting as a Education administrator for Elby Showers, MD.,have documented all  relevant documentation on the behalf of Elby Showers, MD,as directed by  Elby Showers, MD while in the presence of Elby Showers, MD.   I, Elby Showers, MD, have reviewed all documentation for this visit. The documentation on 03/07/22 for the exam, diagnosis, procedures, and orders  are all accurate and complete.

## 2022-03-05 ENCOUNTER — Telehealth: Payer: Self-pay | Admitting: Internal Medicine

## 2022-03-05 NOTE — Telephone Encounter (Signed)
Rebecca Mccoy called to say pharmacy has not received her new lower dose of medication yet.

## 2022-03-06 MED ORDER — LEVOTHYROXINE SODIUM 125 MCG PO TABS: 125.0000 ug | ORAL_TABLET | Freq: Every day | ORAL | 0 refills | Status: DC

## 2022-03-07 ENCOUNTER — Encounter: Payer: Self-pay | Admitting: Internal Medicine

## 2022-03-07 NOTE — Patient Instructions (Signed)
Start new dose of thyroid replacement med, levothyroxine 0.125 mg daily and follow up in 6 weeks.

## 2022-03-30 ENCOUNTER — Other Ambulatory Visit (HOSPITAL_COMMUNITY): Payer: Self-pay

## 2022-03-30 MED ORDER — WEGOVY 2.4 MG/0.75ML ~~LOC~~ SOAJ
2.4000 mg | SUBCUTANEOUS | 3 refills | Status: DC
  Filled 2022-03-30: qty 3, 28d supply, fill #0
  Filled 2022-05-10: qty 3, 28d supply, fill #1

## 2022-04-13 ENCOUNTER — Ambulatory Visit: Payer: 59 | Admitting: Internal Medicine

## 2022-04-15 NOTE — Progress Notes (Shared)
Patient Care Team: Elby Showers, MD as PCP - General (Internal Medicine) Salvadore Dom, MD as Consulting Physician (Obstetrics and Gynecology)  Visit Date: 04/15/22  Subjective:    Patient ID: Rebecca Mccoy , Female   DOB: 1965-01-28, 57 y.o.    MRN: [id number]   57 y.o. Female presents today for a 6 week TSA check. Patient has a past medical history of hypothyroidism, hyperlipidemia, hypertension, irregular heart beat, anxiety and depression, GERD, urticaria.  Seen here on 03/07/22 for hair loss and fatigue thought to be associated with low TSH. TSH at . Goes to [facility] for weight-loss program. 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 08/14/2019   Dyslipidemia 07/03/2018   Fluid retention    GERD (gastroesophageal reflux disease)    GERD (gastroesophageal reflux disease)    Heart murmur    infant   High cholesterol    History of migraine headaches 02/17/2014   Hx of adenomatous polyp of colon 07/09/2016   Sessile serrated polyp 11/17   Hypertension    Hypothyroidism 12/03/2015   Insomnia 07/30/2016   Irregular heart beat    Migraines    Non-allergic rhinitis 06/10/2017   OSA (obstructive sleep apnea) 10/04/2017   PID (pelvic inflammatory disease)    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   Recurrent upper respiratory infection (URI)    April 2019   Sleep apnea    Supraventricular tachycardia 08/22/2018   Thyroid disease    Urticaria      Family History  Problem Relation Age of Onset   Diabetes Mother    Uterine cancer Mother    Colon polyps Mother    Allergic rhinitis Mother    Hypertension Mother    Thyroid disease Mother    Obesity Mother    Pancreatic cancer Father    Hyperlipidemia Father    Hypertension Father    Crohn's disease Cousin    Diabetes Maternal Grandmother     Stroke Maternal Grandmother    Diabetes Maternal Grandfather    Stroke Paternal Grandmother    Colon cancer Neg Hx    Stomach cancer Neg Hx    Esophageal cancer Neg Hx    Rectal cancer Neg Hx    Liver cancer Neg Hx    Eczema Neg Hx    Urticaria Neg Hx    Asthma Neg Hx     Social History   Social History Narrative   Not on file      ROS      Objective:   Vitals: LMP  (LMP Unknown)    Physical Exam    Results:   Studies obtained and personally reviewed by me:  Imaging, colonoscopy, mammogram, bone density scan, echocardiogram, heart cath, stress test, CT calcium score, etc. ***   Labs:       Component Value Date/Time   NA 140 02/23/2022 0000   K 4.4 02/23/2022 0000   CL 100 02/23/2022 0000   CO2 32 (A) 02/23/2022 0000   GLUCOSE 89 09/25/2021 1147   BUN 13 02/23/2022 0000   CREATININE 0.9 02/23/2022 0000   CREATININE 1.01 09/25/2021 1147   CALCIUM 10.6 02/23/2022 0000   PROT 7.0 09/25/2021 1147   PROT 7.5 07/01/2020 1741   ALBUMIN 4.9 02/23/2022 0000   ALBUMIN 4.9 07/01/2020  1741   AST 21 02/23/2022 0000   ALT 16 02/23/2022 0000   ALKPHOS 60 02/23/2022 0000   BILITOT 0.8 09/25/2021 1147   BILITOT 1.0 07/01/2020 1741   GFRNONAA 64 06/22/2019 0915   GFRAA 74 06/22/2019 0915     Lab Results  Component Value Date   WBC 5.0 02/23/2022   HGB 13.8 02/23/2022   HCT 41 02/23/2022   MCV 95.1 09/25/2021   PLT 255 02/23/2022    Lab Results  Component Value Date   CHOL 193 09/25/2021   HDL 71 09/25/2021   LDLCALC 94 09/25/2021   TRIG 182 (H) 09/25/2021   CHOLHDL 2.7 09/25/2021    Lab Results  Component Value Date   HGBA1C 5.4 02/23/2022     Lab Results  Component Value Date   TSH 0.06 (A) 02/23/2022     No results found for: "PSA1", "PSA" *** delete for female pts  ***    Assessment & Plan:   ***    I,Alexander Ruley,acting as a scribe for Elby Showers, MD.,have documented all relevant documentation on the behalf of Elby Showers, MD,as directed by  Elby Showers, MD while in the presence of Elby Showers, MD.   ***

## 2022-04-20 ENCOUNTER — Other Ambulatory Visit: Payer: 59

## 2022-04-20 DIAGNOSIS — E039 Hypothyroidism, unspecified: Secondary | ICD-10-CM

## 2022-04-20 LAB — TSH: TSH: 0.19 mIU/L — ABNORMAL LOW (ref 0.40–4.50)

## 2022-04-22 ENCOUNTER — Other Ambulatory Visit: Payer: Self-pay | Admitting: Cardiology

## 2022-04-22 ENCOUNTER — Ambulatory Visit: Payer: 59 | Admitting: Internal Medicine

## 2022-04-22 ENCOUNTER — Other Ambulatory Visit: Payer: Self-pay

## 2022-04-22 MED ORDER — LEVOTHYROXINE SODIUM 112 MCG PO TABS: 112.0000 ug | ORAL_TABLET | Freq: Every day | ORAL | 0 refills | Status: DC

## 2022-05-03 ENCOUNTER — Encounter: Payer: Self-pay | Admitting: *Deleted

## 2022-05-27 DIAGNOSIS — R400 Somnolence: Secondary | ICD-10-CM | POA: Insufficient documentation

## 2022-05-27 DIAGNOSIS — R632 Polyphagia: Secondary | ICD-10-CM

## 2022-05-27 DIAGNOSIS — Z8639 Personal history of other endocrine, nutritional and metabolic disease: Secondary | ICD-10-CM

## 2022-05-27 HISTORY — DX: Personal history of other endocrine, nutritional and metabolic disease: Z86.39

## 2022-05-27 HISTORY — DX: Somnolence: R40.0

## 2022-05-27 HISTORY — DX: Polyphagia: R63.2

## 2022-05-27 NOTE — Progress Notes (Signed)
Patient Care Team: Margaree Mackintosh, MD as PCP - General (Internal Medicine) Romualdo Bolk, MD as Consulting Physician (Obstetrics and Gynecology)  Visit Date: 06/03/22  Subjective:    Patient ID: Rebecca Mccoy , Female   DOB: Jun 06, 1965, 57 y.o.    MRN: [id number]   57 y.o. Female presents today for a 6 week TSH recheck. History of hypothyroidism treated with levothyroxine 112 mcg daily before breakfast. TSH at 0.21 on 06/01/22, up from 0.19 on 04/20/22. She is concerned whether she has an autoimmune disease. She has been experiencing fatigue in the morning, hair loss, cold feeling. She is seen at [facility] for weight loss and reports she is at her goal weight.  Plans to be seen by dermatologist for irregular growths on her neck area.  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 08/14/2019   Dyslipidemia 07/03/2018   Fluid retention    GERD (gastroesophageal reflux disease)    GERD (gastroesophageal reflux disease)    Heart murmur    infant   High cholesterol    History of migraine headaches 02/17/2014   Hx of adenomatous polyp of colon 07/09/2016   Sessile serrated polyp 11/17   Hypertension    Hypothyroidism 12/03/2015   Insomnia 07/30/2016   Irregular heart beat    Migraines    Non-allergic rhinitis 06/10/2017   OSA (obstructive sleep apnea) 10/04/2017   PID (pelvic inflammatory disease)    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   Recurrent upper respiratory infection (URI)    April 2019   Sleep apnea    Supraventricular tachycardia 08/22/2018   Thyroid disease    Urticaria      Family History  Problem Relation Age of Onset   Diabetes Mother    Uterine cancer Mother    Colon polyps Mother    Allergic rhinitis Mother    Hypertension Mother    Thyroid disease Mother    Obesity Mother    Pancreatic  cancer Father    Hyperlipidemia Father    Hypertension Father    Crohn's disease Cousin    Diabetes Maternal Grandmother    Stroke Maternal Grandmother    Diabetes Maternal Grandfather    Stroke Paternal Grandmother    Colon cancer Neg Hx    Stomach cancer Neg Hx    Esophageal cancer Neg Hx    Rectal cancer Neg Hx    Liver cancer Neg Hx    Eczema Neg Hx    Urticaria Neg Hx    Asthma Neg Hx     Social History   Social History Narrative   Not on file      Review of Systems  Constitutional:  Positive for malaise/fatigue. Negative for fever.  HENT:  Negative for congestion.   Eyes:  Negative for blurred vision.  Respiratory:  Negative for cough and shortness of breath.   Cardiovascular:  Negative for chest pain, palpitations and leg swelling.  Gastrointestinal:  Negative for vomiting.  Musculoskeletal:  Negative for back pain.  Skin:  Negative for rash.  Neurological:  Negative for loss of consciousness and headaches.        Objective:   Vitals: BP 110/80   Pulse 83   Temp 98 F (36.7 C) (Tympanic)   Ht 5\' 2"  (1.575 m)   Wt 154 lb  12.8 oz (70.2 kg)   LMP  (LMP Unknown)   SpO2 99%   BMI 28.31 kg/m    Physical Exam Vitals and nursing note reviewed.  Constitutional:      General: She is not in acute distress.    Appearance: Normal appearance. She is not toxic-appearing.  HENT:     Head: Normocephalic and atraumatic.  Neck:     Thyroid: No thyroid mass, thyromegaly or thyroid tenderness.     Vascular: No carotid bruit.  Pulmonary:     Effort: Pulmonary effort is normal.  Lymphadenopathy:     Cervical: No cervical adenopathy.  Skin:    General: Skin is warm and dry.  Neurological:     Mental Status: She is alert and oriented to person, place, and time. Mental status is at baseline.  Psychiatric:        Mood and Affect: Mood normal.        Behavior: Behavior normal.        Thought Content: Thought content normal.        Judgment: Judgment normal.        Results:   Studies obtained and personally reviewed by me:    Labs:       Component Value Date/Time   NA 140 02/23/2022 0000   K 4.4 02/23/2022 0000   CL 100 02/23/2022 0000   CO2 32 (A) 02/23/2022 0000   GLUCOSE 89 09/25/2021 1147   BUN 13 02/23/2022 0000   CREATININE 0.9 02/23/2022 0000   CREATININE 1.01 09/25/2021 1147   CALCIUM 10.6 02/23/2022 0000   PROT 7.0 09/25/2021 1147   PROT 7.5 07/01/2020 1741   ALBUMIN 4.9 02/23/2022 0000   ALBUMIN 4.9 07/01/2020 1741   AST 21 02/23/2022 0000   ALT 16 02/23/2022 0000   ALKPHOS 60 02/23/2022 0000   BILITOT 0.8 09/25/2021 1147   BILITOT 1.0 07/01/2020 1741   GFRNONAA 64 06/22/2019 0915   GFRAA 74 06/22/2019 0915     Lab Results  Component Value Date   WBC 5.0 02/23/2022   HGB 13.8 02/23/2022   HCT 41 02/23/2022   MCV 95.1 09/25/2021   PLT 255 02/23/2022    Lab Results  Component Value Date   CHOL 193 09/25/2021   HDL 71 09/25/2021   LDLCALC 94 09/25/2021   TRIG 182 (H) 09/25/2021   CHOLHDL 2.7 09/25/2021    Lab Results  Component Value Date   HGBA1C 5.4 02/23/2022     Lab Results  Component Value Date   TSH 0.21 (L) 06/01/2022      Assessment & Plan:   Hypothyroidism: previously treated with levothyroxine 112 mcg daily before breakfast. Decrease levothyroxine to 88 mcg daily before breakfast. TSH at 0.21 on 06/01/22, up from 0.19 on 04/20/22.  Will consider endocrinology referral if issues persist.  Return in 6 weeks for TSH, T4.    I,Alexander Ruley,acting as a scribe for Margaree Mackintosh, MD.,have documented all relevant documentation on the behalf of Margaree Mackintosh, MD,as directed by  Margaree Mackintosh, MD while in the presence of Margaree Mackintosh, MD.   I, Margaree Mackintosh, MD, have reviewed all documentation for this visit. The documentation on 06/05/22 for the exam, diagnosis, procedures, and orders are all accurate and complete.

## 2022-05-31 ENCOUNTER — Ambulatory Visit: Payer: 59 | Attending: Cardiology | Admitting: Cardiology

## 2022-05-31 ENCOUNTER — Telehealth: Payer: Self-pay | Admitting: Cardiology

## 2022-05-31 ENCOUNTER — Encounter: Payer: Self-pay | Admitting: Cardiology

## 2022-05-31 VITALS — BP 100/70 | HR 83 | Ht 62.0 in | Wt 155.0 lb

## 2022-05-31 DIAGNOSIS — I10 Essential (primary) hypertension: Secondary | ICD-10-CM

## 2022-05-31 DIAGNOSIS — I420 Dilated cardiomyopathy: Secondary | ICD-10-CM

## 2022-05-31 DIAGNOSIS — G4733 Obstructive sleep apnea (adult) (pediatric): Secondary | ICD-10-CM

## 2022-05-31 DIAGNOSIS — I471 Supraventricular tachycardia, unspecified: Secondary | ICD-10-CM | POA: Diagnosis not present

## 2022-05-31 DIAGNOSIS — E785 Hyperlipidemia, unspecified: Secondary | ICD-10-CM

## 2022-05-31 NOTE — Progress Notes (Signed)
Cardiology Office Note:    Date:  05/31/2022   ID:  Rebecca Mccoy, DOB 09-24-1965, MRN [id number]  PCP:  Margaree Mackintosh, MD  Cardiologist:  Gypsy Balsam, MD    Referring MD: Margaree Mackintosh, MD   Chief Complaint  Patient presents with   Low BP   Fatigue    Lowered Losartan to once daily    History of Present Illness:    Rebecca Mccoy is a 57 y.o. female with past medical history significant for nonischemic cardiomyopathy initial ejection fraction 45%, obstructive coronary artery was disease was ruled out by coronary CT angio which showed only 0 to 25% stenosis, additional problem include supraventricular tachycardia, obstructive sleep apnea, obesity. Comes today 2 months of follow-up to issues blood pressure being low she also described the fact when she wakes up in the morning she is tired and exhausted for about hour to after that she can form up and take so much better.  She tried already cut down her afternoon dose of losartan but that she did not notice any significant difference that being only for few days when she take half dose of losartan.  Denies have any chest pain tightness squeezing pressure burning chest.  Still very active cutting grass in her properties and doing quite well  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 08/14/2019   Dyslipidemia 07/03/2018   Fluid retention    GERD (gastroesophageal reflux disease)    GERD (gastroesophageal reflux disease)    Heart murmur    infant   High cholesterol    History of migraine headaches 02/17/2014   Hx of adenomatous polyp of colon 07/09/2016   Sessile serrated polyp 11/17   Hypertension    Hypothyroidism 12/03/2015   Insomnia 07/30/2016   Irregular heart beat    Migraines    Non-allergic rhinitis 06/10/2017   OSA (obstructive sleep apnea) 10/04/2017   PID (pelvic inflammatory  disease)    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   Recurrent upper respiratory infection (URI)    April 2019   Sleep apnea    Supraventricular tachycardia 08/22/2018   Thyroid disease    Urticaria     Past Surgical History:  Procedure Laterality Date   AUGMENTATION MAMMAPLASTY Bilateral 2005   BREAST SURGERY  2005   Augmentation   CARPAL TUNNEL RELEASE Bilateral    R in '07 and L '06   COLONOSCOPY  2017   COSMETIC SURGERY     leg lift     TONSILLECTOMY  1972   TUBAL LIGATION  1989   tummy tuck  2005    Current Medications: Current Meds  Medication Sig   albuterol (VENTOLIN HFA) 108 (90 Base) MCG/ACT inhaler Inhale 1-2 puffs into the lungs every 6 (six) hours as needed for wheezing or shortness of breath.   ALPRAZolam (XANAX) 1 MG tablet Take 1 tablet by mouth twice daily as needed for anxiety (Patient taking differently: Take 1 mg by mouth 2 (two) times daily as needed for anxiety or sleep. for anxiety)   Ascorbic Acid (VITAMIN C PO) Take 1 tablet by mouth daily. Unknown strength   aspirin EC 81 MG tablet Take 1 tablet (81 mg total) by mouth daily. Swallow whole.   B Complex Vitamins (B COMPLEX 100 PO) Take 1 capsule by mouth daily.  Unknown strength   budesonide-formoterol (SYMBICORT) 160-4.5 MCG/ACT inhaler Inhale 2 puffs into the lungs 2 (two) times daily. (Patient taking differently: Inhale 2 puffs into the lungs as needed.)   glucosamine-chondroitin 500-400 MG tablet Take 1 tablet by mouth daily.   levothyroxine (SYNTHROID) 112 MCG tablet Take 1 tablet (112 mcg total) by mouth daily before breakfast.   losartan (COZAAR) 25 MG tablet Take 1 tablet (25 mg total) by mouth 2 (two) times daily. (Patient taking differently: Take 25 mg by mouth daily.)   metoprolol succinate (TOPROL-XL) 50 MG 24 hr tablet Take 0.5 tablets (25 mg total) by mouth daily. Take with or immediately following a meal.   Multiple Vitamin (MULTIVITAMIN) capsule Take 1 capsule by mouth  daily. Unknown strength   Omega-3 Fatty Acids (FISH OIL) 1000 MG CAPS Take 2 capsules by mouth daily.   Probiotic Product (PROBIOTIC DAILY PO) Take 1 tablet by mouth daily. Unknown strength   rosuvastatin (CRESTOR) 5 MG tablet TAKE 1 TABLET BY MOUTH THREE TIMES A WEEK (Patient taking differently: Take 5 mg by mouth 3 (three) times a week.)   Semaglutide-Weight Management (WEGOVY) 2.4 MG/0.75ML SOAJ Inject 2.4 mg into the skin once a week.   triamterene-hydrochlorothiazide (MAXZIDE-25) 37.5-25 MG tablet Take 1 tablet by mouth every other day. (Patient taking differently: Take 0.5 tablets by mouth every other day.)   TURMERIC PO Take 1 tablet by mouth daily. Unknown strength   Zinc Sulfate (ZINC 15 PO) Take 15 mg by mouth daily.   [DISCONTINUED] Semaglutide-Weight Management (WEGOVY) 2.4 MG/0.75ML SOAJ Inject 2.4 mg into the skin once a week.   [DISCONTINUED] Semaglutide-Weight Management (WEGOVY) 2.4 MG/0.75ML SOAJ Inject 2.4 mg into the skin once a week.     Allergies:   Patient has no known allergies.   Social History   Socioeconomic History   Marital status: Married    Spouse name: Not on file   Number of children: 2   Years of education: Not on file   Highest education level: Not on file  Occupational History   Occupation: Operations Support Specialist  Tobacco Use   Smoking status: Never   Smokeless tobacco: Never  Vaping Use   Vaping Use: Never used  Substance and Sexual Activity   Alcohol use: Yes    Alcohol/week: 0.0 - 2.0 standard drinks of alcohol    Comment: 1-2 beer weekly   Drug use: No   Sexual activity: Yes    Partners: Male    Birth control/protection: Surgical, Post-menopausal    Comment: INTERCOUSE AGE 37, SEXUAL PARTNERS MORE THAN 5  Other Topics Concern   Not on file  Social History Narrative   Not on file   Social Determinants of Health   Financial Resource Strain: Not on file  Food Insecurity: Not on file  Transportation Needs: Not on file   Physical Activity: Not on file  Stress: Not on file  Social Connections: Not on file     Family History: The patient's family history includes Allergic rhinitis in her mother; Colon polyps in her mother; Crohn's disease in her cousin; Diabetes in her maternal grandfather, maternal grandmother, and mother; Hyperlipidemia in her father; Hypertension in her father and mother; Obesity in her mother; Pancreatic cancer in her father; Stroke in her maternal grandmother and paternal grandmother; Thyroid disease in her mother; Uterine cancer in her mother. There is no history of Colon cancer, Stomach cancer, Esophageal cancer, Rectal cancer, Liver cancer, Eczema, Urticaria, or Asthma. ROS:   Please see the  history of present illness.    All 14 point review of systems negative except as described per history of present illness  EKGs/Labs/Other Studies Reviewed:      Recent Labs: 02/23/2022: ALT 16; BUN 13; Creatinine 0.9; Hemoglobin 13.8; Platelets 255; Potassium 4.4; Sodium 140 04/20/2022: TSH 0.19  Recent Lipid Panel    Component Value Date/Time   CHOL 193 09/25/2021 1147   TRIG 182 (H) 09/25/2021 1147   HDL 71 09/25/2021 1147   CHOLHDL 2.7 09/25/2021 1147   VLDL 22 07/26/2016 0914   LDLCALC 94 09/25/2021 1147    Physical Exam:    VS:  BP 100/70 (BP Location: Left Arm, Patient Position: Sitting)   Pulse 83   Ht 5\' 2"  (1.575 m)   Wt 155 lb (70.3 kg)   LMP  (LMP Unknown)   SpO2 99%   BMI 28.35 kg/m     Wt Readings from Last 3 Encounters:  05/31/22 155 lb (70.3 kg)  03/04/22 155 lb 12.8 oz (70.7 kg)  11/19/21 172 lb (78 kg)     GEN:  Well nourished, well developed in no acute distress HEENT: Normal NECK: No JVD; No carotid bruits LYMPHATICS: No lymphadenopathy CARDIAC: RRR, no murmurs, no rubs, no gallops RESPIRATORY:  Clear to auscultation without rales, wheezing or rhonchi  ABDOMEN: Soft, non-tender, non-distended MUSCULOSKELETAL:  No edema; No deformity  SKIN: Warm and  dry LOWER EXTREMITIES: no swelling NEUROLOGIC:  Alert and oriented x 3 PSYCHIATRIC:  Normal affect   ASSESSMENT:    1. Dilated cardiomyopathy (HCC)   2. Primary hypertension   3. Supraventricular tachycardia   4. OSA (obstructive sleep apnea)   5. Dyslipidemia    PLAN:    In order of problems listed above:  Dilated cardiomyopathy last echocardiogram showed normal left ventricle ejection fraction she is on losartan as well as Toprol-XL.  However her blood pressure being low so losartan dose has been reduced to only 25 once daily.  She is also taking diuretics.  Previously tried to discontinue diuretic which led to increase her weight by 3 to 4 pounds.  I propose to stop diuretics completely except the fact that she is going again few pounds.  As long as does not keep going up then we should fine.  If those maneuvers will not make her feel better we may be forced to stop this temporarily to see if that will make any improvement her Toprol-XL.  I will schedule her to have echocardiogram in about 3 to 4 months make sure that those maneuvers will not lead to reactivation of the probably cardiomyopathy. Essential hypertension with dealing with opposite problem blood pressure being low. Supraventricular tachycardia denies having any. Obstructive sleep apnea stable use CPAP mask 5 to 8 hours every single night. Dyslipidemia she is taking Crestor 5 which I will continue.  I did review K PN which show LDL of 94 HDL 71 this is from September 25, 2021 will continue will recheck later she is losing some weight intentionally with using Wegovy for this situation to be reassessed within the next 2 months   Medication Adjustments/Labs and Tests Ordered: Current medicines are reviewed at length with the patient today.  Concerns regarding medicines are outlined above.  No orders of the defined types were placed in this encounter.  Medication changes: No orders of the defined types were placed in this  encounter.   Signed, Georgeanna Lea, MD, [facility] 05/31/2022 9:19 AM    [facility]

## 2022-05-31 NOTE — Addendum Note (Signed)
Addended by: Roxanne Mins I on: 05/31/2022 09:50 AM   Modules accepted: Orders

## 2022-05-31 NOTE — Patient Instructions (Addendum)
Medication Instructions:  Your physician has recommended you make the following change in your medication:   STOP: Triamterene - Hydrochlorothiazide  *If you need a refill on your cardiac medications before your next appointment, please call your pharmacy*   Lab Work: None If you have labs (blood work) drawn today and your tests are completely normal, you will receive your results only by: MyChart Message (if you have MyChart) OR A paper copy in the mail If you have any lab test that is abnormal or we need to change your treatment, we will call you to review the results.   Testing/Procedures: Your physician has requested that you have an echocardiogram. Echocardiography is a painless test that uses sound waves to create images of your heart. It provides your doctor with information about the size and shape of your heart and how well your heart's chambers and valves are working. This procedure takes approximately one hour. There are no restrictions for this procedure. Please do NOT wear cologne, perfume, aftershave, or lotions (deodorant is allowed). Please arrive 15 minutes prior to your appointment time.    Follow-Up: At [facility], you and your health needs are our priority.  As part of our continuing mission to provide you with exceptional heart care, we have created designated Provider Care Teams.  These Care Teams include your primary Cardiologist (physician) and Advanced Practice Providers (APPs -  Physician Assistants and Nurse Practitioners) who all work together to provide you with the care you need, when you need it.  We recommend signing up for the patient portal called "MyChart".  Sign up information is provided on this After Visit Summary.  MyChart is used to connect with patients for Virtual Visits (Telemedicine).  Patients are able to view lab/test results, encounter notes, upcoming appointments, etc.  Non-urgent messages can be sent to your provider as well.   To  learn more about what you can do with MyChart, go to [email or web address].    Your next appointment:   6 month(s)  Provider:   Gypsy Balsam, MD    Other Instructions Patient declined EKG chaperone

## 2022-05-31 NOTE — Telephone Encounter (Signed)
Patient has scheduled her Echo for 06/11, but is wanting to confirm Dr. Bing Matter didn't want it to be 2 to 3 months out. Please advise.

## 2022-05-31 NOTE — Addendum Note (Signed)
Addended by: Roxanne Mins I on: 05/31/2022 09:42 AM   Modules accepted: Orders

## 2022-05-31 NOTE — Telephone Encounter (Signed)
Called patient and she stated that someone had already called and rescheduled her echo for September. Patient was appreciative for the call and had no further questions at this time.

## 2022-06-01 ENCOUNTER — Other Ambulatory Visit: Payer: 59

## 2022-06-01 DIAGNOSIS — E039 Hypothyroidism, unspecified: Secondary | ICD-10-CM

## 2022-06-02 LAB — TSH: TSH: 0.21 mIU/L — ABNORMAL LOW (ref 0.40–4.50)

## 2022-06-03 ENCOUNTER — Ambulatory Visit: Payer: 59 | Admitting: Internal Medicine

## 2022-06-03 VITALS — BP 110/80 | HR 83 | Temp 98.0°F | Ht 62.0 in | Wt 154.8 lb

## 2022-06-03 DIAGNOSIS — E039 Hypothyroidism, unspecified: Secondary | ICD-10-CM | POA: Diagnosis not present

## 2022-06-03 DIAGNOSIS — R7989 Other specified abnormal findings of blood chemistry: Secondary | ICD-10-CM

## 2022-06-03 MED ORDER — LEVOTHYROXINE SODIUM 88 MCG PO TABS: 88.0000 ug | ORAL_TABLET | Freq: Every day | ORAL | 0 refills | Status: DC

## 2022-06-03 NOTE — Patient Instructions (Signed)
Change levothyroxine 88 mcg daily. Recheck  free T4, TSH in 6 weeks.

## 2022-06-05 ENCOUNTER — Encounter: Payer: Self-pay | Admitting: Internal Medicine

## 2022-06-25 ENCOUNTER — Encounter (HOSPITAL_COMMUNITY): Payer: Self-pay

## 2022-06-25 ENCOUNTER — Ambulatory Visit (HOSPITAL_COMMUNITY)
Admission: RE | Admit: 2022-06-25 | Discharge: 2022-06-25 | Disposition: A | Discharge status: Home / Self Care | Payer: 59 | Source: Ambulatory Visit | Attending: Internal Medicine | Admitting: Internal Medicine

## 2022-06-25 ENCOUNTER — Other Ambulatory Visit (HOSPITAL_COMMUNITY): Payer: Self-pay

## 2022-06-25 VITALS — BP 106/77 | HR 85 | Temp 98.0°F | Resp 17

## 2022-06-25 DIAGNOSIS — M545 Low back pain, unspecified: Secondary | ICD-10-CM | POA: Diagnosis not present

## 2022-06-25 DIAGNOSIS — E860 Dehydration: Secondary | ICD-10-CM | POA: Diagnosis not present

## 2022-06-25 LAB — POCT URINALYSIS DIP (MANUAL ENTRY)
Bilirubin, UA: NEGATIVE
Blood, UA: NEGATIVE
Glucose, UA: NEGATIVE mg/dL
Ketones, POC UA: NEGATIVE mg/dL
Leukocytes, UA: NEGATIVE
Nitrite, UA: NEGATIVE
Spec Grav, UA: >=1.03 — AB (ref 1.010–1.025)
Urobilinogen, UA: 0.2 E.U./dL
pH, UA: 6 (ref 5.0–8.0)

## 2022-06-25 NOTE — ED Provider Notes (Signed)
[facility]    CSN: [id number] Arrival date & time: 06/25/22  1350      History   Chief Complaint Chief Complaint  Patient presents with   Back Pain    Lower back pain in the kidney area...some fluid retention in hands - Entered by patient    HPI Rebecca Mccoy is a 57 y.o. female.   Patient presents to urgent care for evaluation of lower back pain that started 2 days ago. She states she has had decreased urine output and wonders if she could have a kidney stone. History of kidney stones but states they usually present with abdominal/pelvic pain. Pain to the lower back is on both sides of the spine but does not radiate and does not affect the spine itself. No recent injuries/trauma to the low back. No saddle anesthesia symptoms. Denies gross hematuria, dysuria, urinary frequency, and urgency. Over the last 2 days, she has been voiding approximately 3-5 times per day. No urinary hesitancy but states she has been voiding in smaller amounts. Reports vaginal itching as well. Denies vaginal odor and no discharge. Slight nausea today that comes and goes. Reports intermittent headache over the last 2 days as well. She works outside in the heat 2 days as week. She drinks approximately 40-60oz of water per day. She tries to drink more water on the days when she's outside. She has not attempted use of any OTC medications before coming to urgent care.    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 08/14/2019   Dyslipidemia 07/03/2018   Fluid retention    GERD (gastroesophageal reflux disease)    GERD (gastroesophageal reflux disease)    Heart murmur    infant   High cholesterol    History of migraine headaches 02/17/2014   Hx of adenomatous polyp of colon 07/09/2016   Sessile serrated polyp 11/17   Hypertension    Hypothyroidism  12/03/2015   Insomnia 07/30/2016   Irregular heart beat    Migraines    Non-allergic rhinitis 06/10/2017   OSA (obstructive sleep apnea) 10/04/2017   PID (pelvic inflammatory disease)    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   Recurrent upper respiratory infection (URI)    April 2019   Sleep apnea    Supraventricular tachycardia 08/22/2018   Thyroid disease    Urticaria     Patient Active Problem List   Diagnosis Date Noted   Polyphagia 05/27/2022   History of obesity 05/27/2022   Daytime somnolence 05/27/2022   Dilated cardiomyopathy (HCC) 02/04/2021   Iron deficiency anemia 11/14/2020   Prediabetes 10/03/2020   Urticaria    Thyroid disease    Sleep apnea    Recurrent upper respiratory infection (URI)    PID (pelvic inflammatory disease)    Migraines    Irregular heart beat    Hypertension    High cholesterol    Heart murmur    GERD (gastroesophageal reflux disease)    Fluid retention    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 08/14/2019   Supraventricular tachycardia 08/22/2018   Atypical chest pain 07/03/2018   Dyslipidemia 07/03/2018   OSA (obstructive sleep apnea) 10/04/2017   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   Non-allergic rhinitis 06/10/2017   Insomnia  07/30/2016   Hx of adenomatous polyp of colon 07/09/2016   Hypothyroidism 12/03/2015    Past Surgical History:  Procedure Laterality Date   AUGMENTATION MAMMAPLASTY Bilateral 2005   BREAST SURGERY  2005   Augmentation   CARPAL TUNNEL RELEASE Bilateral    R in '07 and L '06   COLONOSCOPY  2017   COSMETIC SURGERY     leg lift     TONSILLECTOMY  1972   TUBAL LIGATION  1989   tummy tuck  2005    OB History     Gravida  2   Para  2   Term  2   Preterm      AB  0   Living  2      SAB      IAB      Ectopic  0   Multiple      Live Births  2            Home Medications    Prior to Admission  medications   Medication Sig Start Date End Date Taking? Authorizing Provider  albuterol (VENTOLIN HFA) 108 (90 Base) MCG/ACT inhaler Inhale 1-2 puffs into the lungs every 6 (six) hours as needed for wheezing or shortness of breath. 01/13/21   Margaree Mackintosh, MD  ALPRAZolam Prudy Feeler) 1 MG tablet Take 1 tablet by mouth twice daily as needed for anxiety Patient taking differently: Take 1 mg by mouth at bedtime. Half a tablet at night 01/12/22   Margaree Mackintosh, MD  aspirin EC 81 MG tablet Take 1 tablet (81 mg total) by mouth daily. Swallow whole. 04/09/21   Georgeanna Lea, MD  B Complex Vitamins (B COMPLEX 100 PO) Take 1 capsule by mouth daily. Unknown strength    [provider]  budesonide-formoterol (SYMBICORT) 160-4.5 MCG/ACT inhaler Inhale 2 puffs into the lungs 2 (two) times daily. Patient taking differently: Inhale 2 puffs into the lungs as needed. 01/13/21   Margaree Mackintosh, MD  glucosamine-chondroitin 500-400 MG tablet Take 1 tablet by mouth daily.    [provider]  levothyroxine (SYNTHROID) 88 MCG tablet Take 1 tablet (88 mcg total) by mouth daily. 06/03/22   Margaree Mackintosh, MD  losartan (COZAAR) 25 MG tablet Take 1 tablet (25 mg total) by mouth 2 (two) times daily. Patient taking differently: Take 25 mg by mouth daily. 07/28/21   Georgeanna Lea, MD  metoprolol succinate (TOPROL-XL) 50 MG 24 hr tablet Take 0.5 tablets (25 mg total) by mouth daily. Take with or immediately following a meal. 12/28/21   Georgeanna Lea, MD  Multiple Vitamin (MULTIVITAMIN) capsule Take 1 capsule by mouth daily. Unknown strength    [provider]  Omega-3 Fatty Acids (FISH OIL) 1000 MG CAPS Take 2 capsules by mouth daily.    [provider]  Probiotic Product (PROBIOTIC DAILY PO) Take 1 tablet by mouth daily. Unknown strength    [provider]  rosuvastatin (CRESTOR) 5 MG tablet TAKE 1 TABLET BY MOUTH THREE TIMES A WEEK Patient taking differently: Take 5 mg  by mouth 3 (three) times a week. 10/12/21   Margaree Mackintosh, MD  Semaglutide-Weight Management (WEGOVY) 2.4 MG/0.75ML SOAJ Inject 2.4 mg into the skin once a week. 01/01/22     TURMERIC PO Take 1 tablet by mouth daily. Unknown strength    [provider]    Family History Family History  Problem Relation Age of Onset   Diabetes Mother  Uterine cancer Mother    Colon polyps Mother    Allergic rhinitis Mother    Hypertension Mother    Thyroid disease Mother    Obesity Mother    Pancreatic cancer Father    Hyperlipidemia Father    Hypertension Father    Crohn's disease Cousin    Diabetes Maternal Grandmother    Stroke Maternal Grandmother    Diabetes Maternal Grandfather    Stroke Paternal Grandmother    Colon cancer Neg Hx    Stomach cancer Neg Hx    Esophageal cancer Neg Hx    Rectal cancer Neg Hx    Liver cancer Neg Hx    Eczema Neg Hx    Urticaria Neg Hx    Asthma Neg Hx     Social History Social History   Tobacco Use   Smoking status: Never   Smokeless tobacco: Never  Vaping Use   Vaping Use: Never used  Substance Use Topics   Alcohol use: Yes    Alcohol/week: 0.0 - 2.0 standard drinks of alcohol    Comment: 1-2 beer weekly   Drug use: No     Allergies   Patient has no known allergies.   Review of Systems Review of Systems  Musculoskeletal:  Positive for back pain.  Per HPI   Physical Exam Triage Vital Signs ED Triage Vitals  Enc Vitals Group     BP 06/25/22 1418 106/77     Pulse Rate 06/25/22 1418 85     Resp 06/25/22 1418 17     Temp 06/25/22 1418 98 F (36.7 C)     Temp Source 06/25/22 1418 Oral     SpO2 06/25/22 1418 98 %     Weight --      Height --      Head Circumference --      Peak Flow --      Pain Score 06/25/22 1417 5     Pain Loc --      Pain Edu? --      Excl. in GC? --    No data found.  Updated Vital Signs BP 106/77 (BP Location: Left Arm)   Pulse 85   Temp 98 F (36.7 C) (Oral)   Resp 17   LMP  (LMP  Unknown)   SpO2 98%   Visual Acuity Right Eye Distance:   Left Eye Distance:   Bilateral Distance:    Right Eye Near:   Left Eye Near:    Bilateral Near:     Physical Exam Vitals and nursing note reviewed.  Constitutional:      Appearance: She is not ill-appearing or toxic-appearing.  HENT:     Head: Normocephalic and atraumatic.     Right Ear: Hearing and external ear normal.     Left Ear: Hearing and external ear normal.     Nose: Nose normal.     Mouth/Throat:     Lips: Pink.     Mouth: Mucous membranes are moist. No injury.     Tongue: No lesions. Tongue does not deviate from midline.     Palate: No mass and lesions.     Pharynx: Oropharynx is clear. Uvula midline. No pharyngeal swelling, oropharyngeal exudate, posterior oropharyngeal erythema or uvula swelling.     Tonsils: No tonsillar exudate or tonsillar abscesses.  Eyes:     General: Lids are normal. Vision grossly intact. Gaze aligned appropriately.     Extraocular Movements: Extraocular movements intact.     Conjunctiva/sclera: Conjunctivae  normal.  Pulmonary:     Effort: Pulmonary effort is normal.  Abdominal:     General: Abdomen is flat. Bowel sounds are normal.     Palpations: Abdomen is soft.     Tenderness: There is no abdominal tenderness. There is no right CVA tenderness, left CVA tenderness or guarding.  Musculoskeletal:     Cervical back: Normal and neck supple.     Thoracic back: Normal.     Lumbar back: Tenderness (TTP to the bilateral lumbar paraspinals) present. No swelling, edema, deformity, signs of trauma, lacerations, spasms or bony tenderness. Normal range of motion. No scoliosis.  Skin:    General: Skin is warm and dry.     Capillary Refill: Capillary refill takes less than 2 seconds.     Findings: No rash.  Neurological:     General: No focal deficit present.     Mental Status: She is alert and oriented to person, place, and time. Mental status is at baseline.     Cranial Nerves: No  dysarthria or facial asymmetry.     Comments: Strength and sensation intact to bilateral lower extremities.   Psychiatric:        Mood and Affect: Mood normal.        Speech: Speech normal.        Behavior: Behavior normal.        Thought Content: Thought content normal.        Judgment: Judgment normal.      UC Treatments / Results  Labs (all labs ordered are listed, but only abnormal results are displayed) Labs Reviewed  POCT URINALYSIS DIP (MANUAL ENTRY) - Abnormal; Notable for the following components:      Result Value   Color, UA straw (*)    Spec Grav, UA >=1.030 (*)    Protein Ur, POC trace (*)    All other components within normal limits    EKG   Radiology No results found.  Procedures Procedures (including critical care time)  Medications Ordered in UC Medications - No data to display  Initial Impression / Assessment and Plan / UC Course  I have reviewed the triage vital signs and the nursing notes.  Pertinent labs & imaging results that were available during my care of the patient were reviewed by me and considered in my medical decision making (see chart for details).   1.  Acute bilateral low back pain without sciatica, dehydration Low back discomfort is likely musculoskeletal in nature.  Urinalysis is unremarkable for signs of urinary tract infection but does show significant dehydration.  Encouraged patient to increase water intake to stay well-hydrated.  May use Tylenol/ibuprofen as needed for low back discomfort.  Low suspicion for nephrolithiasis etiology. She is well-appearing with hemodynamically stable vital signs. May follow-up with PCP as needed.  Discussed physical exam and available lab work findings in clinic with patient.  Counseled patient regarding appropriate use of medications and potential side effects for all medications recommended or prescribed today. Discussed red flag signs and symptoms of worsening condition,when to call the PCP  office, return to urgent care, and when to seek higher level of care in the emergency department. Patient verbalizes understanding and agreement with plan. All questions answered. Patient discharged in stable condition.    Final Clinical Impressions(s) / UC Diagnoses   Final diagnoses:  Acute bilateral low back pain without sciatica  Dehydration     Discharge Instructions      Your symptoms are likely due to dehydration.  Drink plenty of water (64 ounces of water per day, more if sweating/outside).  You may take Tylenol/ibuprofen as needed for low back discomfort.  Apply heat to the low back to improve low back pain as well 20 minutes on 20 minutes off as needed.  Follow-up with family practice provider as needed.  Return to urgent care as needed.     ED Prescriptions   None    PDMP not reviewed this encounter.   Carlisle Beers, [place] 06/25/22 [phone]

## 2022-06-25 NOTE — ED Triage Notes (Signed)
Pt c/o lower back pains for 2 nights. Reports worse at night. Reports urinating less here lately. Denies dysuria. Reports little itching in vaginal area. Had little nausea and headache today.  Hx kidney stones.  Hasn't taken medications for pain

## 2022-06-25 NOTE — Discharge Instructions (Signed)
Your symptoms are likely due to dehydration.  Drink plenty of water (64 ounces of water per day, more if sweating/outside).  You may take Tylenol/ibuprofen as needed for low back discomfort.  Apply heat to the low back to improve low back pain as well 20 minutes on 20 minutes off as needed.  Follow-up with family practice provider as needed.  Return to urgent care as needed.

## 2022-06-29 ENCOUNTER — Other Ambulatory Visit (HOSPITAL_BASED_OUTPATIENT_CLINIC_OR_DEPARTMENT_OTHER): Payer: 59

## 2022-07-15 ENCOUNTER — Other Ambulatory Visit (HOSPITAL_COMMUNITY): Payer: Self-pay

## 2022-07-15 ENCOUNTER — Other Ambulatory Visit: Payer: Self-pay | Admitting: Internal Medicine

## 2022-07-15 ENCOUNTER — Other Ambulatory Visit: Payer: 59

## 2022-07-15 DIAGNOSIS — E039 Hypothyroidism, unspecified: Secondary | ICD-10-CM

## 2022-07-16 LAB — TSH: TSH: 3.86 mIU/L (ref 0.40–4.50)

## 2022-07-16 LAB — T4, FREE: Free T4: 1.2 ng/dL (ref 0.8–1.8)

## 2022-07-19 ENCOUNTER — Other Ambulatory Visit (HOSPITAL_COMMUNITY): Payer: Self-pay

## 2022-07-29 ENCOUNTER — Other Ambulatory Visit (HOSPITAL_COMMUNITY): Payer: Self-pay

## 2022-07-29 MED ORDER — WEGOVY 2.4 MG/0.75ML ~~LOC~~ SOAJ
2.4000 mg | SUBCUTANEOUS | 1 refills | Status: DC
  Filled 2022-07-29 – 2022-08-21 (×2): qty 3, 28d supply, fill #0
  Filled 2022-09-21: qty 3, 28d supply, fill #1

## 2022-08-21 ENCOUNTER — Other Ambulatory Visit: Payer: Self-pay | Admitting: Internal Medicine

## 2022-08-22 ENCOUNTER — Other Ambulatory Visit: Payer: Self-pay | Admitting: Internal Medicine

## 2022-08-22 ENCOUNTER — Other Ambulatory Visit (HOSPITAL_COMMUNITY): Payer: Self-pay

## 2022-08-23 ENCOUNTER — Other Ambulatory Visit: Payer: Self-pay

## 2022-09-11 ENCOUNTER — Other Ambulatory Visit: Payer: Self-pay | Admitting: Cardiology

## 2022-09-15 ENCOUNTER — Other Ambulatory Visit: Payer: Self-pay | Admitting: Internal Medicine

## 2022-09-15 DIAGNOSIS — Z1231 Encounter for screening mammogram for malignant neoplasm of breast: Secondary | ICD-10-CM

## 2022-09-22 ENCOUNTER — Other Ambulatory Visit (HOSPITAL_COMMUNITY): Payer: Self-pay

## 2022-09-24 ENCOUNTER — Ambulatory Visit
Admission: RE | Admit: 2022-09-24 | Discharge: 2022-09-24 | Disposition: A | Discharge status: Home / Self Care | Payer: 59 | Source: Ambulatory Visit | Attending: Internal Medicine | Admitting: Internal Medicine

## 2022-09-24 ENCOUNTER — Other Ambulatory Visit: Payer: Self-pay | Admitting: Internal Medicine

## 2022-09-24 DIAGNOSIS — Z1231 Encounter for screening mammogram for malignant neoplasm of breast: Secondary | ICD-10-CM

## 2022-09-30 ENCOUNTER — Other Ambulatory Visit (HOSPITAL_COMMUNITY): Payer: Self-pay

## 2022-09-30 MED ORDER — WEGOVY 2.4 MG/0.75ML ~~LOC~~ SOAJ
2.4000 mg | SUBCUTANEOUS | 1 refills | Status: DC
  Filled 2022-10-20: qty 3, 28d supply, fill #0
  Filled 2022-11-18: qty 3, 28d supply, fill #1

## 2022-10-05 ENCOUNTER — Ambulatory Visit (HOSPITAL_COMMUNITY): Payer: 59 | Attending: Cardiology

## 2022-10-05 DIAGNOSIS — I10 Essential (primary) hypertension: Secondary | ICD-10-CM | POA: Insufficient documentation

## 2022-10-05 DIAGNOSIS — I471 Supraventricular tachycardia, unspecified: Secondary | ICD-10-CM | POA: Diagnosis present

## 2022-10-05 DIAGNOSIS — G4733 Obstructive sleep apnea (adult) (pediatric): Secondary | ICD-10-CM | POA: Diagnosis present

## 2022-10-05 DIAGNOSIS — E785 Hyperlipidemia, unspecified: Secondary | ICD-10-CM | POA: Insufficient documentation

## 2022-10-05 DIAGNOSIS — I420 Dilated cardiomyopathy: Secondary | ICD-10-CM | POA: Insufficient documentation

## 2022-10-05 LAB — ECHOCARDIOGRAM COMPLETE
Area-P 1/2: 3.83 cm2
S' Lateral: 2.7 cm

## 2022-10-18 ENCOUNTER — Telehealth: Payer: Self-pay

## 2022-10-18 NOTE — Telephone Encounter (Signed)
Echo Results reviewed with pt as per Dr. Krasowski's note.  Pt verbalized understanding and had no additional questions. Routed to PCP 

## 2022-10-21 ENCOUNTER — Other Ambulatory Visit (HOSPITAL_COMMUNITY): Payer: Self-pay

## 2022-11-01 DIAGNOSIS — D649 Anemia, unspecified: Secondary | ICD-10-CM | POA: Insufficient documentation

## 2022-11-04 ENCOUNTER — Other Ambulatory Visit: Payer: Self-pay | Admitting: Cardiology

## 2022-11-04 ENCOUNTER — Ambulatory Visit: Payer: 59 | Attending: Cardiology | Admitting: Cardiology

## 2022-11-04 ENCOUNTER — Telehealth: Payer: Self-pay | Admitting: Cardiology

## 2022-11-04 ENCOUNTER — Encounter: Payer: Self-pay | Admitting: Cardiology

## 2022-11-04 VITALS — BP 102/78 | HR 84 | Ht 62.0 in | Wt 151.8 lb

## 2022-11-04 DIAGNOSIS — E785 Hyperlipidemia, unspecified: Secondary | ICD-10-CM

## 2022-11-04 DIAGNOSIS — I471 Supraventricular tachycardia, unspecified: Secondary | ICD-10-CM | POA: Diagnosis not present

## 2022-11-04 DIAGNOSIS — R0789 Other chest pain: Secondary | ICD-10-CM

## 2022-11-04 DIAGNOSIS — I10 Essential (primary) hypertension: Secondary | ICD-10-CM

## 2022-11-04 DIAGNOSIS — Z8639 Personal history of other endocrine, nutritional and metabolic disease: Secondary | ICD-10-CM

## 2022-11-04 DIAGNOSIS — I420 Dilated cardiomyopathy: Secondary | ICD-10-CM

## 2022-11-04 MED ORDER — RANOLAZINE ER 500 MG PO TB12: 500.0000 mg | ORAL_TABLET | Freq: Two times a day (BID) | ORAL | 3 refills | Status: DC

## 2022-11-04 MED ORDER — ROSUVASTATIN CALCIUM 5 MG PO TABS: 5.0000 mg | ORAL_TABLET | Freq: Every day | ORAL | Status: DC

## 2022-11-04 NOTE — Patient Instructions (Signed)
Medication Instructions:   START: Ranolazine 500mg  1 tablet twice daily  RE-START: Rosuvastatin 5mg  1 tablet daily   Lab Work: None Ordered If you have labs (blood work) drawn today and your tests are completely normal, you will receive your results only by: MyChart Message (if you have MyChart) OR A paper copy in the mail If you have any lab test that is abnormal or we need to change your treatment, we will call you to review the results.   Testing/Procedures: Your physician has requested that you have a Exercise Cardiolite. For further information please visit [email or web address] Please follow instruction sheet, as given.  The test will take approximately 3 to 4 hours to complete; you may bring reading material.  If someone comes with you to your appointment, they will need to remain in the main lobby due to limited space in the testing area.    How to prepare for your Myocardial Perfusion Test: Do not eat or drink 3 hours prior to your test, except you may have water. Do not consume products containing caffeine (regular or decaffeinated) 12 hours prior to your test. (ex: coffee, chocolate, sodas, tea). Do bring a list of your current medications with you.  If not listed below, you may take your medications as normal. Do wear comfortable clothes (no dresses or overalls) and walking shoes, tennis shoes preferred (No heels or open toe shoes are allowed). Do NOT wear cologne, perfume, aftershave, or lotions (deodorant is allowed). If these instructions are not followed, your test will have to be rescheduled.     Follow-Up: At [facility], you and your health needs are our priority.  As part of our continuing mission to provide you with exceptional heart care, we have created designated Provider Care Teams.  These Care Teams include your primary Cardiologist (physician) and Advanced Practice Providers (APPs -  Physician Assistants and Nurse Practitioners) who all work together to  provide you with the care you need, when you need it.  We recommend signing up for the patient portal called "MyChart".  Sign up information is provided on this After Visit Summary.  MyChart is used to connect with patients for Virtual Visits (Telemedicine).  Patients are able to view lab/test results, encounter notes, upcoming appointments, etc.  Non-urgent messages can be sent to your provider as well.   To learn more about what you can do with MyChart, go to [email or web address].    Your next appointment:   1 month(s)  The format for your next appointment:   In Person  Provider:   Gypsy Balsam, MD    Other Instructions NA

## 2022-11-04 NOTE — Progress Notes (Signed)
Cardiology Office Note:    Date:  11/04/2022   ID:  Rebecca Mccoy, DOB [birth year], MRN [id number]  PCP:  Margaree Mackintosh, MD  Cardiologist:  Gypsy Balsam, MD    Referring MD: Margaree Mackintosh, MD   No chief complaint on file.   History of Present Illness:    Rebecca Mccoy is a 57 y.o. female past medical history significant for nonischemic cardiomyopathy initial ejection fraction 45%, coronary CT angio has been performed after that which showed nonobstructive disease mild only additional problem include obstructive sleep apnea, history of obesity but she lost significant mount of weight, supraventricular tachycardia.  Comes today to months for follow-up.  Overall she said she doing fine but she complained of having some chest pain interesting pain look very suspicious she said when she go downstairs she is fine when she woke up so she will get heaviness in the chest have to stop weight and then sensation goes away due to being on for about a month not getting more frequently there is no resting there is no acceleration of the symptoms concerned about typical angina pectoris.  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 08/14/2019   Daytime somnolence 05/27/2022   Dilated cardiomyopathy (HCC) 02/04/2021   Dyslipidemia 07/03/2018   Fluid retention    GERD (gastroesophageal reflux disease)    GERD (gastroesophageal reflux disease)    Heart murmur    infant   High cholesterol    History of obesity 05/27/2022   Hx of adenomatous polyp of colon 07/09/2016   Sessile serrated polyp 11/17   Hypertension    Hypothyroidism 12/03/2015   Insomnia 07/30/2016   Iron deficiency anemia 11/14/2020   Irregular heart beat    Migraines    Non-allergic rhinitis 06/10/2017   OSA (obstructive sleep apnea) 10/04/2017   PID (pelvic inflammatory disease)    Polyphagia 05/27/2022    Prediabetes 10/03/2020   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   Recurrent upper respiratory infection (URI)    April 2019   Sleep apnea    Supraventricular tachycardia (HCC) 08/22/2018   Thyroid disease    Urticaria     Past Surgical History:  Procedure Laterality Date   AUGMENTATION MAMMAPLASTY Bilateral 2005   BREAST SURGERY  2005   Augmentation   CARPAL TUNNEL RELEASE Bilateral    R in '07 and L '06   COLONOSCOPY  2017   COSMETIC SURGERY     leg lift     TONSILLECTOMY  1972   TUBAL LIGATION  1989   tummy tuck  2005    Current Medications: Current Meds  Medication Sig   albuterol (VENTOLIN HFA) 108 (90 Base) MCG/ACT inhaler Inhale 1-2 puffs into the lungs every 6 (six) hours as needed for wheezing or shortness of breath.   ALPRAZolam (XANAX) 1 MG tablet Take 1 tablet by mouth twice daily as needed for anxiety   aspirin EC 81 MG tablet Take 1 tablet (81 mg total) by mouth daily. Swallow whole.   B Complex Vitamins (B COMPLEX 100 PO) Take 1 capsule by mouth daily. Unknown strength   budesonide-formoterol (SYMBICORT) 160-4.5 MCG/ACT inhaler Inhale 2 puffs into the lungs 2 (two) times daily.   glucosamine-chondroitin 500-400 MG tablet Take 1 tablet by mouth daily.   levothyroxine (SYNTHROID) 88 MCG tablet Take 1 tablet by mouth once  daily   losartan (COZAAR) 25 MG tablet Take 1 tablet (25 mg total) by mouth 2 (two) times daily.   metoprolol succinate (TOPROL-XL) 50 MG 24 hr tablet Take 0.5 tablets (25 mg total) by mouth daily. Take with or immediately following a meal.   Multiple Vitamin (MULTIVITAMIN) capsule Take 1 capsule by mouth daily. Unknown strength   Omega-3 Fatty Acids (FISH OIL) 1000 MG CAPS Take 2 capsules by mouth daily.   Probiotic Product (PROBIOTIC DAILY PO) Take 1 tablet by mouth daily. Unknown strength   Semaglutide-Weight Management (WEGOVY) 2.4 MG/0.75ML SOAJ Inject 2.4 mg into the skin once a week.   TURMERIC PO Take 1 tablet by mouth  daily. Unknown strength     Allergies:   Patient has no known allergies.   Social History   Socioeconomic History   Marital status: Married    Spouse name: Not on file   Number of children: 2   Years of education: Not on file   Highest education level: Associate degree: academic program  Occupational History   Occupation: Designer, industrial/product  Tobacco Use   Smoking status: Never   Smokeless tobacco: Never  Vaping Use   Vaping status: Never Used  Substance and Sexual Activity   Alcohol use: Yes    Alcohol/week: 0.0 - 2.0 standard drinks of alcohol    Comment: 1-2 beer weekly   Drug use: No   Sexual activity: Yes    Partners: Male    Birth control/protection: Surgical, Post-menopausal    Comment: INTERCOUSE AGE 2, SEXUAL PARTNERS MORE THAN 5  Other Topics Concern   Not on file  Social History Narrative   Not on file   Social Determinants of Health   Financial Resource Strain: Low Risk  (06/02/2022)   Overall Financial Resource Strain (CARDIA)    Difficulty of Paying Living Expenses: Not hard at all  Food Insecurity: No Food Insecurity (06/02/2022)   Hunger Vital Sign    Worried About Running Out of Food in the Last Year: Never true    Ran Out of Food in the Last Year: Never true  Transportation Needs: No Transportation Needs (06/02/2022)   PRAPARE - Administrator, Civil Service (Medical): No    Lack of Transportation (Non-Medical): No  Physical Activity: Insufficiently Active (06/02/2022)   Exercise Vital Sign    Days of Exercise per Week: 3 days    Minutes of Exercise per Session: 30 min  Stress: No Stress Concern Present (06/02/2022)   [employer] of Occupational Health - Occupational Stress Questionnaire    Feeling of Stress : Only a little  Social Connections: Socially Integrated (06/02/2022)   Social Connection and Isolation Panel [NHANES]    Frequency of Communication with Friends and Family: Twice a week    Frequency of Social  Gatherings with Friends and Family: Once a week    Attends Religious Services: More than 4 times per year    Active Member of [employer] or Organizations: Yes    Attends Engineer, structural: More than 4 times per year    Marital Status: Married     Family History: The patient's family history includes Allergic rhinitis in her mother; Colon polyps in her mother; Crohn's disease in her cousin; Diabetes in her maternal grandfather, maternal grandmother, and mother; Hyperlipidemia in her father; Hypertension in her father and mother; Obesity in her mother; Pancreatic cancer in her father; Stroke in her maternal grandmother and paternal grandmother; Thyroid disease in her mother;  Uterine cancer in her mother. There is no history of Colon cancer, Stomach cancer, Esophageal cancer, Rectal cancer, Liver cancer, Eczema, Urticaria, or Asthma. ROS:   Please see the history of present illness.    All 14 point review of systems negative except as described per history of present illness  EKGs/Labs/Other Studies Reviewed:    EKG Interpretation Date/Time:  Thursday November 04 2022 15:38:04 EDT Ventricular Rate:  84 PR Interval:  162 QRS Duration:  82 QT Interval:  360 QTC Calculation: 425 R Axis:   80  Text Interpretation: Normal sinus rhythm Normal ECG When compared with ECG of 02-Aug-2018 12:47, No significant change was found Confirmed by Gypsy Balsam [phone]) on 11/04/2022 3:48:58 PM    Recent Labs: 02/23/2022: ALT 16; BUN 13; Creatinine 0.9; Hemoglobin 13.8; Platelets 255; Potassium 4.4; Sodium 140 07/15/2022: TSH 3.86  Recent Lipid Panel    Component Value Date/Time   CHOL 193 09/25/2021 1147   TRIG 182 (H) 09/25/2021 1147   HDL 71 09/25/2021 1147   CHOLHDL 2.7 09/25/2021 1147   VLDL 22 07/26/2016 0914   LDLCALC 94 09/25/2021 1147    Physical Exam:    VS:  BP 102/78   Pulse 84   Ht 5\' 2"  (1.575 m)   Wt 151 lb 12.8 oz (68.9 kg)   LMP  (LMP Unknown)   SpO2 97%   BMI 27.76  kg/m     Wt Readings from Last 3 Encounters:  11/04/22 151 lb 12.8 oz (68.9 kg)  06/03/22 154 lb 12.8 oz (70.2 kg)  05/31/22 155 lb (70.3 kg)     GEN:  Well nourished, well developed in no acute distress HEENT: Normal NECK: No JVD; No carotid bruits LYMPHATICS: No lymphadenopathy CARDIAC: RRR, no murmurs, no rubs, no gallops RESPIRATORY:  Clear to auscultation without rales, wheezing or rhonchi  ABDOMEN: Soft, non-tender, non-distended MUSCULOSKELETAL:  No edema; No deformity  SKIN: Warm and dry LOWER EXTREMITIES: no swelling NEUROLOGIC:  Alert and oriented x 3 PSYCHIATRIC:  Normal affect   ASSESSMENT:    1. Dyslipidemia   2. Dilated cardiomyopathy (HCC)   3. Supraventricular tachycardia (HCC)   4. Primary hypertension   5. History of obesity    PLAN:    In order of problems listed above:  Chest pain very suspicious history of coronary CT angio done in January last year showing mild disease involving LAD.  Concern about worsening of the problem especially in view of the fact that she did stop her Crestor.  Plan will be to continue with aspirin, give her ranolazine 500 mg twice daily, we will schedule her to have exercise Cardiolite. Dyslipidemia I did review her cholesterol checked was just recently on October 26, 2022 with LDL 140 HDL 74.  I asked her to go back on Crestor 5 until we have the situation clarified. Significant intentional weight loss and congratulated her on her prescription to continue doing this.  She is thinking about starting a strength program I told her to postpone it until we do stress test.   Medication Adjustments/Labs and Tests Ordered: Current medicines are reviewed at length with the patient today.  Concerns regarding medicines are outlined above.  Orders Placed This Encounter  Procedures   EKG 12-Lead   Medication changes: No orders of the defined types were placed in this encounter.   Signed, Georgeanna Lea, MD, [facility] 11/04/2022 4:02  PM    [facility]

## 2022-11-04 NOTE — Telephone Encounter (Signed)
Pt c/o medication issue:  1. Name of Medication:   ranolazine (RANEXA) 500 MG 12 hr tablet   2. How are you currently taking this medication (dosage and times per day)?   3. Are you having a reaction (difficulty breathing--STAT)?   4. What is your medication issue?   Caller Misty Stanley) stated patient is prescribed Ranexa and ALPRAZolam (XANAX) 1 MG tablet and wants to confirm patient can take these two medications.

## 2022-11-05 NOTE — Telephone Encounter (Signed)
Called patient and informed here that the Ranexa may increase the concentrations of Xanax. Spoke to Dr. Bing Matter and he recommended to have try taking the medications and if she felt bad then she should contact her PCP. I relayed Dr. Vanetta Shawl recommendation to the patient and she verbalized understanding and had no further questions at this time.

## 2022-11-05 NOTE — Telephone Encounter (Signed)
Ranexa increases concentrations of Xanax, may need dose reduction of her Xanax. Not contraindicated to use together though.

## 2022-11-09 ENCOUNTER — Ambulatory Visit: Payer: 59 | Attending: Cardiology

## 2022-11-09 DIAGNOSIS — R0789 Other chest pain: Secondary | ICD-10-CM | POA: Diagnosis not present

## 2022-11-09 MED ORDER — TECHNETIUM TC 99M TETROFOSMIN IV KIT
8.2000 | PACK | Freq: Once | INTRAVENOUS | Status: AC | PRN
  Administered 2022-11-09: 8.2 via INTRAVENOUS

## 2022-11-09 MED ORDER — TECHNETIUM TC 99M TETROFOSMIN IV KIT
23.3000 | PACK | Freq: Once | INTRAVENOUS | Status: AC | PRN
  Administered 2022-11-09: 23.3 via INTRAVENOUS

## 2022-11-11 LAB — MYOCARDIAL PERFUSION IMAGING
Angina Index: 0
Duke Treadmill Score: 6
Estimated workload: 7.7
Exercise duration (min): 6 min
Exercise duration (sec): 15 s
LV dias vol: 70 mL (ref 46–106)
LV sys vol: 32 mL
MPHR: 163 {beats}/min
Nuc Stress EF: 54 %
Peak HR: 141 {beats}/min
Percent HR: 86 %
RPE: 19
Rest HR: 84 {beats}/min
Rest Nuclear Isotope Dose: 8.2 mCi
SDS: 3
SRS: 0
SSS: 3
ST Depression (mm): 0 mm
Stress Nuclear Isotope Dose: 23.3 mCi
TID: 0.95

## 2022-11-12 ENCOUNTER — Telehealth: Payer: Self-pay

## 2022-11-12 ENCOUNTER — Telehealth: Payer: Self-pay | Admitting: Cardiology

## 2022-11-12 NOTE — Telephone Encounter (Signed)
Spoke with pt regarding stress test results. Advised that Dr. Bing Matter would like her to make appt to discuss the results. Sent to front desk for appt. Appt made for next week.

## 2022-11-12 NOTE — Telephone Encounter (Signed)
Patient would like to discuss stress test results + based on results, whether or not she is cleared to go on a trip to the [place] by airplane, 12/06-12/13. Please advise if any restrictions or limitations.

## 2022-11-12 NOTE — Telephone Encounter (Signed)
Pt has appt to discuss abnormal stress test with Dr. Bing Matter next week.

## 2022-11-14 ENCOUNTER — Other Ambulatory Visit: Payer: Self-pay | Admitting: Internal Medicine

## 2022-11-16 ENCOUNTER — Other Ambulatory Visit: Payer: Self-pay | Admitting: Internal Medicine

## 2022-11-18 ENCOUNTER — Ambulatory Visit: Payer: 59 | Attending: Cardiology | Admitting: Cardiology

## 2022-11-18 ENCOUNTER — Telehealth: Payer: Self-pay | Admitting: Cardiology

## 2022-11-18 ENCOUNTER — Encounter: Payer: Self-pay | Admitting: Cardiology

## 2022-11-18 VITALS — BP 98/74 | HR 93 | Ht 62.0 in | Wt 150.0 lb

## 2022-11-18 DIAGNOSIS — E785 Hyperlipidemia, unspecified: Secondary | ICD-10-CM

## 2022-11-18 DIAGNOSIS — I10 Essential (primary) hypertension: Secondary | ICD-10-CM | POA: Diagnosis not present

## 2022-11-18 DIAGNOSIS — R9439 Abnormal result of other cardiovascular function study: Secondary | ICD-10-CM

## 2022-11-18 HISTORY — DX: Abnormal result of other cardiovascular function study: R94.39

## 2022-11-18 MED ORDER — ISOSORBIDE MONONITRATE ER 30 MG PO TB24: 30.0000 mg | ORAL_TABLET | Freq: Every day | ORAL | 3 refills | Status: DC

## 2022-11-18 MED ORDER — NITROGLYCERIN 0.4 MG SL SUBL: 0.4000 mg | SUBLINGUAL_TABLET | SUBLINGUAL | 6 refills | Status: AC | PRN

## 2022-11-18 NOTE — Progress Notes (Signed)
Cardiology Office Note:    Date:  11/18/2022   ID:  Rebecca Mccoy, DOB May 07, 1965, MRN [id number]  PCP:  Margaree Mackintosh, MD  Cardiologist:  Gypsy Balsam, MD    Referring MD: Margaree Mackintosh, MD   Chief Complaint  Patient presents with   Results    History of Present Illness:    Rebecca Mccoy is a 57 y.o. female past medical history significant for nonischemic cardiomyopathy with initial ejection fraction 45% which was diagnosed January 2023, coronary CT angio has been performed at that time and showed nonobstructive disease.  Additional problem include obesity but she lost significant mount of weight, obstructive sleep apnea, supraventricular tachycardia.  She came to me a few weeks ago complaining of having heaviness in chest story was very suspicious she tells me that when she walks down stairs she is fine when she woke up started she will have heaviness in the chest that heaviness will last about 1 minute and goes away also sometimes when she is sitting in the choir at church she will develop the same tightness in the chest.  We elected to proceed with a stress testing.  Stress test show ischemia involving LAD territory.  She had also echocardiogram done which showed preserved left ventricle ejection fraction with no significant valvular pathology.  She comes today to talk about those issues still complain having sharp chest pain with exertion.  I gave her ranolazine but she cannot tell that this much difference.  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 08/14/2019   Daytime somnolence 05/27/2022   Dilated cardiomyopathy (HCC) 02/04/2021   Dyslipidemia 07/03/2018   Fluid retention    GERD (gastroesophageal reflux disease)    GERD (gastroesophageal reflux disease)    Heart murmur    infant   High cholesterol    History of obesity 05/27/2022   Hx of  adenomatous polyp of colon 07/09/2016   Sessile serrated polyp 11/17   Hypertension    Hypothyroidism 12/03/2015   Insomnia 07/30/2016   Iron deficiency anemia 11/14/2020   Irregular heart beat    Migraines    Non-allergic rhinitis 06/10/2017   OSA (obstructive sleep apnea) 10/04/2017   PID (pelvic inflammatory disease)    Polyphagia 05/27/2022   Prediabetes 10/03/2020   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   Recurrent upper respiratory infection (URI)    April 2019   Sleep apnea    Supraventricular tachycardia (HCC) 08/22/2018   Thyroid disease    Urticaria     Past Surgical History:  Procedure Laterality Date   AUGMENTATION MAMMAPLASTY Bilateral 2005   BREAST SURGERY  2005   Augmentation   CARPAL TUNNEL RELEASE Bilateral    R in '07 and L '06   COLONOSCOPY  2017   COSMETIC SURGERY     leg lift     TONSILLECTOMY  1972   TUBAL LIGATION  1989   tummy tuck  2005    Current Medications: Current Meds  Medication Sig   albuterol (VENTOLIN HFA) 108 (90 Base) MCG/ACT inhaler Inhale 1-2 puffs into the lungs every 6 (six) hours as needed for wheezing or shortness of breath.   ALPRAZolam (XANAX) 1 MG tablet Take 1 tablet by mouth twice daily as needed for anxiety (Patient taking differently: Take 1 mg by mouth 2 (two) times daily as needed for  anxiety. for anxiety)   aspirin EC 81 MG tablet Take 1 tablet (81 mg total) by mouth daily. Swallow whole.   budesonide-formoterol (SYMBICORT) 160-4.5 MCG/ACT inhaler Inhale 2 puffs into the lungs 2 (two) times daily.   glucosamine-chondroitin 500-400 MG tablet Take 1 tablet by mouth daily.   levothyroxine (SYNTHROID) 88 MCG tablet Take 1 tablet by mouth once daily   losartan (COZAAR) 25 MG tablet Take 1 tablet (25 mg total) by mouth 2 (two) times daily.   metoprolol succinate (TOPROL-XL) 50 MG 24 hr tablet Take 0.5 tablets (25 mg total) by mouth daily. Take with or immediately following a meal.   Multiple Vitamin  (MULTIVITAMIN) capsule Take 1 capsule by mouth daily. Unknown strength   Omega-3 Fatty Acids (FISH OIL) 1000 MG CAPS Take 2 capsules by mouth daily.   ranolazine (RANEXA) 500 MG 12 hr tablet Take 1 tablet (500 mg total) by mouth 2 (two) times daily.   rosuvastatin (CRESTOR) 5 MG tablet TAKE 1 TABLET BY MOUTH THREE TIMES A WEEK (Patient taking differently: Take 5 mg by mouth 3 (three) times a week.)   Semaglutide-Weight Management (WEGOVY) 2.4 MG/0.75ML SOAJ Inject 2.4 mg into the skin once a week.   TURMERIC PO Take 1 tablet by mouth daily. Unknown strength   [DISCONTINUED] B Complex Vitamins (B COMPLEX 100 PO) Take 1 capsule by mouth daily. Unknown strength   [DISCONTINUED] Probiotic Product (PROBIOTIC DAILY PO) Take 1 tablet by mouth daily. Unknown strength     Allergies:   Patient has no known allergies.   Social History   Socioeconomic History   Marital status: Married    Spouse name: Not on file   Number of children: 2   Years of education: Not on file   Highest education level: Associate degree: academic program  Occupational History   Occupation: Designer, industrial/product  Tobacco Use   Smoking status: Never   Smokeless tobacco: Never  Vaping Use   Vaping status: Never Used  Substance and Sexual Activity   Alcohol use: Yes    Alcohol/week: 0.0 - 2.0 standard drinks of alcohol    Comment: 1-2 beer weekly   Drug use: No   Sexual activity: Yes    Partners: Male    Birth control/protection: Surgical, Post-menopausal    Comment: INTERCOUSE AGE 31, SEXUAL PARTNERS MORE THAN 5  Other Topics Concern   Not on file  Social History Narrative   Not on file   Social Determinants of Health   Financial Resource Strain: Low Risk  (06/02/2022)   Overall Financial Resource Strain (CARDIA)    Difficulty of Paying Living Expenses: Not hard at all  Food Insecurity: No Food Insecurity (06/02/2022)   Hunger Vital Sign    Worried About Running Out of Food in the Last Year: Never true     Ran Out of Food in the Last Year: Never true  Transportation Needs: No Transportation Needs (06/02/2022)   PRAPARE - Administrator, Civil Service (Medical): No    Lack of Transportation (Non-Medical): No  Physical Activity: Insufficiently Active (06/02/2022)   Exercise Vital Sign    Days of Exercise per Week: 3 days    Minutes of Exercise per Session: 30 min  Stress: No Stress Concern Present (06/02/2022)   [employer] of Occupational Health - Occupational Stress Questionnaire    Feeling of Stress : Only a little  Social Connections: Socially Integrated (06/02/2022)   Social Connection and Isolation Panel [NHANES]    Frequency  of Communication with Friends and Family: Twice a week    Frequency of Social Gatherings with Friends and Family: Once a week    Attends Religious Services: More than 4 times per year    Active Member of [employer] or Organizations: Yes    Attends Engineer, structural: More than 4 times per year    Marital Status: Married     Family History: The patient's family history includes Allergic rhinitis in her mother; Colon polyps in her mother; Crohn's disease in her cousin; Diabetes in her maternal grandfather, maternal grandmother, and mother; Hyperlipidemia in her father; Hypertension in her father and mother; Obesity in her mother; Pancreatic cancer in her father; Stroke in her maternal grandmother and paternal grandmother; Thyroid disease in her mother; Uterine cancer in her mother. There is no history of Colon cancer, Stomach cancer, Esophageal cancer, Rectal cancer, Liver cancer, Eczema, Urticaria, or Asthma. ROS:   Please see the history of present illness.    All 14 point review of systems negative except as described per history of present illness  EKGs/Labs/Other Studies Reviewed:         Recent Labs: 02/23/2022: ALT 16; BUN 13; Creatinine 0.9; Hemoglobin 13.8; Platelets 255; Potassium 4.4; Sodium 140 07/15/2022: TSH 3.86  Recent  Lipid Panel    Component Value Date/Time   CHOL 193 09/25/2021 1147   TRIG 182 (H) 09/25/2021 1147   HDL 71 09/25/2021 1147   CHOLHDL 2.7 09/25/2021 1147   VLDL 22 07/26/2016 0914   LDLCALC 94 09/25/2021 1147    Physical Exam:    VS:  BP 98/74 (BP Location: Left Arm, Patient Position: Sitting)   Pulse 93   Ht 5\' 2"  (1.575 m)   Wt 150 lb (68 kg)   LMP  (LMP Unknown)   SpO2 98%   BMI 27.44 kg/m     Wt Readings from Last 3 Encounters:  11/18/22 150 lb (68 kg)  11/09/22 151 lb (68.5 kg)  11/04/22 151 lb 12.8 oz (68.9 kg)     GEN:  Well nourished, well developed in no acute distress HEENT: Normal NECK: No JVD; No carotid bruits LYMPHATICS: No lymphadenopathy CARDIAC: RRR, no murmurs, no rubs, no gallops RESPIRATORY:  Clear to auscultation without rales, wheezing or rhonchi  ABDOMEN: Soft, non-tender, non-distended MUSCULOSKELETAL:  No edema; No deformity  SKIN: Warm and dry LOWER EXTREMITIES: no swelling NEUROLOGIC:  Alert and oriented x 3 PSYCHIATRIC:  Normal affect   ASSESSMENT:    1. Abnormal stress test   2. Primary hypertension   3. Dyslipidemia    PLAN:    In order of problems listed above:  Abnormal stress test with ischemia involving LAD territory.  She does have symptoms that are very concerning we discussed option for this situation I think the best option would be to proceed directly to cardiac catheterization.  Procedure has been explained to her including all risk benefits as well as alternatives.  In the meantime I will stop her ranolazine but because it seems to not be helping, I will put her on Imdur 30 daily as well as nitroglycerin as needed.  She is already on antiplatelet therapy which I will continue.  I gave her instruction about taking nitroglycerin and told her to go to the emergency room or call 911 if pain does not go away with rest and nitroglycerin.  I did explain cardiac catheterization to her including all risk benefits as well as  alternatives.  She is ready to proceed. Essential  hypertension.  Blood pressure control actually blood per slightly low today.  Will continue monitoring. Dyslipidemia she is back on Crestor.   Medication Adjustments/Labs and Tests Ordered: Current medicines are reviewed at length with the patient today.  Concerns regarding medicines are outlined above.  No orders of the defined types were placed in this encounter.  Medication changes: No orders of the defined types were placed in this encounter.   Signed, Georgeanna Lea, MD, [facility] 11/18/2022 4:07 PM    [facility]

## 2022-11-18 NOTE — Patient Instructions (Signed)
Medication Instructions:   HOLD: Wegovy 1 week prior to procedure  HOLD: Cozaar - day of procedure  START: Imdur 30mg  1 tablet daily  START: Nitroglycerin- Use nitroglycerin 1 tablet placed under the tongue at the first sign of chest pain or an angina attack. 1 tablet may be used every 5 minutes as needed, for up to 15 minutes. Do not take more than 3 tablets in 15 minutes. If pain persist call 911 or go to the nearest ED.      *If you need a refill on your cardiac medications before your next appointment, please call your pharmacy*   Lab Work: BMP, CBC- today  If you have labs (blood work) drawn today and your tests are completely normal, you will receive your results only by: MyChart Message (if you have MyChart) OR A paper copy in the mail If you have any lab test that is abnormal or we need to change your treatment, we will call you to review the results.   Testing/Procedures:  [place] [facility] DEPT OF [facility] [place] [address] [place] [address] Dept: [phone] Loc: [phone]  Rebecca Mccoy  11/18/2022  You are scheduled for a Cardiac Catheterization on Thursday, November 7 with Dr. Cristal De[place] End.  1. Please arrive at the [facility] (Main Entrance A) at [facility]: [address] [place], [place] [address] at 7:00 AM (This time is 2 hour(s) before your procedure to ensure your preparation). Free valet parking service is available. You will check in at ADMITTING. The support person will be asked to wait in the waiting room.  It is OK to have someone drop you off and come back when you are ready to be discharged.    Special note: Every effort is made to have your procedure done on time. Please understand that emergencies sometimes delay scheduled procedures.  2. Diet: Do not eat solid foods after midnight.  The patient may have clear liquids until 5am upon the day of the procedure.  3.  Labs: You will need to have blood drawn on Thursday, October 31 at [facility]: [address], [place], advertising . You do not need to be fasting.  4. Medication instructions in preparation for your procedure:   Contrast Allergy: No   Current Outpatient Medications (Endocrine & Metabolic):    levothyroxine (SYNTHROID) 88 MCG tablet, Take 1 tablet by mouth once daily  Current Outpatient Medications (Cardiovascular):    losartan (COZAAR) 25 MG tablet, Take 1 tablet (25 mg total) by mouth 2 (two) times daily.   metoprolol succinate (TOPROL-XL) 50 MG 24 hr tablet, Take 0.5 tablets (25 mg total) by mouth daily. Take with or immediately following a meal.   ranolazine (RANEXA) 500 MG 12 hr tablet, Take 1 tablet (500 mg total) by mouth 2 (two) times daily.   rosuvastatin (CRESTOR) 5 MG tablet, TAKE 1 TABLET BY MOUTH THREE TIMES A WEEK (Patient taking differently: Take 5 mg by mouth 3 (three) times a week.)  Current Outpatient Medications (Respiratory):    albuterol (VENTOLIN HFA) 108 (90 Base) MCG/ACT inhaler, Inhale 1-2 puffs into the lungs every 6 (six) hours as needed for wheezing or shortness of breath.   budesonide-formoterol (SYMBICORT) 160-4.5 MCG/ACT inhaler, Inhale 2 puffs into the lungs 2 (two) times daily.  Current Outpatient Medications (Analgesics):    aspirin EC 81 MG tablet, Take 1 tablet (81 mg total) by mouth daily. Swallow whole.  Current Outpatient Medications (Other):    ALPRAZolam (XANAX) 1 MG tablet, Take 1 tablet by mouth twice daily as needed for anxiety (Patient taking differently: Take 1 mg by mouth 2 (two) times daily as needed for anxiety. for anxiety)   glucosamine-chondroitin 500-400 MG tablet, Take 1 tablet by mouth daily.   Multiple Vitamin (MULTIVITAMIN) capsule, Take 1 capsule by mouth daily. Unknown strength   Omega-3 Fatty Acids (FISH OIL) 1000 MG CAPS, Take 2 capsules by mouth daily.   Semaglutide-Weight Management (WEGOVY) 2.4 MG/0.75ML SOAJ, Inject 2.4 mg into  the skin once a week.   TURMERIC PO, Take 1 tablet by mouth daily. Unknown strength *For reference purposes while preparing patient instructions.   Delete this med list prior to printing instructions for patient.*    Stop taking, Cozaar (Losartan) Thursday, November 7,     On the morning of your procedure, take your Aspirin 81 mg and any morning medicines NOT listed above.  You may use sips of water.  5. Plan to go home the same day, you will only stay overnight if medically necessary. 6. Bring a current list of your medications and current insurance cards. 7. You MUST have a responsible person to drive you home. 8. Someone MUST be with you the first 24 hours after you arrive home or your discharge will be delayed. 9. Please wear clothes that are easy to get on and off and wear slip-on shoes.  Thank you for allowing [place] to care for you!   -- [facility]    Follow-Up: At [facility], you and your health needs are our priority.  As part of our continuing mission to provide you with exceptional heart care, we have created designated Provider Care Teams.  These Care Teams include your primary Cardiologist (physician) and Advanced Practice Providers (APPs -  Physician Assistants and Nurse Practitioners) who all work together to provide you with the care you need, when you need it.  We recommend signing up for the patient portal called "MyChart".  Sign up information is provided on this After Visit Summary.  MyChart is used to connect with patients for Virtual Visits (Telemedicine).  Patients are able to view lab/test results, encounter notes, upcoming appointments, etc.  Non-urgent messages can be sent to your provider as well.   To learn more about what you can do with MyChart, go to [email or web address].    Your next appointment:   2 month(s)  The format for your next appointment:   In Person  Provider:   Gypsy Balsam, MD   Other  Instructions  Coronary Angiogram With Stent Coronary angiogram with stent placement is a procedure to widen or open a narrow blood vessel of the heart (coronary artery). Arteries may become blocked by cholesterol buildup (plaques) in the lining of the artery wall. When a coronary artery becomes partially blocked, blood flow to that area decreases. This may lead to chest pain or a heart attack (myocardial infarction). A stent is a small piece of metal that looks like mesh or spring. Stent placement may be done as treatment after a heart attack, or to prevent a heart attack if a blocked artery is found by a coronary angiogram. Let your health care provider know about: Any allergies you have, including allergies to medicines or contrast dye. All medicines you are taking, including vitamins, herbs, eye drops, creams, and over-the-counter medicines. Any problems you or family members have had with anesthetic medicines. Any blood disorders you have. Any  surgeries you have had. Any medical conditions you have, including kidney problems or kidney failure. Whether you are pregnant or may be pregnant. Whether you are breastfeeding. What are the risks? Generally, this is a safe procedure. However, serious problems may occur, including: Damage to nearby structures or organs, such as the heart, blood vessels, or kidneys. A return of blockage. Bleeding, infection, or bruising at the insertion site. A collection of blood under the skin (hematoma) at the insertion site. A blood clot in another part of the body. Allergic reaction to medicines or dyes. Bleeding into the abdomen (retroperitoneal bleeding). Stroke (rare). Heart attack (rare). What happens before the procedure? Staying hydrated Follow instructions from your health care provider about hydration, which may include: Up to 2 hours before the procedure - you may continue to drink clear liquids, such as water, clear fruit juice, black coffee, and  plain tea.    Eating and drinking restrictions Follow instructions from your health care provider about eating and drinking, which may include: 8 hours before the procedure - stop eating heavy meals or foods, such as meat, fried foods, or fatty foods. 6 hours before the procedure - stop eating light meals or foods, such as toast or cereal. 2 hours before the procedure - stop drinking clear liquids. Medicines Ask your health care provider about: Changing or stopping your regular medicines. This is especially important if you are taking diabetes medicines or blood thinners. Taking medicines such as aspirin and ibuprofen. These medicines can thin your blood. Do not take these medicines unless your health care provider tells you to take them. Generally, aspirin is recommended before a thin tube, called a catheter, is passed through a blood vessel and inserted into the heart (cardiac catheterization). Taking over-the-counter medicines, vitamins, herbs, and supplements. General instructions Do not use any products that contain nicotine or tobacco for at least 4 weeks before the procedure. These products include cigarettes, e-cigarettes, and chewing tobacco. If you need help quitting, ask your health care provider. Plan to have someone take you home from the hospital or clinic. If you will be going home right after the procedure, plan to have someone with you for 24 hours. You may have tests and imaging procedures. Ask your health care provider: How your insertion site will be marked. Ask which artery will be used for the procedure. What steps will be taken to help prevent infection. These may include: Removing hair at the insertion site. Washing skin with a germ-killing soap. Taking antibiotic medicine. What happens during the procedure? An IV will be inserted into one of your veins. Electrodes may be placed on your chest to monitor your heart rate during the procedure. You will be given one or  more of the following: A medicine to help you relax (sedative). A medicine to numb the area (local anesthetic) for catheter insertion. A small incision will be made for catheter insertion. The catheter will be inserted into an artery using a guide wire. The location may be in your groin, your wrist, or the fold of your arm (near your elbow). An X-ray procedure (fluoroscopy) will be used to help guide the catheter to the opening of the heart arteries. A dye will be injected into the catheter. X-rays will be taken. The dye helps to show where any narrowing or blockages are located in the arteries. Tell your health care provider if you have chest pain or trouble breathing. A tiny wire will be guided to the blocked spot, and a balloon  will be inflated to make the artery wider. The stent will be expanded to crush the plaques into the wall of the vessel. The stent will hold the area open and improve the blood flow. Most stents have a drug coating to reduce the risk of the stent narrowing over time. The artery may be made wider using a drill, laser, or other tools that remove plaques. The catheter will be removed when the blood flow improves. The stent will stay where it was placed, and the lining of the artery will grow over it. A bandage (dressing) will be placed on the insertion site. Pressure will be applied to stop bleeding. The IV will be removed. This procedure may vary among health care providers and hospitals.    What happens after the procedure? Your blood pressure, heart rate, breathing rate, and blood oxygen level will be monitored until you leave the hospital or clinic. If the procedure is done through the leg, you will lie flat in bed for a few hours or for as long as told by your health care provider. You will be instructed not to bend or cross your legs. The insertion site and the pulse in your foot or wrist will be checked often. You may have more blood tests, X-rays, and a test that  records the electrical activity of your heart (electrocardiogram, or ECG). Do not drive for 24 hours if you were given a sedative during your procedure. Summary Coronary angiogram with stent placement is a procedure to widen or open a narrowed coronary artery. This is done to treat heart problems. Before the procedure, let your health care provider know about all the medical conditions and surgeries you have or have had. This is a safe procedure. However, some problems may occur, including damage to nearby structures or organs, bleeding, blood clots, or allergies. Follow your health care provider's instructions about eating, drinking, medicines, and other lifestyle changes, such as quitting tobacco use before the procedure. This information is not intended to replace advice given to you by your health care provider. Make sure you discuss any questions you have with your health care provider. Document Revised: 07/26/2018 Document Reviewed: 07/26/2018 Elsevier Patient Education  2021 [employer].

## 2022-11-18 NOTE — Telephone Encounter (Signed)
Pt c/o medication issue:  1. Name of Medication: ranolazine (RANEXA) 500 MG 12 hr tablet   2. How are you currently taking this medication (dosage and times per day)?   Take 1 tablet (500 mg total) by mouth 2 (two) times daily.    3. Are you having a reaction (difficulty breathing--STAT)? No  4. What is your medication issue? Pt wants to know if she needs to stop this medication. Please advise

## 2022-11-18 NOTE — Telephone Encounter (Signed)
Spoke with Dr. Bing Matter, pt advised to stop Ranexa and start Imdur. Pt verbalized understanding and had no further questions

## 2022-11-18 NOTE — Addendum Note (Signed)
Addended by: Baldo Ash D on: 11/18/2022 04:35 PM   Modules accepted: Orders

## 2022-11-18 NOTE — Addendum Note (Signed)
Addended by: Baldo Ash D on: 11/18/2022 05:13 PM   Modules accepted: Orders

## 2022-11-18 NOTE — H&P (View-Only) (Signed)
 Cardiology Office Note:    Date:  11/18/2022   ID:  Rebecca Mccoy, DOB May 07, 1965, MRN [id number]  PCP:  Margaree Mackintosh, MD  Cardiologist:  Gypsy Balsam, MD    Referring MD: Margaree Mackintosh, MD   Chief Complaint  Patient presents with   Results    History of Present Illness:    Rebecca Mccoy is a 57 y.o. female past medical history significant for nonischemic cardiomyopathy with initial ejection fraction 45% which was diagnosed January 2023, coronary CT angio has been performed at that time and showed nonobstructive disease.  Additional problem include obesity but she lost significant mount of weight, obstructive sleep apnea, supraventricular tachycardia.  She came to me a few weeks ago complaining of having heaviness in chest story was very suspicious she tells me that when she walks down stairs she is fine when she woke up started she will have heaviness in the chest that heaviness will last about 1 minute and goes away also sometimes when she is sitting in the choir at church she will develop the same tightness in the chest.  We elected to proceed with a stress testing.  Stress test show ischemia involving LAD territory.  She had also echocardiogram done which showed preserved left ventricle ejection fraction with no significant valvular pathology.  She comes today to talk about those issues still complain having sharp chest pain with exertion.  I gave her ranolazine but she cannot tell that this much difference.  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 08/14/2019   Daytime somnolence 05/27/2022   Dilated cardiomyopathy (HCC) 02/04/2021   Dyslipidemia 07/03/2018   Fluid retention    GERD (gastroesophageal reflux disease)    GERD (gastroesophageal reflux disease)    Heart murmur    infant   High cholesterol    History of obesity 05/27/2022   Hx of  adenomatous polyp of colon 07/09/2016   Sessile serrated polyp 11/17   Hypertension    Hypothyroidism 12/03/2015   Insomnia 07/30/2016   Iron deficiency anemia 11/14/2020   Irregular heart beat    Migraines    Non-allergic rhinitis 06/10/2017   OSA (obstructive sleep apnea) 10/04/2017   PID (pelvic inflammatory disease)    Polyphagia 05/27/2022   Prediabetes 10/03/2020   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   Recurrent upper respiratory infection (URI)    April 2019   Sleep apnea    Supraventricular tachycardia (HCC) 08/22/2018   Thyroid disease    Urticaria     Past Surgical History:  Procedure Laterality Date   AUGMENTATION MAMMAPLASTY Bilateral 2005   BREAST SURGERY  2005   Augmentation   CARPAL TUNNEL RELEASE Bilateral    R in '07 and L '06   COLONOSCOPY  2017   COSMETIC SURGERY     leg lift     TONSILLECTOMY  1972   TUBAL LIGATION  1989   tummy tuck  2005    Current Medications: Current Meds  Medication Sig   albuterol (VENTOLIN HFA) 108 (90 Base) MCG/ACT inhaler Inhale 1-2 puffs into the lungs every 6 (six) hours as needed for wheezing or shortness of breath.   ALPRAZolam (XANAX) 1 MG tablet Take 1 tablet by mouth twice daily as needed for anxiety (Patient taking differently: Take 1 mg by mouth 2 (two) times daily as needed for  anxiety. for anxiety)   aspirin EC 81 MG tablet Take 1 tablet (81 mg total) by mouth daily. Swallow whole.   budesonide-formoterol (SYMBICORT) 160-4.5 MCG/ACT inhaler Inhale 2 puffs into the lungs 2 (two) times daily.   glucosamine-chondroitin 500-400 MG tablet Take 1 tablet by mouth daily.   levothyroxine (SYNTHROID) 88 MCG tablet Take 1 tablet by mouth once daily   losartan (COZAAR) 25 MG tablet Take 1 tablet (25 mg total) by mouth 2 (two) times daily.   metoprolol succinate (TOPROL-XL) 50 MG 24 hr tablet Take 0.5 tablets (25 mg total) by mouth daily. Take with or immediately following a meal.   Multiple Vitamin  (MULTIVITAMIN) capsule Take 1 capsule by mouth daily. Unknown strength   Omega-3 Fatty Acids (FISH OIL) 1000 MG CAPS Take 2 capsules by mouth daily.   ranolazine (RANEXA) 500 MG 12 hr tablet Take 1 tablet (500 mg total) by mouth 2 (two) times daily.   rosuvastatin (CRESTOR) 5 MG tablet TAKE 1 TABLET BY MOUTH THREE TIMES A WEEK (Patient taking differently: Take 5 mg by mouth 3 (three) times a week.)   Semaglutide-Weight Management (WEGOVY) 2.4 MG/0.75ML SOAJ Inject 2.4 mg into the skin once a week.   TURMERIC PO Take 1 tablet by mouth daily. Unknown strength   [DISCONTINUED] B Complex Vitamins (B COMPLEX 100 PO) Take 1 capsule by mouth daily. Unknown strength   [DISCONTINUED] Probiotic Product (PROBIOTIC DAILY PO) Take 1 tablet by mouth daily. Unknown strength     Allergies:   Patient has no known allergies.   Social History   Socioeconomic History   Marital status: Married    Spouse name: Not on file   Number of children: 2   Years of education: Not on file   Highest education level: Associate degree: academic program  Occupational History   Occupation: Designer, industrial/product  Tobacco Use   Smoking status: Never   Smokeless tobacco: Never  Vaping Use   Vaping status: Never Used  Substance and Sexual Activity   Alcohol use: Yes    Alcohol/week: 0.0 - 2.0 standard drinks of alcohol    Comment: 1-2 beer weekly   Drug use: No   Sexual activity: Yes    Partners: Male    Birth control/protection: Surgical, Post-menopausal    Comment: INTERCOUSE AGE 31, SEXUAL PARTNERS MORE THAN 5  Other Topics Concern   Not on file  Social History Narrative   Not on file   Social Determinants of Health   Financial Resource Strain: Low Risk  (06/02/2022)   Overall Financial Resource Strain (CARDIA)    Difficulty of Paying Living Expenses: Not hard at all  Food Insecurity: No Food Insecurity (06/02/2022)   Hunger Vital Sign    Worried About Running Out of Food in the Last Year: Never true     Ran Out of Food in the Last Year: Never true  Transportation Needs: No Transportation Needs (06/02/2022)   PRAPARE - Administrator, Civil Service (Medical): No    Lack of Transportation (Non-Medical): No  Physical Activity: Insufficiently Active (06/02/2022)   Exercise Vital Sign    Days of Exercise per Week: 3 days    Minutes of Exercise per Session: 30 min  Stress: No Stress Concern Present (06/02/2022)   [employer] of Occupational Health - Occupational Stress Questionnaire    Feeling of Stress : Only a little  Social Connections: Socially Integrated (06/02/2022)   Social Connection and Isolation Panel [NHANES]    Frequency  of Communication with Friends and Family: Twice a week    Frequency of Social Gatherings with Friends and Family: Once a week    Attends Religious Services: More than 4 times per year    Active Member of [employer] or Organizations: Yes    Attends Engineer, structural: More than 4 times per year    Marital Status: Married     Family History: The patient's family history includes Allergic rhinitis in her mother; Colon polyps in her mother; Crohn's disease in her cousin; Diabetes in her maternal grandfather, maternal grandmother, and mother; Hyperlipidemia in her father; Hypertension in her father and mother; Obesity in her mother; Pancreatic cancer in her father; Stroke in her maternal grandmother and paternal grandmother; Thyroid disease in her mother; Uterine cancer in her mother. There is no history of Colon cancer, Stomach cancer, Esophageal cancer, Rectal cancer, Liver cancer, Eczema, Urticaria, or Asthma. ROS:   Please see the history of present illness.    All 14 point review of systems negative except as described per history of present illness  EKGs/Labs/Other Studies Reviewed:         Recent Labs: 02/23/2022: ALT 16; BUN 13; Creatinine 0.9; Hemoglobin 13.8; Platelets 255; Potassium 4.4; Sodium 140 07/15/2022: TSH 3.86  Recent  Lipid Panel    Component Value Date/Time   CHOL 193 09/25/2021 1147   TRIG 182 (H) 09/25/2021 1147   HDL 71 09/25/2021 1147   CHOLHDL 2.7 09/25/2021 1147   VLDL 22 07/26/2016 0914   LDLCALC 94 09/25/2021 1147    Physical Exam:    VS:  BP 98/74 (BP Location: Left Arm, Patient Position: Sitting)   Pulse 93   Ht 5\' 2"  (1.575 m)   Wt 150 lb (68 kg)   LMP  (LMP Unknown)   SpO2 98%   BMI 27.44 kg/m     Wt Readings from Last 3 Encounters:  11/18/22 150 lb (68 kg)  11/09/22 151 lb (68.5 kg)  11/04/22 151 lb 12.8 oz (68.9 kg)     GEN:  Well nourished, well developed in no acute distress HEENT: Normal NECK: No JVD; No carotid bruits LYMPHATICS: No lymphadenopathy CARDIAC: RRR, no murmurs, no rubs, no gallops RESPIRATORY:  Clear to auscultation without rales, wheezing or rhonchi  ABDOMEN: Soft, non-tender, non-distended MUSCULOSKELETAL:  No edema; No deformity  SKIN: Warm and dry LOWER EXTREMITIES: no swelling NEUROLOGIC:  Alert and oriented x 3 PSYCHIATRIC:  Normal affect   ASSESSMENT:    1. Abnormal stress test   2. Primary hypertension   3. Dyslipidemia    PLAN:    In order of problems listed above:  Abnormal stress test with ischemia involving LAD territory.  She does have symptoms that are very concerning we discussed option for this situation I think the best option would be to proceed directly to cardiac catheterization.  Procedure has been explained to her including all risk benefits as well as alternatives.  In the meantime I will stop her ranolazine but because it seems to not be helping, I will put her on Imdur 30 daily as well as nitroglycerin as needed.  She is already on antiplatelet therapy which I will continue.  I gave her instruction about taking nitroglycerin and told her to go to the emergency room or call 911 if pain does not go away with rest and nitroglycerin.  I did explain cardiac catheterization to her including all risk benefits as well as  alternatives.  She is ready to proceed. Essential  hypertension.  Blood pressure control actually blood per slightly low today.  Will continue monitoring. Dyslipidemia she is back on Crestor.   Medication Adjustments/Labs and Tests Ordered: Current medicines are reviewed at length with the patient today.  Concerns regarding medicines are outlined above.  No orders of the defined types were placed in this encounter.  Medication changes: No orders of the defined types were placed in this encounter.   Signed, Georgeanna Lea, MD, [facility] 11/18/2022 4:07 PM    [facility]

## 2022-11-19 LAB — BASIC METABOLIC PANEL
BUN/Creatinine Ratio: 15 (ref 9–23)
BUN: 16 mg/dL (ref 6–24)
CO2: 27 mmol/L (ref 20–29)
Calcium: 10 mg/dL (ref 8.7–10.2)
Chloride: 101 mmol/L (ref 96–106)
Creatinine, Ser: 1.09 mg/dL — ABNORMAL HIGH (ref 0.57–1.00)
Glucose: 80 mg/dL (ref 70–99)
Potassium: 4.8 mmol/L (ref 3.5–5.2)
Sodium: 141 mmol/L (ref 134–144)
eGFR: 59 mL/min/{1.73_m2} — ABNORMAL LOW (ref >59)

## 2022-11-19 LAB — CBC
Hematocrit: 40.1 % (ref 34.0–46.6)
Hemoglobin: 12.8 g/dL (ref 11.1–15.9)
MCH: 31.6 pg (ref 26.6–33.0)
MCHC: 31.9 g/dL (ref 31.5–35.7)
MCV: 99 fL — ABNORMAL HIGH (ref 79–97)
Platelets: 269 10*3/uL (ref 150–450)
RBC: 4.05 x10E6/uL (ref 3.77–5.28)
RDW: 12.7 % (ref 11.7–15.4)
WBC: 6.3 10*3/uL (ref 3.4–10.8)

## 2022-11-23 ENCOUNTER — Telehealth: Payer: Self-pay | Admitting: *Deleted

## 2022-11-23 NOTE — Telephone Encounter (Signed)
Cardiac Catheterization scheduled at [facility] for: Thursday November 25, 2022 9 AM Arrival time [facility] Main Entrance A at: 7 AM   Nothing to eat after midnight prior to procedure, clear liquids until 5 AM day of procedure.  Medication instructions: -Hold:  Losartan/Triamterene/HCT-day before and day of procedure-per protocol GFR < 60 (59) -Other usual morning medications can be taken with sips of water including aspirin 81 mg.  Wegovy-weekly on Fridays or Saturdays-pt did not take last week  Plan to go home the same day, you will only stay overnight if medically necessary.  You must have responsible adult to drive you home.  Someone must be with you the first 24 hours after you arrive home.  Reviewed procedure instructions with patient.

## 2022-11-25 ENCOUNTER — Other Ambulatory Visit: Payer: Self-pay

## 2022-11-25 ENCOUNTER — Encounter (HOSPITAL_COMMUNITY)
Admission: RE | Disposition: A | Discharge status: Home / Self Care | Payer: Self-pay | Source: Home / Self Care | Attending: Internal Medicine

## 2022-11-25 ENCOUNTER — Ambulatory Visit (HOSPITAL_COMMUNITY)
Admission: RE | Admit: 2022-11-25 | Discharge: 2022-11-25 | Disposition: A | Discharge status: Home / Self Care | Payer: 59 | Attending: Internal Medicine | Admitting: Internal Medicine

## 2022-11-25 DIAGNOSIS — I2584 Coronary atherosclerosis due to calcified coronary lesion: Secondary | ICD-10-CM | POA: Diagnosis not present

## 2022-11-25 DIAGNOSIS — G4733 Obstructive sleep apnea (adult) (pediatric): Secondary | ICD-10-CM | POA: Diagnosis not present

## 2022-11-25 DIAGNOSIS — I2510 Atherosclerotic heart disease of native coronary artery without angina pectoris: Secondary | ICD-10-CM | POA: Diagnosis not present

## 2022-11-25 DIAGNOSIS — I10 Essential (primary) hypertension: Secondary | ICD-10-CM | POA: Insufficient documentation

## 2022-11-25 DIAGNOSIS — Z7902 Long term (current) use of antithrombotics/antiplatelets: Secondary | ICD-10-CM | POA: Diagnosis not present

## 2022-11-25 DIAGNOSIS — I420 Dilated cardiomyopathy: Secondary | ICD-10-CM | POA: Diagnosis not present

## 2022-11-25 DIAGNOSIS — I471 Supraventricular tachycardia, unspecified: Secondary | ICD-10-CM | POA: Insufficient documentation

## 2022-11-25 DIAGNOSIS — R079 Chest pain, unspecified: Secondary | ICD-10-CM | POA: Insufficient documentation

## 2022-11-25 DIAGNOSIS — E669 Obesity, unspecified: Secondary | ICD-10-CM | POA: Diagnosis not present

## 2022-11-25 DIAGNOSIS — Z79899 Other long term (current) drug therapy: Secondary | ICD-10-CM | POA: Diagnosis not present

## 2022-11-25 DIAGNOSIS — R9439 Abnormal result of other cardiovascular function study: Secondary | ICD-10-CM | POA: Diagnosis present

## 2022-11-25 DIAGNOSIS — Z6827 Body mass index (BMI) 27.0-27.9, adult: Secondary | ICD-10-CM | POA: Insufficient documentation

## 2022-11-25 HISTORY — PX: LEFT HEART CATH AND CORONARY ANGIOGRAPHY: CATH118249

## 2022-11-25 SURGERY — LEFT HEART CATH AND CORONARY ANGIOGRAPHY
Anesthesia: LOCAL

## 2022-11-25 MED ORDER — SODIUM CHLORIDE 0.9% FLUSH: 3.0000 mL | Freq: Two times a day (BID) | INTRAVENOUS | Status: DC

## 2022-11-25 MED ORDER — SODIUM CHLORIDE 0.9 % WEIGHT BASED INFUSION: 1.0000 mL/kg/h | INTRAVENOUS | Status: DC

## 2022-11-25 MED ORDER — LABETALOL HCL 5 MG/ML IV SOLN: 10.0000 mg | INTRAVENOUS | Status: DC | PRN

## 2022-11-25 MED ORDER — HYDRALAZINE HCL 20 MG/ML IJ SOLN: 10.0000 mg | INTRAMUSCULAR | Status: DC | PRN

## 2022-11-25 MED ORDER — HEPARIN (PORCINE) IN NACL 1000-0.9 UT/500ML-% IV SOLN
INTRAVENOUS | Status: DC | PRN
  Administered 2022-11-25 (×2): 500 mL

## 2022-11-25 MED ORDER — FENTANYL CITRATE (PF) 100 MCG/2ML IJ SOLN
INTRAMUSCULAR | Status: AC
  Filled 2022-11-25: qty 2

## 2022-11-25 MED ORDER — ONDANSETRON HCL 4 MG/2ML IJ SOLN: 4.0000 mg | Freq: Four times a day (QID) | INTRAMUSCULAR | Status: DC | PRN

## 2022-11-25 MED ORDER — ASPIRIN 81 MG PO CHEW: 81.0000 mg | CHEWABLE_TABLET | ORAL | Status: DC

## 2022-11-25 MED ORDER — FENTANYL CITRATE (PF) 100 MCG/2ML IJ SOLN
INTRAMUSCULAR | Status: DC | PRN
  Administered 2022-11-25: 25 ug via INTRAVENOUS

## 2022-11-25 MED ORDER — SODIUM CHLORIDE 0.9% FLUSH: 3.0000 mL | INTRAVENOUS | Status: DC | PRN

## 2022-11-25 MED ORDER — LIDOCAINE HCL (PF) 1 % IJ SOLN
INTRAMUSCULAR | Status: DC | PRN
  Administered 2022-11-25: 2 mL

## 2022-11-25 MED ORDER — VERAPAMIL HCL 2.5 MG/ML IV SOLN
INTRAVENOUS | Status: AC
  Filled 2022-11-25: qty 2

## 2022-11-25 MED ORDER — SODIUM CHLORIDE 0.9 % WEIGHT BASED INFUSION
3.0000 mL/kg/h | INTRAVENOUS | Status: AC
  Administered 2022-11-25: 3 mL/kg/h via INTRAVENOUS

## 2022-11-25 MED ORDER — LIDOCAINE HCL (PF) 1 % IJ SOLN
INTRAMUSCULAR | Status: AC
  Filled 2022-11-25: qty 30

## 2022-11-25 MED ORDER — SODIUM CHLORIDE 0.9 % IV SOLN: 250.0000 mL | INTRAVENOUS | Status: DC | PRN

## 2022-11-25 MED ORDER — HEPARIN SODIUM (PORCINE) 1000 UNIT/ML IJ SOLN
INTRAMUSCULAR | Status: DC | PRN
  Administered 2022-11-25: 3500 [IU] via INTRAVENOUS

## 2022-11-25 MED ORDER — HEPARIN SODIUM (PORCINE) 1000 UNIT/ML IJ SOLN
INTRAMUSCULAR | Status: AC
  Filled 2022-11-25: qty 10

## 2022-11-25 MED ORDER — MIDAZOLAM HCL 2 MG/2ML IJ SOLN
INTRAMUSCULAR | Status: AC
  Filled 2022-11-25: qty 2

## 2022-11-25 MED ORDER — MIDAZOLAM HCL 2 MG/2ML IJ SOLN
INTRAMUSCULAR | Status: DC | PRN
  Administered 2022-11-25: 1 mg via INTRAVENOUS

## 2022-11-25 MED ORDER — VERAPAMIL HCL 2.5 MG/ML IV SOLN
INTRAVENOUS | Status: DC | PRN
  Administered 2022-11-25: 10 mL via INTRA_ARTERIAL

## 2022-11-25 MED ORDER — SODIUM CHLORIDE 0.9 % IV SOLN: INTRAVENOUS | Status: DC

## 2022-11-25 MED ORDER — ACETAMINOPHEN 325 MG PO TABS: 650.0000 mg | ORAL_TABLET | ORAL | Status: DC | PRN

## 2022-11-25 MED ORDER — IOHEXOL 350 MG/ML SOLN
INTRAVENOUS | Status: DC | PRN
  Administered 2022-11-25: 37 mL

## 2022-11-25 SURGICAL SUPPLY — 8 items
CATH INFINITI 5 FR JL3.5 (CATHETERS) IMPLANT
CATH INFINITI 5FR MULTPACK ANG (CATHETERS) IMPLANT
DEVICE RAD TR BAND REGULAR (VASCULAR PRODUCTS) IMPLANT
GLIDESHEATH SLEND SS 6F .021 (SHEATH) IMPLANT
GUIDEWIRE INQWIRE 1.5J.035X260 (WIRE) IMPLANT
INQWIRE 1.5J .035X260CM (WIRE) ×1
PACK CARDIAC CATHETERIZATION (CUSTOM PROCEDURE TRAY) ×1 IMPLANT
SET ATX-X65L (MISCELLANEOUS) IMPLANT

## 2022-11-25 NOTE — Interval H&P Note (Signed)
History and Physical Interval Note:  11/25/2022 8:34 AM  Rebecca Mccoy  has presented today for surgery, with the diagnosis of chest pain and abnormal stress test.  The various methods of treatment have been discussed with the patient and family. After consideration of risks, benefits and other options for treatment, the patient has consented to  Procedure(s): LEFT HEART CATH AND CORONARY ANGIOGRAPHY (N/A) as a surgical intervention.  The patient's history has been reviewed, patient examined, no change in status, stable for surgery.  I have reviewed the patient's chart and labs.  Questions were answered to the patient's satisfaction.    Cath Lab Visit (complete for each Cath Lab visit)  Clinical Evaluation Leading to the Procedure:   ACS: No.  Non-ACS:    Anginal Classification: CCS III  Anti-ischemic medical therapy: Maximal Therapy (2 or more classes of medications)  Non-Invasive Test Results: Intermediate-risk stress test findings: cardiac mortality 1-3%/year  Prior CABG: No previous CABG  Josi Roediger

## 2022-11-26 ENCOUNTER — Encounter (HOSPITAL_COMMUNITY): Payer: Self-pay | Admitting: Internal Medicine

## 2022-12-03 ENCOUNTER — Encounter: Payer: Self-pay | Admitting: Cardiology

## 2022-12-03 ENCOUNTER — Ambulatory Visit: Payer: 59 | Attending: Cardiology | Admitting: Cardiology

## 2022-12-03 VITALS — BP 104/66 | HR 94 | Ht 62.0 in | Wt 148.6 lb

## 2022-12-03 DIAGNOSIS — E785 Hyperlipidemia, unspecified: Secondary | ICD-10-CM | POA: Diagnosis not present

## 2022-12-03 DIAGNOSIS — I25118 Atherosclerotic heart disease of native coronary artery with other forms of angina pectoris: Secondary | ICD-10-CM | POA: Diagnosis not present

## 2022-12-03 DIAGNOSIS — I2510 Atherosclerotic heart disease of native coronary artery without angina pectoris: Secondary | ICD-10-CM | POA: Insufficient documentation

## 2022-12-03 DIAGNOSIS — I10 Essential (primary) hypertension: Secondary | ICD-10-CM | POA: Diagnosis not present

## 2022-12-03 DIAGNOSIS — R9439 Abnormal result of other cardiovascular function study: Secondary | ICD-10-CM | POA: Diagnosis not present

## 2022-12-03 HISTORY — DX: Atherosclerotic heart disease of native coronary artery without angina pectoris: I25.10

## 2022-12-03 NOTE — Patient Instructions (Signed)
Medication Instructions:  Your physician recommends that you continue on your current medications as directed. Please refer to the Current Medication list given to you today.  *If you need a refill on your cardiac medications before your next appointment, please call your pharmacy*   Lab Work: None Ordered If you have labs (blood work) drawn today and your tests are completely normal, you will receive your results only by: MyChart Message (if you have MyChart) OR A paper copy in the mail If you have any lab test that is abnormal or we need to change your treatment, we will call you to review the results.   Testing/Procedures: None Ordered   Follow-Up: At [facility], you and your health needs are our priority.  As part of our continuing mission to provide you with exceptional heart care, we have created designated Provider Care Teams.  These Care Teams include your primary Cardiologist (physician) and Advanced Practice Providers (APPs -  Physician Assistants and Nurse Practitioners) who all work together to provide you with the care you need, when you need it.  We recommend signing up for the patient portal called "MyChart".  Sign up information is provided on this After Visit Summary.  MyChart is used to connect with patients for Virtual Visits (Telemedicine).  Patients are able to view lab/test results, encounter notes, upcoming appointments, etc.  Non-urgent messages can be sent to your provider as well.   To learn more about what you can do with MyChart, go to [email or web address].    Your next appointment:   4 month(s)  The format for your next appointment:   In Person  Provider:   Robert Krasowski, MD    Other Instructions NA  

## 2022-12-03 NOTE — Progress Notes (Signed)
Cardiology Office Note:    Date:  12/03/2022   ID:  [name], DOB 02-05-1965, MRN [id number]  PCP:  Margaree Mackintosh, MD  Cardiologist:  Gypsy Balsam, MD    Referring MD: Margaree Mackintosh, MD   Chief Complaint  Patient presents with   Follow-up    History of Present Illness:    Rebecca Mccoy is a 57 y.o. female past medical history significant for nonischemic cardiomyopathy initially detected in January 2023 with ejection fraction 45%, at that time course.  Show nonobstructive disease.  Then she still having some suspicion chest pain cardiac catheterization has been done after stress test showed ischemia in LAD territory.  Likely cardiac catheterization confirmed presence of nonobstructive disease.  She is very happy with results.  Still however complain of having some tightness in the chest when she pushed herself heart.  No swelling of lower extremities no shortness of breath  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 08/14/2019   Daytime somnolence 05/27/2022   Dilated cardiomyopathy (HCC) 02/04/2021   Dyslipidemia 07/03/2018   Fluid retention    GERD (gastroesophageal reflux disease)    GERD (gastroesophageal reflux disease)    Heart murmur    infant   High cholesterol    History of obesity 05/27/2022   Hx of adenomatous polyp of colon 07/09/2016   Sessile serrated polyp 11/17   Hypertension    Hypothyroidism 12/03/2015   Insomnia 07/30/2016   Iron deficiency anemia 11/14/2020   Irregular heart beat    Migraines    Non-allergic rhinitis 06/10/2017   OSA (obstructive sleep apnea) 10/04/2017   PID (pelvic inflammatory disease)    Polyphagia 05/27/2022   Prediabetes 10/03/2020   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   Recurrent upper respiratory infection (URI)    April 2019   Sleep apnea    Supraventricular tachycardia  (HCC) 08/22/2018   Thyroid disease    Urticaria     Past Surgical History:  Procedure Laterality Date   AUGMENTATION MAMMAPLASTY Bilateral 2005   BREAST SURGERY  2005   Augmentation   CARPAL TUNNEL RELEASE Bilateral    R in '07 and L '06   COLONOSCOPY  2017   COSMETIC SURGERY     LEFT HEART CATH AND CORONARY ANGIOGRAPHY N/A 11/25/2022   Procedure: LEFT HEART CATH AND CORONARY ANGIOGRAPHY;  Surgeon: Yvonne Kendall, MD;  Location: [facility] INVASIVE [facility];  Service: Cardiovascular;  Laterality: N/A;   leg lift     TONSILLECTOMY  1972   TUBAL LIGATION  1989   tummy tuck  2005    Current Medications: Current Meds  Medication Sig   acetaminophen (TYLENOL) 325 MG tablet Take 500 mg by mouth every 6 (six) hours as needed for mild pain (pain score 1-3) or moderate pain (pain score 4-6).   albuterol (VENTOLIN HFA) 108 (90 Base) MCG/ACT inhaler Inhale 1-2 puffs into the lungs every 6 (six) hours as needed for wheezing or shortness of breath.   ALPRAZolam (XANAX) 1 MG tablet Take 1 tablet by mouth twice daily as needed for anxiety (Patient taking differently: Take 1 mg by mouth at bedtime.)   aspirin EC 81 MG tablet Take 1 tablet (81 mg total) by mouth daily. Swallow whole.   budesonide-formoterol (SYMBICORT) 160-4.5 MCG/ACT inhaler Inhale 2 puffs into the lungs 2 (two) times daily. (Patient  taking differently: Inhale 2 puffs into the lungs 2 (two) times daily as needed (shortness of breath).)   glucosamine-chondroitin 500-400 MG tablet Take 1 tablet by mouth daily.   isosorbide mononitrate (IMDUR) 30 MG 24 hr tablet Take 1 tablet (30 mg total) by mouth daily.   levothyroxine (SYNTHROID) 88 MCG tablet Take 1 tablet by mouth once daily   losartan (COZAAR) 25 MG tablet Take 1 tablet (25 mg total) by mouth 2 (two) times daily.   MELATONIN PO Take 7.5 mg by mouth at bedtime.   metoprolol succinate (TOPROL-XL) 50 MG 24 hr tablet Take 0.5 tablets (25 mg total) by mouth daily. Take with or immediately  following a meal.   montelukast (SINGULAIR) 4 MG PACK Take 4 mg by mouth at bedtime.   Multiple Vitamin (MULTIVITAMIN) capsule Take 1 capsule by mouth daily. Unknown strength   nitroGLYCERIN (NITROSTAT) 0.4 MG SL tablet Place 1 tablet (0.4 mg total) under the tongue every 5 (five) minutes as needed for chest pain.   Omega-3 Fatty Acids (FISH OIL) 1000 MG CAPS Take 2 capsules by mouth daily.   omeprazole (PRILOSEC) 20 MG capsule Take 20 mg by mouth daily.   rosuvastatin (CRESTOR) 5 MG tablet TAKE 1 TABLET BY MOUTH THREE TIMES A WEEK (Patient taking differently: Take 5 mg by mouth every Monday, Wednesday, and Friday.)   Semaglutide-Weight Management (WEGOVY) 2.4 MG/0.75ML SOAJ Inject 2.4 mg into the skin once a week.   triamterene-hydrochlorothiazide (MAXZIDE-25) 37.5-25 MG tablet Take 0.5 tablets by mouth every other day.   TURMERIC PO Take 1,000 mg by mouth daily. Unknown strength     Allergies:   Patient has no known allergies.   Social History   Socioeconomic History   Marital status: Married    Spouse name: Not on file   Number of children: 2   Years of education: Not on file   Highest education level: Associate degree: academic program  Occupational History   Occupation: Designer, industrial/product  Tobacco Use   Smoking status: Never   Smokeless tobacco: Never  Vaping Use   Vaping status: Never Used  Substance and Sexual Activity   Alcohol use: Yes    Alcohol/week: 0.0 - 2.0 standard drinks of alcohol    Comment: 1-2 beer weekly   Drug use: No   Sexual activity: Yes    Partners: Male    Birth control/protection: Surgical, Post-menopausal    Comment: INTERCOUSE AGE 50, SEXUAL PARTNERS MORE THAN 5  Other Topics Concern   Not on file  Social History Narrative   Not on file   Social Determinants of Health   Financial Resource Strain: Low Risk  (06/02/2022)   Overall Financial Resource Strain (CARDIA)    Difficulty of Paying Living Expenses: Not hard at all  Food  Insecurity: No Food Insecurity (06/02/2022)   Hunger Vital Sign    Worried About Running Out of Food in the Last Year: Never true    Ran Out of Food in the Last Year: Never true  Transportation Needs: No Transportation Needs (06/02/2022)   PRAPARE - Administrator, Civil Service (Medical): No    Lack of Transportation (Non-Medical): No  Physical Activity: Insufficiently Active (06/02/2022)   Exercise Vital Sign    Days of Exercise per Week: 3 days    Minutes of Exercise per Session: 30 min  Stress: No Stress Concern Present (06/02/2022)   [employer] of Occupational Health - Occupational Stress Questionnaire    Feeling of Stress :  Only a little  Social Connections: Socially Integrated (06/02/2022)   Social Connection and Isolation Panel [NHANES]    Frequency of Communication with Friends and Family: Twice a week    Frequency of Social Gatherings with Friends and Family: Once a week    Attends Religious Services: More than 4 times per year    Active Member of [employer] or Organizations: Yes    Attends Engineer, structural: More than 4 times per year    Marital Status: Married     Family History: The patient's family history includes Allergic rhinitis in her mother; Colon polyps in her mother; Crohn's disease in her cousin; Diabetes in her maternal grandfather, maternal grandmother, and mother; Hyperlipidemia in her father; Hypertension in her father and mother; Obesity in her mother; Pancreatic cancer in her father; Stroke in her maternal grandmother and paternal grandmother; Thyroid disease in her mother; Uterine cancer in her mother. There is no history of Colon cancer, Stomach cancer, Esophageal cancer, Rectal cancer, Liver cancer, Eczema, Urticaria, or Asthma. ROS:   Please see the history of present illness.    All 14 point review of systems negative except as described per history of present illness  EKGs/Labs/Other Studies Reviewed:         Recent  Labs: 02/23/2022: ALT 16 07/15/2022: TSH 3.86 11/18/2022: BUN 16; Creatinine, Ser 1.09; Hemoglobin 12.8; Platelets 269; Potassium 4.8; Sodium 141  Recent Lipid Panel    Component Value Date/Time   CHOL 193 09/25/2021 1147   TRIG 182 (H) 09/25/2021 1147   HDL 71 09/25/2021 1147   CHOLHDL 2.7 09/25/2021 1147   VLDL 22 07/26/2016 0914   LDLCALC 94 09/25/2021 1147    Physical Exam:    VS:  BP 104/66 (BP Location: Left Arm, Patient Position: Sitting)   Pulse 94   Ht 5\' 2"  (1.575 m)   Wt 148 lb 9.6 oz (67.4 kg)   LMP  (LMP Unknown)   SpO2 97%   BMI 27.18 kg/m     Wt Readings from Last 3 Encounters:  12/03/22 148 lb 9.6 oz (67.4 kg)  11/25/22 145 lb (65.8 kg)  11/18/22 150 lb (68 kg)     GEN:  Well nourished, well developed in no acute distress HEENT: Normal NECK: No JVD; No carotid bruits LYMPHATICS: No lymphadenopathy CARDIAC: RRR, no murmurs, no rubs, no gallops RESPIRATORY:  Clear to auscultation without rales, wheezing or rhonchi  ABDOMEN: Soft, non-tender, non-distended MUSCULOSKELETAL:  No edema; No deformity  SKIN: Warm and dry LOWER EXTREMITIES: no swelling NEUROLOGIC:  Alert and oriented x 3 PSYCHIATRIC:  Normal affect   ASSESSMENT:    1. Coronary artery disease of native artery of native heart with stable angina pectoris (HCC)   2. Primary hypertension   3. Abnormal stress test   4. Dyslipidemia    PLAN:    In order of problems listed above:  Coronary disease only luminal based on coronary angiography.  Still it is reasonable To continue with antiplatelet therapy, she is taking Toprol-XL which I will continue, also Imdur to 60 mg but has suffered from headache almost every day so ask her to reduce to 30, she had no problem with 30 mg.  She also got ranolazine ask her to try it for about a month to see if it helps any. History of cardiomyopathy: Last echocardiogram done in September showed normal left ventricle ejection fraction.  Will continue present  management. Dyslipidemia.  I did review K PN from October LDL at 140  HDL 74 she started Crestor only 3 times a week 5 mg.  She will have fasting lipid profile done in about a month   Medication Adjustments/Labs and Tests Ordered: Current medicines are reviewed at length with the patient today.  Concerns regarding medicines are outlined above.  No orders of the defined types were placed in this encounter.  Medication changes: No orders of the defined types were placed in this encounter.   Signed, Georgeanna Lea, MD, [facility] 12/03/2022 3:40 PM     [facility]

## 2022-12-21 ENCOUNTER — Telehealth (INDEPENDENT_AMBULATORY_CARE_PROVIDER_SITE_OTHER): Payer: 59 | Admitting: Internal Medicine

## 2022-12-21 ENCOUNTER — Other Ambulatory Visit (HOSPITAL_COMMUNITY): Payer: Self-pay

## 2022-12-21 ENCOUNTER — Encounter: Payer: Self-pay | Admitting: Internal Medicine

## 2022-12-21 VITALS — BP 117/72 | HR 88 | Temp 97.6°F | Ht 62.0 in | Wt 148.0 lb

## 2022-12-21 DIAGNOSIS — Z7184 Encounter for health counseling related to travel: Secondary | ICD-10-CM | POA: Diagnosis not present

## 2022-12-21 DIAGNOSIS — U071 COVID-19: Secondary | ICD-10-CM

## 2022-12-21 MED ORDER — AZITHROMYCIN 250 MG PO TABS: ORAL_TABLET | ORAL | 0 refills | Status: AC

## 2022-12-21 MED ORDER — WEGOVY 2.4 MG/0.75ML ~~LOC~~ SOAJ
2.4000 mg | SUBCUTANEOUS | 0 refills | Status: DC
  Filled 2022-12-21 – 2023-01-04 (×3): qty 3, 28d supply, fill #0
  Filled 2023-02-06: qty 3, 28d supply, fill #1
  Filled 2023-02-28: qty 3, 28d supply, fill #2

## 2022-12-21 MED ORDER — BENZONATATE 100 MG PO CAPS: 100.0000 mg | ORAL_CAPSULE | Freq: Three times a day (TID) | ORAL | 0 refills | Status: DC | PRN

## 2022-12-21 MED ORDER — LEVOFLOXACIN 500 MG PO TABS
500.0000 mg | ORAL_TABLET | Freq: Every day | ORAL | 0 refills | Status: AC
Start: 2022-12-21 — End: 2022-12-28

## 2022-12-21 MED ORDER — FLUCONAZOLE 150 MG PO TABS: 150.0000 mg | ORAL_TABLET | Freq: Once | ORAL | 1 refills | Status: AC

## 2022-12-21 MED ORDER — METHYLPREDNISOLONE 4 MG PO TABS: ORAL_TABLET | ORAL | 0 refills | Status: DC

## 2022-12-21 MED ORDER — ONDANSETRON HCL 4 MG PO TABS: 4.0000 mg | ORAL_TABLET | Freq: Three times a day (TID) | ORAL | 0 refills | Status: DC | PRN

## 2022-12-21 NOTE — Progress Notes (Signed)
Patient Care Team: Margaree Mackintosh, MD as PCP - General (Internal Medicine)  I connected with Minette Headland on 12/21/22 at 2:57 PM by video enabled telemedicine visit and verified that I am speaking with the correct person using two identifiers.   I discussed the limitations, risks, security and privacy concerns of performing an evaluation and management service by telemedicine and the availability of in-person appointments. I also discussed with the patient that there may be a patient responsible charge related to this service. The patient expressed understanding and agreed to proceed.   Other persons participating in the visit and their role in the encounter: Medical scribe, Doylene Bode  Patient's location: Home  Provider's location: Clinic   I provided 15 minutes of face-to-face video visit time during this encounter, and > 50% was spent counseling as documented under my assessment & plan. She is identified using two identifiers, Raylinn Trice, a patient of this practice. She is in her house and I am in my practice. She is agreeable to using this format today.  Chief Complaint: cough, congestion   Subjective:    Patient ID: Rebecca Mccoy , Female    DOB: February 09, 1965, 57 y.o.    MRN: [id number]   57 y.o. Female presents today for cough, congestion, post-nasal drip for several days. Cough is worse at night. Had low-grade fever on 12/17/22 that resolved quickly. Reports one negative and one positive Covid-19 test on 12/17/22. She is leaving for a mission trip on 12/23/22. She is hydrating well.  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 08/14/2019   Daytime somnolence 05/27/2022   Dilated cardiomyopathy (HCC) 02/04/2021   Dyslipidemia 07/03/2018   Fluid retention    GERD (gastroesophageal reflux disease)    GERD (gastroesophageal reflux disease)    Heart murmur     infant   High cholesterol    History of obesity 05/27/2022   Hx of adenomatous polyp of colon 07/09/2016   Sessile serrated polyp 11/17   Hypertension    Hypothyroidism 12/03/2015   Insomnia 07/30/2016   Iron deficiency anemia 11/14/2020   Irregular heart beat    Migraines    Non-allergic rhinitis 06/10/2017   OSA (obstructive sleep apnea) 10/04/2017   PID (pelvic inflammatory disease)    Polyphagia 05/27/2022   Prediabetes 10/03/2020   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   Recurrent upper respiratory infection (URI)    April 2019   Sleep apnea    Supraventricular tachycardia (HCC) 08/22/2018   Thyroid disease    Urticaria      Family History  Problem Relation Age of Onset   Diabetes Mother    Uterine cancer Mother    Colon polyps Mother    Allergic rhinitis Mother    Hypertension Mother    Thyroid disease Mother    Obesity Mother    Pancreatic cancer Father    Hyperlipidemia Father    Hypertension Father    Crohn's disease Cousin    Diabetes Maternal Grandmother    Stroke Maternal Grandmother    Diabetes Maternal Grandfather    Stroke Paternal Grandmother    Colon cancer Neg Hx    Stomach cancer Neg Hx    Esophageal cancer Neg Hx    Rectal cancer Neg Hx    Liver cancer Neg Hx    Eczema Neg Hx  Urticaria Neg Hx    Asthma Neg Hx     Social Hx: Married. 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 She has an adult daughter. Social alcohol consumption.     Review of Systems  Constitutional:  Negative for fever and malaise/fatigue.  HENT:  Positive for congestion.        (+) Post-nasal drip  Eyes:  Negative for blurred vision.  Respiratory:  Positive for cough. Negative for shortness of breath.   Cardiovascular:  Negative for chest pain, palpitations and leg swelling.  Gastrointestinal:  Negative for vomiting.  Musculoskeletal:  Negative for back pain.  Skin:  Negative for rash.  Neurological:  Negative for loss of  consciousness and headaches.        Objective:   Vitals: BP 117/72   Pulse 88   Temp 97.6 F (36.4 C)   Ht 5\' 2"  (1.575 m)   Wt 148 lb (67.1 kg)   LMP  (LMP Unknown)   SpO2 98%   BMI 27.07 kg/m    Physical Exam Vitals and nursing note reviewed.  Constitutional:      General: She is not in acute distress.    Appearance: Normal appearance. She is not toxic-appearing.  HENT:     Head: Normocephalic and atraumatic.  Pulmonary:     Effort: Pulmonary effort is normal.  Skin:    General: Skin is warm and dry.  Neurological:     Mental Status: She is alert and oriented to person, place, and time. Mental status is at baseline.  Psychiatric:        Mood and Affect: Mood normal.        Behavior: Behavior normal.        Thought Content: Thought content normal.        Judgment: Judgment normal.       Results:   Studies obtained and personally reviewed by me:   Labs:       Component Value Date/Time   NA 141 11/18/2022 1625   K 4.8 11/18/2022 1625   CL 101 11/18/2022 1625   CO2 27 11/18/2022 1625   GLUCOSE 80 11/18/2022 1625   GLUCOSE 89 09/25/2021 1147   BUN 16 11/18/2022 1625   CREATININE 1.09 (H) 11/18/2022 1625   CREATININE 1.01 09/25/2021 1147   CALCIUM 10.0 11/18/2022 1625   PROT 7.0 09/25/2021 1147   PROT 7.5 07/01/2020 1741   ALBUMIN 4.9 02/23/2022 0000   ALBUMIN 4.9 07/01/2020 1741   AST 21 02/23/2022 0000   ALT 16 02/23/2022 0000   ALKPHOS 60 02/23/2022 0000   BILITOT 0.8 09/25/2021 1147   BILITOT 1.0 07/01/2020 1741   GFRNONAA 64 06/22/2019 0915   GFRAA 74 06/22/2019 0915     Lab Results  Component Value Date   WBC 6.3 11/18/2022   HGB 12.8 11/18/2022   HCT 40.1 11/18/2022   MCV 99 (H) 11/18/2022   PLT 269 11/18/2022    Lab Results  Component Value Date   CHOL 193 09/25/2021   HDL 71 09/25/2021   LDLCALC 94 09/25/2021   TRIG 182 (H) 09/25/2021   CHOLHDL 2.7 09/25/2021    Lab Results  Component Value Date   HGBA1C 5.4 02/23/2022      Lab Results  Component Value Date   TSH 3.86 07/15/2022      Assessment & Plan:   Acute Covid-19 infection: prescribed Z-Pak two tabs day 1 followed by one tab days 2-5, Tessalon 100 mg three times daily as needed  for cough, Diflucan 150 mg once if needed for Candida vaginitis. Get plenty of rest and stay well-hydrated.    Travel advice encounter: Given levofloxacin 500 mg daily for 7 days if needed for acute lower respiratory infection or acute urinary infection, Medrol 4 mg 6-day taper, Zofran 4 mg tablets every 8 hours as needed for nausea to have on-hand for upcoming travel.    I,Alexander Ruley,acting as a Neurosurgeon for Margaree Mackintosh, MD.,have documented all relevant documentation on the behalf of Margaree Mackintosh, MD,as directed by  Margaree Mackintosh, MD while in the presence of Margaree Mackintosh, MD.   I, Margaree Mackintosh, MD, have reviewed all documentation for this visit. The documentation on 12/24/22 for the exam, diagnosis, procedures, and orders are all accurate and complete.

## 2022-12-23 ENCOUNTER — Ambulatory Visit: Payer: 59 | Admitting: Cardiology

## 2022-12-24 ENCOUNTER — Encounter: Payer: Self-pay | Admitting: Internal Medicine

## 2022-12-24 NOTE — Patient Instructions (Signed)
Patient will take Zithromax Z-PAK 2 tabs day 1 followed by 1 tab days 2 through 5.  She will also take Tessalon Perles 100 mg 3 times daily as needed for cough.  May take Diflucan 150 mg tablet if needed for Candida vaginitis.  Rest and stay well-hydrated.  Regarding upcoming travel she was given Levaquin 500 mg daily for 7 days should she come down with lower respiratory infection or urinary tract infection.  Was given Medrol Dosepak 4 mg 6-day course to take as directed if needed for allergic reaction or poison ivy.  May take Zofran 4 mg if needed for nausea.

## 2022-12-31 ENCOUNTER — Other Ambulatory Visit: Payer: Self-pay | Admitting: Cardiology

## 2023-01-03 ENCOUNTER — Other Ambulatory Visit: Payer: Self-pay

## 2023-01-03 ENCOUNTER — Other Ambulatory Visit (HOSPITAL_COMMUNITY): Payer: Self-pay

## 2023-01-04 ENCOUNTER — Other Ambulatory Visit (HOSPITAL_COMMUNITY): Payer: Self-pay

## 2023-01-22 ENCOUNTER — Other Ambulatory Visit: Payer: Self-pay | Admitting: Internal Medicine

## 2023-01-24 ENCOUNTER — Ambulatory Visit: Payer: 59 | Admitting: Cardiology

## 2023-01-25 NOTE — Telephone Encounter (Signed)
 Schedule CPE end of this month

## 2023-02-08 ENCOUNTER — Other Ambulatory Visit: Payer: 59

## 2023-02-08 DIAGNOSIS — Z6832 Body mass index (BMI) 32.0-32.9, adult: Secondary | ICD-10-CM

## 2023-02-08 DIAGNOSIS — I10 Essential (primary) hypertension: Secondary | ICD-10-CM

## 2023-02-08 DIAGNOSIS — E039 Hypothyroidism, unspecified: Secondary | ICD-10-CM

## 2023-02-08 DIAGNOSIS — R7302 Impaired glucose tolerance (oral): Secondary | ICD-10-CM

## 2023-02-08 DIAGNOSIS — E782 Mixed hyperlipidemia: Secondary | ICD-10-CM

## 2023-02-08 DIAGNOSIS — Z0000 Encounter for general adult medical examination without abnormal findings: Secondary | ICD-10-CM

## 2023-02-08 DIAGNOSIS — R7301 Impaired fasting glucose: Secondary | ICD-10-CM

## 2023-02-09 LAB — CBC WITH DIFFERENTIAL/PLATELET
Absolute Lymphocytes: 1902 {cells}/uL (ref 850–3900)
Absolute Monocytes: 332 {cells}/uL (ref 200–950)
Basophils Absolute: 29 {cells}/uL (ref 0–200)
Basophils Relative: 0.7 %
Eosinophils Absolute: 70 {cells}/uL (ref 15–500)
Eosinophils Relative: 1.7 %
HCT: 39.4 % (ref 35.0–45.0)
Hemoglobin: 13 g/dL (ref 11.7–15.5)
MCH: 32.7 pg (ref 27.0–33.0)
MCHC: 33 g/dL (ref 32.0–36.0)
MCV: 99.2 fL (ref 80.0–100.0)
MPV: 10 fL (ref 7.5–12.5)
Monocytes Relative: 8.1 %
Neutro Abs: 1767 {cells}/uL (ref 1500–7800)
Neutrophils Relative %: 43.1 %
Platelets: 269 10*3/uL (ref 140–400)
RBC: 3.97 10*6/uL (ref 3.80–5.10)
RDW: 12.7 % (ref 11.0–15.0)
Total Lymphocyte: 46.4 %
WBC: 4.1 10*3/uL (ref 3.8–10.8)

## 2023-02-09 LAB — LIPID PANEL
Cholesterol: 200 mg/dL — ABNORMAL HIGH (ref <200)
HDL: 92 mg/dL (ref >=50)
LDL Cholesterol (Calc): 89 mg/dL
Non-HDL Cholesterol (Calc): 108 mg/dL (ref <130)
Total CHOL/HDL Ratio: 2.2 (calc) (ref <5.0)
Triglycerides: 93 mg/dL (ref <150)

## 2023-02-09 LAB — COMPLETE METABOLIC PANEL WITH GFR
AG Ratio: 2 (calc) (ref 1.0–2.5)
ALT: 15 U/L (ref 6–29)
AST: 26 U/L (ref 10–35)
Albumin: 4.8 g/dL (ref 3.6–5.1)
Alkaline phosphatase (APISO): 49 U/L (ref 37–153)
BUN/Creatinine Ratio: 16 (calc) (ref 6–22)
BUN: 17 mg/dL (ref 7–25)
CO2: 32 mmol/L (ref 20–32)
Calcium: 10.1 mg/dL (ref 8.6–10.4)
Chloride: 102 mmol/L (ref 98–110)
Creat: 1.07 mg/dL — ABNORMAL HIGH (ref 0.50–1.03)
Globulin: 2.4 g/dL (ref 1.9–3.7)
Glucose, Bld: 83 mg/dL (ref 65–99)
Potassium: 5 mmol/L (ref 3.5–5.3)
Sodium: 142 mmol/L (ref 135–146)
Total Bilirubin: 0.9 mg/dL (ref 0.2–1.2)
Total Protein: 7.2 g/dL (ref 6.1–8.1)
eGFR: 61 mL/min/{1.73_m2} (ref >=60)

## 2023-02-09 LAB — HEMOGLOBIN A1C
Hgb A1c MFr Bld: 5 %{Hb} (ref <5.7)
Mean Plasma Glucose: 97 mg/dL
eAG (mmol/L): 5.4 mmol/L

## 2023-02-09 LAB — TSH: TSH: 5.24 m[IU]/L — ABNORMAL HIGH (ref 0.40–4.50)

## 2023-02-12 ENCOUNTER — Other Ambulatory Visit: Payer: Self-pay | Admitting: Internal Medicine

## 2023-02-14 ENCOUNTER — Other Ambulatory Visit (HOSPITAL_COMMUNITY)
Admission: RE | Admit: 2023-02-14 | Discharge: 2023-02-14 | Disposition: A | Discharge status: Home / Self Care | Payer: 59 | Source: Ambulatory Visit | Attending: Internal Medicine | Admitting: Internal Medicine

## 2023-02-14 ENCOUNTER — Encounter: Payer: Self-pay | Admitting: Internal Medicine

## 2023-02-14 ENCOUNTER — Ambulatory Visit: Payer: 59 | Admitting: Internal Medicine

## 2023-02-14 VITALS — BP 120/80 | HR 98 | Ht 62.0 in | Wt 150.0 lb

## 2023-02-14 DIAGNOSIS — I10 Essential (primary) hypertension: Secondary | ICD-10-CM

## 2023-02-14 DIAGNOSIS — Z780 Asymptomatic menopausal state: Secondary | ICD-10-CM

## 2023-02-14 DIAGNOSIS — R7989 Other specified abnormal findings of blood chemistry: Secondary | ICD-10-CM

## 2023-02-14 DIAGNOSIS — E039 Hypothyroidism, unspecified: Secondary | ICD-10-CM

## 2023-02-14 DIAGNOSIS — G4733 Obstructive sleep apnea (adult) (pediatric): Secondary | ICD-10-CM

## 2023-02-14 DIAGNOSIS — I2510 Atherosclerotic heart disease of native coronary artery without angina pectoris: Secondary | ICD-10-CM

## 2023-02-14 DIAGNOSIS — Z0000 Encounter for general adult medical examination without abnormal findings: Secondary | ICD-10-CM

## 2023-02-14 DIAGNOSIS — Z124 Encounter for screening for malignant neoplasm of cervix: Secondary | ICD-10-CM | POA: Diagnosis not present

## 2023-02-14 DIAGNOSIS — E782 Mixed hyperlipidemia: Secondary | ICD-10-CM

## 2023-02-14 LAB — POCT URINALYSIS DIP (CLINITEK)
Bilirubin, UA: NEGATIVE
Blood, UA: NEGATIVE
Glucose, UA: NEGATIVE mg/dL
Ketones, POC UA: NEGATIVE mg/dL
Leukocytes, UA: NEGATIVE
Nitrite, UA: NEGATIVE
POC PROTEIN,UA: NEGATIVE
Spec Grav, UA: 1.01 (ref 1.010–1.025)
Urobilinogen, UA: 0.2 U/dL
pH, UA: 6.5 (ref 5.0–8.0)

## 2023-02-14 MED ORDER — LEVOTHYROXINE SODIUM 100 MCG PO TABS: 100.0000 ug | ORAL_TABLET | Freq: Every day | ORAL | 1 refills | Status: DC

## 2023-02-14 NOTE — Progress Notes (Signed)
Annual Wellness Visit   Patient Care Team: Margaree Mackintosh, MD as PCP - General (Internal Medicine)  Visit Date: 02/14/23   Chief Complaint  Patient presents with   Annual Exam   Subjective:  Patient: Rebecca Mccoy, Female DOB: [birth year], 58 y.o. MRN: [id number]  Rebecca Mccoy is a 58 y.o. Female who presents today for her Annual Wellness Visit.   Brings attention to a minor mass on her upper arm for evaluation.   History of Hypertension treated with 25 mg Losartan BID, 25 mg Metoprolol succinate daily, Imdur 30 mg daily, and 0.4 mg Nitrostat as needed. Blood Pressure: normotensive at 120/80. Being followed by Gypsy Balsam with cardiology, and reports that halting her cardiologist may consider halting her Metoprolol soon.  History of Hyperlipidemia treated with 5 mg Rosuvastatin 3x/week - can not tolerate daily dosing. 02/08/23 Lipid Panel, compared to 09/25/21: Cholesterol 200, elevated from 193; otherwise WNL. Does note that she does not eat frequently consume fried foods or red meats.   History of Hypothyroidism treated with 88 mcg Levothyroxine. 02/08/23 TSH, compared to 07/15/22: 5.24, elevated from 3.86. Has been noticing some hair loss.    Elevated Creatinine 1.07, decreased from 1.09 on 11/18/22. Will recheck today, she has been hydrating.   Labs 02/08/23 CBC: WNL CMP, compared to 11/18/22: Creatinine 1.07, decreased from 1.09 but still elevated; otherwise WNL.  HgbA1c: 5.0  PAP Smear 09/26/19 normal. Repeating today.    Mammogram 09/24/22 normal. Repeat 2025.   Bone Density won't be due until she is 58 y.o.   Colonoscopy in November 2022 with Dr. Christella Hartigan, which was normal. Repeat 2032.    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 08/14/2019   Daytime somnolence 05/27/2022   Dilated  cardiomyopathy (HCC) 02/04/2021   Dyslipidemia 07/03/2018   Fluid retention    GERD (gastroesophageal reflux disease)    GERD (gastroesophageal reflux disease)    Heart murmur    infant   High cholesterol    History of obesity 05/27/2022   Hx of adenomatous polyp of colon 07/09/2016   Sessile serrated polyp 11/17   Hypertension    Hypothyroidism 12/03/2015   Insomnia 07/30/2016   Iron deficiency anemia 11/14/2020   Irregular heart beat    Migraines    Non-allergic rhinitis 06/10/2017   OSA (obstructive sleep apnea) 10/04/2017   PID (pelvic inflammatory disease)    Polyphagia 05/27/2022   Prediabetes 10/03/2020   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 Helane Rima. Is on 2.4 gm Wegovy weekly.  Previously tried [employer]. Starting weight 183 pounds March 2021. Current weight is 150 w/BMI 27.44.   History of Obstructive Sleep Apnea managed with sleep assistance apparatus 6-7 hours a night. Seeing sleep disorder physician through [facility].  Had COVID-19 in the Fall 2021 and again in January 2023.   Did have some issues with iron deficiency in 2022.    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   History of chondromalacia right  knee in 2014.  Cellulitis right thigh 2014.  History of right distal phalanx second toe fracture.  History of bacterial vaginosis and Candida vaginitis.   Had negative exercise tolerance test in 2018 for chest pain.   Sclerotherapy for varicose veins in 2004.   History of allergic rhinitis, GE reflux, history of bilateral carpal tunnel syndrome status postsurgical release.  History of hypertension.  History of menorrhagia due to fibroids in  2006.   IUD inserted in 2008.  Sebaceous cyst on her back removed 2008.  History of syncope in 2008 referred for echocardiogram and event monitor.  Had no subsequent issues with syncope.  Tonsillectomy 1972.  Bilateral tubal ligation 1979.  Abdominoplasty 2008.  Breast augmentation 2010.  Right carpal tunnel release February 2010.  Left carpal tunnel release March 2011.  Mirena IUD placed 2013. History of anxiety disorder previously seen at [facility] [place] and which is currently stable and not an issue.  History of palpitations. In 2020 she was referred to cardiology for substernal chest pain with history of tachycardia in the past.  Holter monitor for 5 days showed infrequent PVCs and infrequent premature supraventricular beats with 3 runs of narrow complex tachycardia.  Longest episode was 13 beats at 162 bpm.  Had normal gated myocardial perfusion study in 2020.  Had coronary calcium scoring in January 2023.  Total score was 108. History of SVT seen by Dr. Bing Matter. Thinks metoprolol is causing weight gain.  This was started in August 2020 by cardiologist for tachycardia and atypical chest pain. Family History  Problem Relation Age of Onset   Diabetes Mother    Uterine cancer Mother    Colon polyps Mother    Allergic rhinitis Mother    Hypertension Mother    Thyroid disease Mother    Obesity Mother    Pancreatic cancer Father    Hyperlipidemia Father    Hypertension Father    Crohn's disease Cousin    Diabetes Maternal Grandmother    Stroke Maternal Grandmother    Diabetes Maternal Grandfather    Stroke Paternal Grandmother    Colon cancer Neg Hx    Stomach cancer Neg Hx    Esophageal cancer Neg Hx    Rectal cancer Neg Hx    Liver cancer Neg Hx    Eczema Neg Hx    Urticaria Neg Hx    Asthma Neg Hx     Social History   Social History Narrative   Married twice. Divorced once. First husband was with the [employer]. 1 adult daughter  from that marriage. Current husband retired from [employer] [place] [employer] [place]. She completed 2 years of college. She formerly worked for [employer]. Social alcohol consumption.   Review of Systems  Constitutional:  Negative for fever and malaise/fatigue.  HENT:  Negative for congestion.   Eyes:  Negative for blurred vision.  Respiratory:  Negative for cough and shortness of breath.   Cardiovascular:  Negative for chest pain, palpitations and leg swelling.  Gastrointestinal:  Negative for vomiting.  Musculoskeletal:  Negative for back pain.  Skin:  Negative for rash.       (+) Hair Loss  Neurological:  Negative for loss of consciousness and headaches.    Objective:  Vitals: BP 120/80   Pulse 98   Ht 5\' 2"  (1.575 m)   Wt 150 lb (68 kg)   LMP  (LMP Unknown)   SpO2 98%   BMI 27.44 kg/m  Physical  Exam Vitals and nursing note reviewed.  Constitutional:      General: She is not in acute distress.    Appearance: Normal appearance. She is not ill-appearing or toxic-appearing.  HENT:     Head: Normocephalic and atraumatic.     Right Ear: Hearing, tympanic membrane, ear canal and external ear normal. There is impacted cerumen.     Left Ear: Hearing, tympanic membrane, ear canal and external ear normal.     Mouth/Throat:     Pharynx: Oropharynx is clear.  Eyes:     Extraocular Movements: Extraocular movements intact.     Pupils: Pupils are equal, round, and reactive to light.  Neck:     Thyroid: No thyroid mass, thyromegaly or thyroid tenderness.     Vascular: No carotid bruit.  Cardiovascular:     Rate and Rhythm: Normal rate and regular rhythm. No extrasystoles are present.    Pulses:          Dorsalis pedis pulses are 1+ on the right side and 1+ on the left side.     Heart sounds: Normal heart sounds. No murmur heard.    No friction rub. No gallop.  Pulmonary:     Effort: Pulmonary effort is normal.     Breath sounds: Normal breath sounds. No decreased breath sounds,  wheezing, rhonchi or rales.  Chest:     Chest wall: No mass.  Abdominal:     Palpations: Abdomen is soft. There is no hepatomegaly, splenomegaly or mass.     Tenderness: There is no abdominal tenderness.     Hernia: No hernia is present.  Musculoskeletal:     Cervical back: Normal range of motion.     Right lower leg: No edema.     Left lower leg: No edema.  Lymphadenopathy:     Cervical: No cervical adenopathy.     Upper Body:     Right upper body: No supraclavicular adenopathy.     Left upper body: No supraclavicular adenopathy.  Skin:    General: Skin is warm and dry.     Comments: Minor lipoma noted on right posterior upper arm ~2 cm diameter  Neurological:     General: No focal deficit present.     Mental Status: She is alert and oriented to person, place, and time. Mental status is at baseline.     Sensory: Sensation is intact.     Motor: Motor function is intact. No weakness.     Deep Tendon Reflexes: Reflexes are normal and symmetric.  Psychiatric:        Attention and Perception: Attention normal.        Mood and Affect: Mood normal.        Speech: Speech normal.        Behavior: Behavior normal.        Thought Content: Thought content normal.        Cognition and Memory: Cognition normal.        Judgment: Judgment normal.   Most Recent Fall Risk Assessment:    06/03/2022   11:32 AM  Fall Risk   Falls in the past year? 0  Number falls in past yr: 0  Injury with Fall? 0  Risk for fall due to : No Fall Risks  Follow up Falls prevention discussed   Most Recent Depression Screenings:    06/03/2022   11:32 AM 03/04/2022    4:30 PM  PHQ 2/9 Scores  PHQ - 2 Score 0 0   Results:  Studies obtained and personally reviewed by me:  PAP Smear 09/26/19 normal.  Mammogram 09/24/22 normal. Repeat 2025.   Colonoscopy in November 2022 with Dr. Christella Hartigan, which was normal. Repeat 2032.     Labs:     Component Value Date/Time   NA 142 02/08/2023 0933   NA 141 11/18/2022  1625   K 5.0 02/08/2023 0933   CL 102 02/08/2023 0933   CO2 32 02/08/2023 0933   GLUCOSE 83 02/08/2023 0933   BUN 17 02/08/2023 0933   BUN 16 11/18/2022 1625   CREATININE 1.07 (H) 02/08/2023 0933   CALCIUM 10.1 02/08/2023 0933   PROT 7.2 02/08/2023 0933   PROT 7.5 07/01/2020 1741   ALBUMIN 4.9 02/23/2022 0000   ALBUMIN 4.9 07/01/2020 1741   AST 26 02/08/2023 0933   ALT 15 02/08/2023 0933   ALKPHOS 60 02/23/2022 0000   BILITOT 0.9 02/08/2023 0933   BILITOT 1.0 07/01/2020 1741   GFRNONAA 64 06/22/2019 0915   GFRAA 74 06/22/2019 0915    Lab Results  Component Value Date   WBC 4.1 02/08/2023   HGB 13.0 02/08/2023   HCT 39.4 02/08/2023   MCV 99.2 02/08/2023   PLT 269 02/08/2023   Lab Results  Component Value Date   CHOL 200 (H) 02/08/2023   HDL 92 02/08/2023   LDLCALC 89 02/08/2023   TRIG 93 02/08/2023   CHOLHDL 2.2 02/08/2023   Lab Results  Component Value Date   HGBA1C 5.0 02/08/2023    Lab Results  Component Value Date   TSH 5.24 (H) 02/08/2023    Assessment & Plan:  Other Labs Reviewed today: CBC: WNL CMP, compared to 11/18/22: Creatinine 1.07, decreased from 1.09 but still elevated; otherwise WNL.  HgbA1c: 5.0  Elevated Creatinine 1.07, decreased from 1.09 on 11/18/22. Will recheck today, she has been hydrating.   Minor Mass seems to be a ~ 2 cm benign lipoma on her right posterior upper arm   Hypertension treated with 25 mg Losartan BID, 25 mg Metoprolol succinate daily, Imdur 30 mg daily, and 0.4 mg Nitrostat as needed. Blood Pressure: normotensive at 120/80.   Hyperlipidemia treated with 5 mg Rosuvastatin 3x/week - can not tolerate daily dosing. 02/08/23 Lipid Panel, compared to 09/25/21: Cholesterol 200, elevated from 193; otherwise WNL. Does note that she does not eat frequently consume fried foods or red meats. Will recheck Lipid Panel in 6 months and consider adding Zetia to medication regimen.  Hypothyroidism treated with 88 mcg Levothyroxine. 02/08/23  TSH, compared to 07/15/22: 5.24, elevated from 3.86. Has been noticing some hair loss. Increasing Levothyroxine to 100 mcg. Recheck TSH in 6 weeks.      PAP Smear 09/26/19 normal. Repeating today.   Mammogram 09/24/22 normal. Repeat 2025.   Bone Density won't be due until she is 58 y.o.   Colonoscopy in November 2022 with Dr. Christella Hartigan, which was normal. Repeat 2032.    Vaccine Counseling: Due for PNA and Flu; UTD on Shingles & Tdap   Annual wellness visit done today including the all of the following: Reviewed patient's Family Medical History Reviewed and updated list of patient's medical providers Assessment of cognitive impairment was done Assessed patient's functional ability Established a written schedule for health screening services Health Risk Assessent Completed and Reviewed  Discussed health benefits of physical activity, and encouraged her to engage in regular exercise appropriate for her age and condition.    I,Emily Lagle,acting as a Neurosurgeon for Margaree Mackintosh, MD.,have documented all relevant documentation on the  behalf of Margaree Mackintosh, MD,as directed by  Margaree Mackintosh, MD while in the presence of Margaree Mackintosh, MD.   I, Margaree Mackintosh, MD, have reviewed all documentation for this visit. The documentation on 02/15/23 for the exam, diagnosis, procedures, and orders are all accurate and complete.

## 2023-02-15 ENCOUNTER — Encounter: Payer: Self-pay | Admitting: Internal Medicine

## 2023-02-15 LAB — BASIC METABOLIC PANEL
BUN: 25 mg/dL (ref 7–25)
CO2: 31 mmol/L (ref 20–32)
Calcium: 9.9 mg/dL (ref 8.6–10.4)
Chloride: 102 mmol/L (ref 98–110)
Creat: 0.98 mg/dL (ref 0.50–1.03)
Glucose, Bld: 74 mg/dL (ref 65–99)
Potassium: 4.2 mmol/L (ref 3.5–5.3)
Sodium: 140 mmol/L (ref 135–146)

## 2023-02-15 NOTE — Patient Instructions (Addendum)
It was a pleasure to see you today. Pap smear done today in office. Continue current meds and return in on year or as needed. No change in medications. Continue Cardiology follow up.

## 2023-02-17 LAB — CYTOLOGY - PAP: Diagnosis: NEGATIVE

## 2023-03-02 ENCOUNTER — Other Ambulatory Visit (HOSPITAL_COMMUNITY): Payer: Self-pay

## 2023-03-04 ENCOUNTER — Other Ambulatory Visit: Payer: Self-pay

## 2023-03-28 ENCOUNTER — Other Ambulatory Visit: Payer: 59

## 2023-03-28 DIAGNOSIS — E039 Hypothyroidism, unspecified: Secondary | ICD-10-CM

## 2023-03-29 ENCOUNTER — Other Ambulatory Visit: Payer: Self-pay | Admitting: Internal Medicine

## 2023-03-29 LAB — TSH: TSH: 2.53 m[IU]/L (ref 0.40–4.50)

## 2023-03-31 ENCOUNTER — Other Ambulatory Visit: Payer: Self-pay | Admitting: Internal Medicine

## 2023-03-31 ENCOUNTER — Encounter: Payer: Self-pay | Admitting: Cardiology

## 2023-04-01 ENCOUNTER — Encounter: Payer: Self-pay | Admitting: Cardiology

## 2023-04-01 ENCOUNTER — Ambulatory Visit: Attending: Cardiology | Admitting: Cardiology

## 2023-04-01 VITALS — BP 100/70 | HR 85 | Ht 62.0 in | Wt 154.8 lb

## 2023-04-01 DIAGNOSIS — I420 Dilated cardiomyopathy: Secondary | ICD-10-CM | POA: Diagnosis not present

## 2023-04-01 DIAGNOSIS — I25118 Atherosclerotic heart disease of native coronary artery with other forms of angina pectoris: Secondary | ICD-10-CM | POA: Diagnosis not present

## 2023-04-01 DIAGNOSIS — E785 Hyperlipidemia, unspecified: Secondary | ICD-10-CM

## 2023-04-01 DIAGNOSIS — G4733 Obstructive sleep apnea (adult) (pediatric): Secondary | ICD-10-CM

## 2023-04-01 NOTE — Progress Notes (Signed)
 Cardiology Office Note:    Date:  04/01/2023   ID:  Rebecca Mccoy, DOB 05-26-1965, MRN [id number]  PCP:  Margaree Mackintosh, MD  Cardiologist:  Gypsy Balsam, MD    Referring MD: Margaree Mackintosh, MD   Chief Complaint  Patient presents with   Follow-up   Medication Management    Metoprolol    History of Present Illness:    DODI LEU is a 58 y.o. female past medical history significant for nonischemic cardiomyopathy initially detected in January 2023 with ejection fraction 45%.  Guideline directed medical therapy initiated, normalization of ejection fraction cardiac catheterization showing no obstructive disease done after that she got stress test done showing ischemia in [place] cardiac cath after that showed nonobstructive disease again.  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 08/14/2019   Daytime somnolence 05/27/2022   Dilated cardiomyopathy (HCC) 02/04/2021   Dyslipidemia 07/03/2018   Fluid retention    GERD (gastroesophageal reflux disease)    GERD (gastroesophageal reflux disease)    Heart murmur    infant   High cholesterol    History of obesity 05/27/2022   Hx of adenomatous polyp of colon 07/09/2016   Sessile serrated polyp 11/17   Hypertension    Hypothyroidism 12/03/2015   Insomnia 07/30/2016   Iron deficiency anemia 11/14/2020   Irregular heart beat    Migraines    Non-allergic rhinitis 06/10/2017   OSA (obstructive sleep apnea) 10/04/2017   PID (pelvic inflammatory disease)    Polyphagia 05/27/2022   Prediabetes 10/03/2020   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   Recurrent  upper respiratory infection (URI)    April 2019   Sleep apnea    Supraventricular tachycardia (HCC) 08/22/2018   Thyroid disease    Urticaria     Past Surgical History:  Procedure Laterality Date   AUGMENTATION MAMMAPLASTY Bilateral 2005   BREAST SURGERY  2005   Augmentation   CARPAL TUNNEL RELEASE Bilateral    R in '07 and L '06   COLONOSCOPY  2017   COSMETIC SURGERY     LEFT HEART CATH AND CORONARY ANGIOGRAPHY N/A 11/25/2022   Procedure: LEFT HEART CATH AND CORONARY ANGIOGRAPHY;  Surgeon: Yvonne Kendall, MD;  Location: [facility];  Service: Cardiovascular;  Laterality: N/A;   leg lift     TONSILLECTOMY  1972   TUBAL LIGATION  1989   tummy tuck  2005    Current Medications: Current Meds  Medication Sig   albuterol (VENTOLIN HFA) 108 (90 Base) MCG/ACT inhaler Inhale 1-2 puffs into the lungs every 6 (six) hours as needed for wheezing or shortness of breath.   ALPRAZolam (XANAX) 1 MG tablet Take 1 tablet by mouth twice daily as needed for anxiety   aspirin EC 81 MG tablet Take 1 tablet (81 mg total) by mouth daily. Swallow whole.   budesonide-formoterol (SYMBICORT) 160-4.5 MCG/ACT inhaler Inhale 2 puffs into the lungs 2 (two) times daily. (Patient taking differently: Inhale 2 puffs into the lungs 2 (two) times daily as needed (shortness of breath).)   glucosamine-chondroitin 500-400 MG tablet Take 1  tablet by mouth daily.   isosorbide mononitrate (IMDUR) 30 MG 24 hr tablet Take 1 tablet (30 mg total) by mouth daily.   levothyroxine (SYNTHROID) 100 MCG tablet Take 1 tablet (100 mcg total) by mouth daily.   losartan (COZAAR) 25 MG tablet Take 1 tablet (25 mg total) by mouth 2 (two) times daily.   metoprolol succinate (TOPROL-XL) 50 MG 24 hr tablet TAKE 1/2 (ONE-HALF) TABLET BY MOUTH ONCE DAILY WITH OR IMMEDIATELY FOLLOWING A MEAL (Patient taking differently: Take 25 mg by mouth daily.)   montelukast (SINGULAIR) 4 MG PACK Take 4 mg by mouth at bedtime.   Multiple Vitamin  (MULTIVITAMIN) capsule Take 1 capsule by mouth daily. Unknown strength   nitroGLYCERIN (NITROSTAT) 0.4 MG SL tablet Place 1 tablet (0.4 mg total) under the tongue every 5 (five) minutes as needed for chest pain.   Omega-3 Fatty Acids (FISH OIL) 1000 MG CAPS Take 2 capsules by mouth daily.   omeprazole (PRILOSEC) 20 MG capsule Take 20 mg by mouth daily.   rosuvastatin (CRESTOR) 5 MG tablet TAKE 1 TABLET BY MOUTH THREE TIMES A WEEK (Patient taking differently: Take 5 mg by mouth once a week.)   Semaglutide-Weight Management (WEGOVY) 2.4 MG/0.75ML SOAJ Inject 2.4 mg into the skin once a week.   triamterene-hydrochlorothiazide (MAXZIDE-25) 37.5-25 MG tablet Take 0.5 tablets by mouth every other day.   TURMERIC PO Take 1,000 mg by mouth daily. Unknown strength     Allergies:   Patient has no known allergies.   Social History   Socioeconomic History   Marital status: Married    Spouse name: Not on file   Number of children: 2   Years of education: Not on file   Highest education level: Associate degree: academic program  Occupational History   Occupation: Designer, industrial/product  Tobacco Use   Smoking status: Never   Smokeless tobacco: Never  Vaping Use   Vaping status: Never Used  Substance and Sexual Activity   Alcohol use: Yes    Alcohol/week: 0.0 - 2.0 standard drinks of alcohol    Comment: 1-2 beer weekly   Drug use: No   Sexual activity: Yes    Partners: Male    Birth control/protection: Surgical, Post-menopausal    Comment: INTERCOUSE AGE 89, SEXUAL PARTNERS MORE THAN 5  Other Topics Concern   Not on file  Social History Narrative   Married twice. Divorced once. First husband was with the [employer]. 1 adult daughter from that marriage. Current husband retired from [place] of [place]. She completed 2 years of college. She formerly worked for [employer]. Social alcohol consumption.   Social Drivers of Corporate investment banker Strain: Low Risk   (02/11/2023)   Overall Financial Resource Strain (CARDIA)    Difficulty of Paying Living Expenses: Not hard at all  Food Insecurity: No Food Insecurity (02/11/2023)   Hunger Vital Sign    Worried About Running Out of Food in the Last Year: Never true    Ran Out of Food in the Last Year: Never true  Transportation Needs: No Transportation Needs (02/11/2023)   PRAPARE - Administrator, Civil Service (Medical): No    Lack of Transportation (Non-Medical): No  Physical Activity: Sufficiently Active (02/11/2023)   Exercise Vital Sign    Days of Exercise per Week: 3 days    Minutes of Exercise per Session: 60 min  Recent Concern: Physical Activity - Insufficiently Active (12/20/2022)   Exercise Vital Sign  Days of Exercise per Week: 3 days    Minutes of Exercise per Session: 30 min  Stress: No Stress Concern Present (02/11/2023)   [employer] of Occupational Health - Occupational Stress Questionnaire    Feeling of Stress : Not at all  Social Connections: Socially Integrated (02/11/2023)   Social Connection and Isolation Panel [NHANES]    Frequency of Communication with Friends and Family: More than three times a week    Frequency of Social Gatherings with Friends and Family: Three times a week    Attends Religious Services: More than 4 times per year    Active Member of Clubs or Organizations: Yes    Attends Engineer, structural: More than 4 times per year    Marital Status: Married     Family History: The patient's family history includes Allergic rhinitis in her mother; Colon polyps in her mother; Crohn's disease in her cousin; Diabetes in her maternal grandfather, maternal grandmother, and mother; Hyperlipidemia in her father; Hypertension in her father and mother; Obesity in her mother; Pancreatic cancer in her father; Stroke in her maternal grandmother and paternal grandmother; Thyroid disease in her mother; Uterine cancer in her mother. There is no history of  Colon cancer, Stomach cancer, Esophageal cancer, Rectal cancer, Liver cancer, Eczema, Urticaria, or Asthma. ROS:   Please see the history of present illness.    All 14 point review of systems negative except as described per history of present illness  EKGs/Labs/Other Studies Reviewed:         Recent Labs: 02/08/2023: ALT 15; Hemoglobin 13.0; Platelets 269 02/14/2023: BUN 25; Creat 0.98; Potassium 4.2; Sodium 140 03/28/2023: TSH 2.53  Recent Lipid Panel    Component Value Date/Time   CHOL 200 (H) 02/08/2023 0933   TRIG 93 02/08/2023 0933   HDL 92 02/08/2023 0933   CHOLHDL 2.2 02/08/2023 0933   VLDL 22 07/26/2016 0914   LDLCALC 89 02/08/2023 0933    Physical Exam:    VS:  BP 100/70 (BP Location: Right Arm, Patient Position: Sitting)   Pulse 85   Ht 5\' 2"  (1.575 m)   Wt 154 lb 12.8 oz (70.2 kg)   LMP  (LMP Unknown)   SpO2 95%   BMI 28.31 kg/m     Wt Readings from Last 3 Encounters:  04/01/23 154 lb 12.8 oz (70.2 kg)  02/14/23 150 lb (68 kg)  12/21/22 148 lb (67.1 kg)     GEN:  Well nourished, well developed in no acute distress HEENT: Normal NECK: No JVD; No carotid bruits LYMPHATICS: No lymphadenopathy CARDIAC: RRR, no murmurs, no rubs, no gallops RESPIRATORY:  Clear to auscultation without rales, wheezing or rhonchi  ABDOMEN: Soft, non-tender, non-distended MUSCULOSKELETAL:  No edema; No deformity  SKIN: Warm and dry LOWER EXTREMITIES: no swelling NEUROLOGIC:  Alert and oriented x 3 PSYCHIATRIC:  Normal affect   ASSESSMENT:    1. Dilated cardiomyopathy (HCC)   2. Coronary artery disease of native artery of native heart with stable angina pectoris (HCC)   3. OSA (obstructive sleep apnea)   4. Dyslipidemia    PLAN:    In order of problems listed above:  History of cardiomyopathy normalization ejection fraction continue present management. Obstructive sleep apnea followed by internal medicine team. Coronary disease nonobstructive risk factors  modifications which we will continue. I encouraged her to exercise on the regular basis she is doing overall very well   Medication Adjustments/Labs and Tests Ordered: Current medicines are reviewed at length with the  patient today.  Concerns regarding medicines are outlined above.  No orders of the defined types were placed in this encounter.  Medication changes: No orders of the defined types were placed in this encounter.   Signed, Georgeanna Lea, MD, [facility] 04/01/2023 3:57 PM    [facility]

## 2023-04-01 NOTE — Patient Instructions (Signed)

## 2023-04-04 ENCOUNTER — Ambulatory Visit: Payer: 59 | Admitting: Cardiology

## 2023-04-07 ENCOUNTER — Other Ambulatory Visit (HOSPITAL_COMMUNITY): Payer: Self-pay

## 2023-04-08 ENCOUNTER — Other Ambulatory Visit (HOSPITAL_COMMUNITY): Payer: Self-pay

## 2023-04-08 MED ORDER — WEGOVY 2.4 MG/0.75ML ~~LOC~~ SOAJ
2.4000 mg | SUBCUTANEOUS | 0 refills | Status: DC
  Filled 2023-04-08: qty 3, 28d supply, fill #0

## 2023-04-18 ENCOUNTER — Other Ambulatory Visit (HOSPITAL_COMMUNITY): Payer: Self-pay

## 2023-04-18 MED ORDER — ZEPBOUND 12.5 MG/0.5ML ~~LOC~~ SOAJ
12.5000 mg | SUBCUTANEOUS | 0 refills | Status: DC
  Filled 2023-05-28: qty 2, 28d supply, fill #0

## 2023-04-18 MED ORDER — ZEPBOUND 15 MG/0.5ML ~~LOC~~ SOAJ
15.0000 mg | SUBCUTANEOUS | 0 refills | Status: DC
  Filled 2023-06-23: qty 2, 28d supply, fill #0
  Filled 2023-07-21: qty 2, 28d supply, fill #1

## 2023-04-18 MED ORDER — ZEPBOUND 10 MG/0.5ML ~~LOC~~ SOAJ
10.0000 mg | SUBCUTANEOUS | 0 refills | Status: DC
  Filled 2023-04-18 – 2023-04-28 (×3): qty 2, 28d supply, fill #0

## 2023-04-19 ENCOUNTER — Other Ambulatory Visit (HOSPITAL_COMMUNITY): Payer: Self-pay

## 2023-04-21 ENCOUNTER — Other Ambulatory Visit (HOSPITAL_COMMUNITY): Payer: Self-pay

## 2023-04-21 MED ORDER — ALPRAZOLAM 0.25 MG PO TABS
0.2500 mg | ORAL_TABLET | Freq: Every day | ORAL | 0 refills | Status: DC
  Filled 2023-04-21: qty 60, 30d supply, fill #0

## 2023-04-24 ENCOUNTER — Other Ambulatory Visit: Payer: Self-pay | Admitting: Cardiology

## 2023-04-26 ENCOUNTER — Other Ambulatory Visit (HOSPITAL_COMMUNITY): Payer: Self-pay

## 2023-04-28 ENCOUNTER — Other Ambulatory Visit: Payer: Self-pay

## 2023-04-28 ENCOUNTER — Other Ambulatory Visit (HOSPITAL_COMMUNITY): Payer: Self-pay

## 2023-05-07 ENCOUNTER — Other Ambulatory Visit: Payer: Self-pay | Admitting: Internal Medicine

## 2023-05-09 ENCOUNTER — Other Ambulatory Visit: Payer: Self-pay | Admitting: Family

## 2023-05-09 MED ORDER — LEVOTHYROXINE SODIUM 100 MCG PO TABS: 100.0000 ug | ORAL_TABLET | Freq: Every day | ORAL | 1 refills | Status: DC

## 2023-05-14 ENCOUNTER — Other Ambulatory Visit: Payer: Self-pay | Admitting: Cardiology

## 2023-05-29 ENCOUNTER — Other Ambulatory Visit: Payer: Self-pay

## 2023-06-14 ENCOUNTER — Other Ambulatory Visit (HOSPITAL_COMMUNITY): Payer: Self-pay

## 2023-06-14 MED ORDER — PREMARIN 0.625 MG/GM VA CREA
1.0000 | TOPICAL_CREAM | Freq: Every day | VAGINAL | 0 refills | Status: DC
  Filled 2023-06-14: qty 30, 30d supply, fill #0

## 2023-06-23 ENCOUNTER — Other Ambulatory Visit (HOSPITAL_COMMUNITY): Payer: Self-pay

## 2023-06-23 ENCOUNTER — Other Ambulatory Visit: Payer: Self-pay | Admitting: Internal Medicine

## 2023-07-02 ENCOUNTER — Other Ambulatory Visit (HOSPITAL_COMMUNITY): Payer: Self-pay

## 2023-07-06 ENCOUNTER — Other Ambulatory Visit (HOSPITAL_COMMUNITY): Payer: Self-pay

## 2023-07-06 MED ORDER — ALPRAZOLAM 0.25 MG PO TABS
0.2500 mg | ORAL_TABLET | Freq: Every evening | ORAL | 0 refills | Status: DC
  Filled 2023-07-06 – 2023-07-25 (×2): qty 60, 30d supply, fill #0

## 2023-07-19 ENCOUNTER — Other Ambulatory Visit (HOSPITAL_COMMUNITY): Payer: Self-pay

## 2023-07-25 ENCOUNTER — Other Ambulatory Visit (HOSPITAL_COMMUNITY): Payer: Self-pay

## 2023-07-29 ENCOUNTER — Other Ambulatory Visit (HOSPITAL_COMMUNITY): Payer: Self-pay

## 2023-07-29 MED ORDER — TRIAMTERENE-HCTZ 37.5-25 MG PO TABS
0.5000 | ORAL_TABLET | Freq: Every morning | ORAL | 3 refills | Status: AC
  Filled 2023-07-29: qty 15, 30d supply, fill #0
  Filled 2023-07-30: qty 30, 60d supply, fill #0
  Filled 2023-08-08: qty 15, 30d supply, fill #0
  Filled 2023-09-06: qty 15, 30d supply, fill #1
  Filled 2023-10-06: qty 15, 30d supply, fill #2
  Filled 2023-11-05: qty 15, 30d supply, fill #3
  Filled 2023-12-07: qty 15, 30d supply, fill #4
  Filled 2024-01-04: qty 15, 30d supply, fill #5
  Filled 2024-02-08: qty 15, 30d supply, fill #6

## 2023-07-29 MED ORDER — WEGOVY 2.4 MG/0.75ML ~~LOC~~ SOAJ
2.4000 mg | SUBCUTANEOUS | 1 refills | Status: DC
  Filled 2023-07-29 – 2023-08-18 (×4): qty 3, 28d supply, fill #0
  Filled 2023-09-12: qty 3, 28d supply, fill #1

## 2023-07-31 ENCOUNTER — Other Ambulatory Visit (HOSPITAL_COMMUNITY): Payer: Self-pay

## 2023-08-02 ENCOUNTER — Other Ambulatory Visit (HOSPITAL_COMMUNITY): Payer: Self-pay

## 2023-08-05 ENCOUNTER — Other Ambulatory Visit (HOSPITAL_COMMUNITY): Payer: Self-pay

## 2023-08-08 ENCOUNTER — Other Ambulatory Visit: Payer: Self-pay

## 2023-08-08 ENCOUNTER — Other Ambulatory Visit (HOSPITAL_COMMUNITY): Payer: Self-pay

## 2023-08-18 ENCOUNTER — Other Ambulatory Visit: Payer: Self-pay

## 2023-08-18 ENCOUNTER — Other Ambulatory Visit (HOSPITAL_COMMUNITY): Payer: Self-pay

## 2023-08-23 ENCOUNTER — Other Ambulatory Visit (HOSPITAL_COMMUNITY): Payer: Self-pay

## 2023-09-07 ENCOUNTER — Telehealth: Payer: Self-pay | Admitting: Cardiology

## 2023-09-07 NOTE — Telephone Encounter (Signed)
 Caller corrected fax number to read - fax# [phone]

## 2023-09-07 NOTE — Telephone Encounter (Signed)
 Patient hasn't been seen since March. If dentist needs clearance will need to request/reschedule. She is not on anticoagulation. Cath showed non-obstructive disease. Should would require a televisit for cardiac clearance. Can't give cardiac clearance while she is in the chair just pharmacy clearance

## 2023-09-07 NOTE — Telephone Encounter (Signed)
   Pre-operative Risk Assessment    Patient Name: Rebecca Mccoy  DOB: 11-19-1965 MRN: [id number]   Date of last office visit: 04/01/23 Date of next office visit: 11/04/23   Request for Surgical Clearance    Procedure:  Root canal  Date of Surgery:  Clearance 09/07/23                                Surgeon:  Dr. Janice Surgeon's Group or Practice Name:  [facility] Phone number:  [phone]  Fax number:  [phone]   Type of Clearance Requested:   - Medical  - Pharmacy:  Hold        Type of Anesthesia:  Local    Additional requests/questions:  Caller Martene) stated patient is in the dental chair and will be having a root canal.  Signed, Jasmin B Wilson   09/07/2023, 8:54 AM      Caller Martene) stated patient is in the dental chair and will be having a root canal.

## 2023-09-07 NOTE — Telephone Encounter (Signed)
 I s/w the pt and she tells me that Dr. Janice said the tooth and the roots looked good on the tooth that was possibly going to need a root canal. Pt said at this point she will not be needing the root canal at this time. She said she will d/w Dr.  Bernie further in 10/2023 appt if need preop clearance then. Pt thanked me for the help and the time.

## 2023-09-07 NOTE — Telephone Encounter (Signed)
 Will send to preop APP for recommendations

## 2023-09-10 ENCOUNTER — Other Ambulatory Visit: Payer: Self-pay | Admitting: Internal Medicine

## 2023-09-13 ENCOUNTER — Other Ambulatory Visit (HOSPITAL_COMMUNITY): Payer: Self-pay

## 2023-09-27 ENCOUNTER — Encounter: Payer: Self-pay | Admitting: Internal Medicine

## 2023-09-28 ENCOUNTER — Telehealth: Payer: Self-pay | Admitting: Internal Medicine

## 2023-09-28 ENCOUNTER — Other Ambulatory Visit (HOSPITAL_COMMUNITY): Payer: Self-pay

## 2023-09-28 MED ORDER — ALPRAZOLAM 0.25 MG PO TABS
0.2500 mg | ORAL_TABLET | Freq: Every evening | ORAL | 0 refills | Status: DC
  Filled 2023-09-28: qty 60, 30d supply, fill #0

## 2023-09-28 NOTE — Telephone Encounter (Signed)
 Refilled Xanax  which patient takes at bedtime. Previously filled by Dr. Prentiss at [facility]. Sent in #[phone]. Patient reports she is trying to wean off of this med. [place] PMP aware checked. MJB, MD

## 2023-10-05 ENCOUNTER — Telehealth: Payer: Self-pay | Admitting: Internal Medicine

## 2023-10-05 ENCOUNTER — Encounter: Payer: Self-pay | Admitting: Internal Medicine

## 2023-10-05 DIAGNOSIS — Z0000 Encounter for general adult medical examination without abnormal findings: Secondary | ICD-10-CM

## 2023-10-06 ENCOUNTER — Other Ambulatory Visit: Payer: Self-pay | Admitting: Internal Medicine

## 2023-10-06 DIAGNOSIS — R928 Other abnormal and inconclusive findings on diagnostic imaging of breast: Secondary | ICD-10-CM

## 2023-10-12 ENCOUNTER — Other Ambulatory Visit: Payer: Self-pay | Admitting: Internal Medicine

## 2023-10-12 DIAGNOSIS — R2231 Localized swelling, mass and lump, right upper limb: Secondary | ICD-10-CM

## 2023-10-13 ENCOUNTER — Other Ambulatory Visit: Payer: Self-pay

## 2023-10-13 ENCOUNTER — Other Ambulatory Visit (HOSPITAL_COMMUNITY): Payer: Self-pay

## 2023-10-13 MED ORDER — WEGOVY 2.4 MG/0.75ML ~~LOC~~ SOAJ
2.4000 mg | SUBCUTANEOUS | 0 refills | Status: AC
Start: 2023-10-13 — End: ?
  Filled 2023-10-13: qty 3, 28d supply, fill #0
  Filled 2023-11-05: qty 3, 28d supply, fill #1
  Filled 2023-12-07: qty 3, 28d supply, fill #2

## 2023-10-17 ENCOUNTER — Ambulatory Visit
Admission: RE | Admit: 2023-10-17 | Discharge: 2023-10-17 | Disposition: A | Discharge status: Home / Self Care | Source: Ambulatory Visit | Attending: Internal Medicine | Admitting: Internal Medicine

## 2023-10-17 ENCOUNTER — Ambulatory Visit: Payer: Self-pay | Admitting: Internal Medicine

## 2023-10-17 ENCOUNTER — Ambulatory Visit

## 2023-10-17 ENCOUNTER — Other Ambulatory Visit: Payer: Self-pay | Admitting: Internal Medicine

## 2023-10-17 DIAGNOSIS — R2231 Localized swelling, mass and lump, right upper limb: Secondary | ICD-10-CM

## 2023-11-04 ENCOUNTER — Encounter: Payer: Self-pay | Admitting: Cardiology

## 2023-11-04 ENCOUNTER — Ambulatory Visit: Attending: Cardiology | Admitting: Cardiology

## 2023-11-04 VITALS — BP 116/82 | HR 88 | Ht 62.0 in | Wt 160.2 lb

## 2023-11-04 DIAGNOSIS — I420 Dilated cardiomyopathy: Secondary | ICD-10-CM

## 2023-11-04 DIAGNOSIS — E785 Hyperlipidemia, unspecified: Secondary | ICD-10-CM | POA: Diagnosis not present

## 2023-11-04 DIAGNOSIS — I25118 Atherosclerotic heart disease of native coronary artery with other forms of angina pectoris: Secondary | ICD-10-CM

## 2023-11-04 NOTE — Progress Notes (Unsigned)
 Cardiology Office Note:    Date:  11/04/2023   ID:  Rebecca Mccoy, DOB [birth year], MRN [id number]  PCP:  Perri Ronal PARAS, MD  Cardiologist:  Lamar Fitch, MD    Referring MD: Perri Ronal PARAS, MD   No chief complaint on file. Doing well  Nuclear portion of the stress test:  Resting images were abnormal showing mild defect involving the basal mid and apical portion of the inferior wall as well as apical portion anterior wall Stress images were abnormal showing mild defect involving basal mid and apical portion of the inferior wall as well as apical portion of the anterior wall Gated images were normal showing normal contractility in all segments overall normal left ventricular ejection fraction calculated to be of 79%  Summary and conclusions: 1.  No ischemia seen on scan. 2.  Normal ejection fraction. 3.  Normal gated images.  Comments: Fixed defect involving inferior wall representing diaphragmatic attenuation, fixed defect involving the apical portion of the inferior wall is most like related to breast attenuation  History of Present Illness:    Rebecca Mccoy is a 58 y.o. female past medical history significant for nonischemic cardiomyopathy initially detected in general in 2023 with ejection fraction of 45%, guideline directed medical therapy has been initiated since that time normalization of ejection fraction, cardiac catheterization done showing nonobstructive disease.  Comes today to months for follow-up overall doing well still exercise on the regular basis rare episode of chest pain but overall doing well  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 08/14/2019   Coronary artery disease luminal based on cardiac cath from 2024 12/03/2022   Daytime somnolence 05/27/2022   Dilated cardiomyopathy (HCC) 02/04/2021    Dyslipidemia 07/03/2018   Fluid retention    GERD (gastroesophageal reflux disease)    GERD (gastroesophageal reflux disease)    Heart murmur    infant   High cholesterol    History of obesity 05/27/2022   Hx of adenomatous polyp of colon 07/09/2016   Sessile serrated polyp 11/17   Hypertension    Hypothyroidism 12/03/2015   Insomnia 07/30/2016   Iron deficiency anemia 11/14/2020   Irregular heart beat    Migraines    Non-allergic rhinitis 06/10/2017   OSA (obstructive sleep apnea) 10/04/2017   PID (pelvic inflammatory disease)    Polyphagia 05/27/2022   Prediabetes 10/03/2020   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   Recurrent upper respiratory infection (URI)    April 2019   Sleep apnea    Supraventricular tachycardia 08/22/2018   Thyroid  disease    Urticaria     Past Surgical History:  Procedure Laterality Date   AUGMENTATION MAMMAPLASTY Bilateral 2005   BREAST SURGERY  2005   Augmentation   CARPAL TUNNEL RELEASE Bilateral    R in '07 and L '06   COLONOSCOPY  2017   COSMETIC SURGERY     LEFT HEART CATH AND CORONARY ANGIOGRAPHY N/A 11/25/2022   Procedure: LEFT HEART CATH AND CORONARY ANGIOGRAPHY;  Surgeon: Mady Bruckner, MD;  Location: [facility];  Service: Cardiovascular;  Laterality: N/A;   leg lift     TONSILLECTOMY  1972   TUBAL LIGATION  1989   tummy tuck  2005    Current Medications: Current Meds  Medication Sig   albuterol  (VENTOLIN  HFA)  108 (90 Base) MCG/ACT inhaler Inhale 1-2 puffs into the lungs every 6 (six) hours as needed for wheezing or shortness of breath.   ALPRAZolam  (XANAX ) 0.25 MG tablet Take 1-2 tablets (0.25-0.5 mg total) by mouth at bedtime.   aspirin  EC 81 MG tablet Take 1 tablet (81 mg total) by mouth daily. Swallow whole.   budesonide -formoterol  (SYMBICORT ) 160-4.5 MCG/ACT inhaler Inhale 2 puffs into the lungs 2 (two) times daily. (Patient taking differently: Inhale 2 puffs into the lungs 2 (two) times daily as  needed (shortness of breath).)   glucosamine-chondroitin 500-400 MG tablet Take 1 tablet by mouth daily.   isosorbide  mononitrate (IMDUR ) 30 MG 24 hr tablet Take 1 tablet (30 mg total) by mouth daily.   levothyroxine  (SYNTHROID ) 100 MCG tablet Take 1 tablet (100 mcg total) by mouth daily.   losartan  (COZAAR ) 25 MG tablet Take 1 tablet by mouth twice daily (Patient taking differently: Take 25 mg by mouth daily.)   MELATONIN PO Take 7.5 mg by mouth at bedtime.   metoprolol  succinate (TOPROL -XL) 50 MG 24 hr tablet TAKE 1/2 (ONE-HALF) TABLET BY MOUTH ONCE DAILY WITH OR IMMEDIATELY FOLLOWING A MEAL (Patient taking differently: Take 25 mg by mouth daily.)   montelukast  (SINGULAIR ) 4 MG PACK Take 4 mg by mouth at bedtime.   Multiple Vitamin (MULTIVITAMIN) capsule Take 1 capsule by mouth daily. Unknown strength   nitroGLYCERIN  (NITROSTAT ) 0.4 MG SL tablet Place 1 tablet (0.4 mg total) under the tongue every 5 (five) minutes as needed for chest pain.   Omega-3 Fatty Acids (FISH OIL) 1000 MG CAPS Take 2 capsules by mouth daily.   omeprazole (PRILOSEC) 20 MG capsule Take 20 mg by mouth daily.   ranolazine  (RANEXA ) 500 MG 12 hr tablet Take 500 mg by mouth 2 (two) times daily.   rosuvastatin  (CRESTOR ) 5 MG tablet TAKE 1 TABLET BY MOUTH THREE TIMES A WEEK   semaglutide -weight management (WEGOVY ) 2.4 MG/0.75ML SOAJ SQ injection Inject 2.4 mg into the skin once a week.   Semaglutide -Weight Management (WEGOVY ) 2.4 MG/0.75ML SOAJ Inject 2.4 mg into the skin once a week.   triamterene -hydrochlorothiazide  (MAXZIDE -25) 37.5-25 MG tablet Take 0.5 tablets by mouth every other day.   triamterene -hydrochlorothiazide  (MAXZIDE -25) 37.5-25 MG tablet Take 1/2 tablet by mouth every morning.   TURMERIC PO Take 1,000 mg by mouth daily. Unknown strength     Allergies:   Patient has no known allergies.   Social History   Socioeconomic History   Marital status: Married    Spouse name: Not on file   Number of children: 2    Years of education: Not on file   Highest education level: Associate degree: academic program  Occupational History   Occupation: Designer, industrial/product  Tobacco Use   Smoking status: Never   Smokeless tobacco: Never  Vaping Use   Vaping status: Never Used  Substance and Sexual Activity   Alcohol use: Yes    Alcohol/week: 0.0 - 2.0 standard drinks of alcohol    Comment: 1-2 beer weekly   Drug use: No   Sexual activity: Yes    Partners: Male    Birth control/protection: Surgical, Post-menopausal    Comment: INTERCOUSE AGE 46, SEXUAL PARTNERS MORE THAN 5  Other Topics Concern   Not on file  Social History Narrative   Married twice. Divorced once. First husband was with the [employer]. 1 adult daughter from that marriage. Current husband retired from [employer]. She completed 2 years of college. 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 Social alcohol consumption.   Social Drivers of Corporate investment banker Strain: Low Risk  (02/11/2023)   Overall Financial Resource Strain (CARDIA)    Difficulty of Paying Living Expenses: Not hard at all  Food Insecurity: No Food Insecurity (02/11/2023)   Hunger Vital Sign    Worried About Running Out of Food in the Last Year: Never true    Ran Out of Food in the Last Year: Never true  Transportation Needs: No Transportation Needs (02/11/2023)   PRAPARE - Administrator, Civil Service (Medical): No    Lack of Transportation (Non-Medical): No  Physical Activity: Sufficiently Active (02/11/2023)   Exercise Vital Sign    Days of Exercise per Week: 3 days    Minutes of Exercise per Session: 60 min  Recent Concern: Physical Activity - Insufficiently Active (12/20/2022)   Exercise Vital Sign    Days of Exercise per Week: 3 days    Minutes of Exercise per Session: 30 min  Stress: No Stress Concern Present (02/11/2023)   [employer] of Occupational Health - Occupational Stress Questionnaire    Feeling of  Stress : Not at all  Social Connections: Socially Integrated (02/11/2023)   Social Connection and Isolation Panel    Frequency of Communication with Friends and Family: More than three times a week    Frequency of Social Gatherings with Friends and Family: Three times a week    Attends Religious Services: More than 4 times per year    Active Member of Clubs or Organizations: Yes    Attends Engineer, structural: More than 4 times per year    Marital Status: Married     Family History: The patient's family history includes Allergic rhinitis in her mother; Colon polyps in her mother; Crohn's disease in her cousin; Diabetes in her maternal grandfather, maternal grandmother, and mother; Hyperlipidemia in her father; Hypertension in her father and mother; Obesity in her mother; Pancreatic cancer in her father; Stroke in her maternal grandmother and paternal grandmother; Thyroid  disease in her mother; Uterine cancer in her mother. There is no history of Colon cancer, Stomach cancer, Esophageal cancer, Rectal cancer, Liver cancer, Eczema, Urticaria, or Asthma. ROS:   Please see the history of present illness.    All 14 point review of systems negative except as described per history of present illness  EKGs/Labs/Other Studies Reviewed:    EKG Interpretation Date/Time:  Friday November 04 2023 10:22:00 EDT Ventricular Rate:  88 PR Interval:  160 QRS Duration:  80 QT Interval:  362 QTC Calculation: 438 R Axis:   82  Text Interpretation: Normal sinus rhythm Normal ECG When compared with ECG of 04-Nov-2022 15:38, No significant change was found Confirmed by Bernie Charleston [phone]) on 11/04/2023 10:41:44 AM    Recent Labs: 02/08/2023: ALT 15; Hemoglobin 13.0; Platelets 269 02/14/2023: BUN 25; Creat 0.98; Potassium 4.2; Sodium 140 03/28/2023: TSH 2.53  Recent Lipid Panel    Component Value Date/Time   CHOL 200 (H) 02/08/2023 0933   TRIG 93 02/08/2023 0933   HDL 92 02/08/2023 0933    CHOLHDL 2.2 02/08/2023 0933   VLDL 22 07/26/2016 0914   LDLCALC 89 02/08/2023 0933    Physical Exam:    VS:  BP 116/82   Pulse 88   Ht 5' 2 (1.575 m)   Wt 160 lb 3.2 oz (72.7 kg)   LMP  (LMP Unknown)   SpO2 98%   BMI 29.30 kg/m  Wt Readings from Last 3 Encounters:  11/04/23 160 lb 3.2 oz (72.7 kg)  04/01/23 154 lb 12.8 oz (70.2 kg)  02/14/23 150 lb (68 kg)     GEN:  Well nourished, well developed in no acute distress HEENT: Normal NECK: No JVD; No carotid bruits LYMPHATICS: No lymphadenopathy CARDIAC: RRR, no murmurs, no rubs, no gallops RESPIRATORY:  Clear to auscultation without rales, wheezing or rhonchi  ABDOMEN: Soft, non-tender, non-distended MUSCULOSKELETAL:  No edema; No deformity  SKIN: Warm and dry LOWER EXTREMITIES: no swelling NEUROLOGIC:  Alert and oriented x 3 PSYCHIATRIC:  Normal affect   ASSESSMENT:    1. Dilated cardiomyopathy (HCC)   2. Coronary artery disease of native artery of native heart with stable angina pectoris   3. Dyslipidemia    PLAN:    In order of problems listed above:  Coronary artery disease stable from that point review no PAD symptoms.  Continue exercises which I encouraged to do. History of dilated cardiomyopathy with normalization of left ventricle ejection fraction stable continue present management. Dyslipidemia I did review [facility] which show me data from January of this year with LDL of 89 HDL 92 we will continue present management Again, she exercise on regular basis which I strongly encouraged to continue   Medication Adjustments/Labs and Tests Ordered: Current medicines are reviewed at length with the patient today.  Concerns regarding medicines are outlined above.  Orders Placed This Encounter  Procedures   EKG 12-Lead   Medication changes: No orders of the defined types were placed in this encounter.   Signed, Lamar DOROTHA Fitch, MD, [facility] 11/04/2023 10:51 AM    [facility]

## 2023-11-04 NOTE — Patient Instructions (Signed)

## 2023-11-05 ENCOUNTER — Other Ambulatory Visit: Payer: Self-pay | Admitting: Cardiology

## 2023-11-08 ENCOUNTER — Other Ambulatory Visit: Payer: Self-pay | Admitting: Cardiology

## 2023-11-11 ENCOUNTER — Other Ambulatory Visit (HOSPITAL_COMMUNITY): Payer: Self-pay

## 2023-11-15 ENCOUNTER — Other Ambulatory Visit: Payer: Self-pay | Admitting: Medical Genetics

## 2023-11-19 ENCOUNTER — Other Ambulatory Visit: Payer: Self-pay | Admitting: Cardiology

## 2023-11-24 ENCOUNTER — Ambulatory Visit (HOSPITAL_COMMUNITY)
Admission: RE | Admit: 2023-11-24 | Discharge: 2023-11-24 | Disposition: A | Discharge status: Home / Self Care | Source: Ambulatory Visit | Attending: Nurse Practitioner | Admitting: Nurse Practitioner

## 2023-11-24 ENCOUNTER — Encounter (HOSPITAL_COMMUNITY): Payer: Self-pay

## 2023-11-24 VITALS — BP 131/86 | HR 98 | Temp 98.0°F | Resp 16

## 2023-11-24 DIAGNOSIS — R0981 Nasal congestion: Secondary | ICD-10-CM | POA: Diagnosis not present

## 2023-11-24 DIAGNOSIS — H9202 Otalgia, left ear: Secondary | ICD-10-CM | POA: Diagnosis not present

## 2023-11-24 DIAGNOSIS — Z8709 Personal history of other diseases of the respiratory system: Secondary | ICD-10-CM | POA: Diagnosis not present

## 2023-11-24 MED ORDER — PREDNISONE 20 MG PO TABS: 20.0000 mg | ORAL_TABLET | Freq: Two times a day (BID) | ORAL | 0 refills | Status: AC

## 2023-11-24 MED ORDER — AMOXICILLIN-POT CLAVULANATE 875-125 MG PO TABS: 1.0000 | ORAL_TABLET | Freq: Two times a day (BID) | ORAL | 0 refills | Status: DC

## 2023-11-24 NOTE — ED Provider Notes (Signed)
 [facility]    CSN: [id number] Arrival date & time: 11/24/23  1818      History   Chief Complaint Chief Complaint  Patient presents with   Ear Fullness    Ear pain...lower jaw/tooth pain (dental has been checked out no tooth or tooth nerve damage) - Entered by patient    HPI Rebecca Mccoy is a 58 y.o. female.   Patient presents for evaluation of continued left ear pain has been ongoing for greater than 1 month.  Patient originally felt that it may have been related to a root canal/crown-was seen by the endodontist who confirmed that everything looked okay.  The discomfort has been continuing over the past 2 weeks or more.  Now it is radiating around to her right ear.  She feels some posterior maxillary sinus discomfort which is similar to her previous sinusitis presentations.  No reported fever, overt headache, chest pain, shortness of breath, abdominal pain, vomiting, or diarrhea.  She has utilized DayQuil and sinus lavage to help manage the symptoms.  She also instilled Anbesol into her ear to help with the earache last night.  The history is provided by the patient.  Ear Fullness Pertinent negatives include no chest pain, no abdominal pain, no headaches and no shortness of breath.    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 08/14/2019   Coronary artery disease luminal based on cardiac cath from 2024 12/03/2022   Daytime somnolence 05/27/2022   Dilated cardiomyopathy (HCC) 02/04/2021   Dyslipidemia 07/03/2018   Fluid retention    GERD (gastroesophageal reflux disease)    GERD (gastroesophageal reflux disease)    Heart murmur    infant   High cholesterol    History of obesity 05/27/2022   Hx of adenomatous polyp of colon 07/09/2016   Sessile serrated polyp 11/17   Hypertension    Hypothyroidism  12/03/2015   Insomnia 07/30/2016   Iron deficiency anemia 11/14/2020   Irregular heart beat    Migraines    Non-allergic rhinitis 06/10/2017   OSA (obstructive sleep apnea) 10/04/2017   PID (pelvic inflammatory disease)    Polyphagia 05/27/2022   Prediabetes 10/03/2020   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   Recurrent upper respiratory infection (URI)    April 2019   Sleep apnea    Supraventricular tachycardia 08/22/2018   Thyroid  disease    Urticaria     Patient Active Problem List   Diagnosis Date Noted   Coronary artery disease luminal based on cardiac cath from 2024 12/03/2022   Abnormal stress test 11/18/2022   Anemia    Polyphagia 05/27/2022   History of obesity 05/27/2022   Daytime somnolence 05/27/2022   Dilated cardiomyopathy (HCC) 02/04/2021   Iron deficiency anemia 11/14/2020   Prediabetes 10/03/2020   Urticaria    Thyroid  disease    Sleep apnea    Recurrent upper respiratory infection (URI)    PID (pelvic inflammatory disease)    Migraines    Irregular heart beat    Hypertension    High cholesterol    Heart murmur    GERD (gastroesophageal reflux disease)    Fluid retention    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 08/14/2019   Supraventricular tachycardia 08/22/2018   Atypical chest pain  07/03/2018   Dyslipidemia 07/03/2018   OSA (obstructive sleep apnea) 10/04/2017   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   Non-allergic rhinitis 06/10/2017   Insomnia 07/30/2016   Hx of adenomatous polyp of colon 07/09/2016   Hypothyroidism 12/03/2015    Past Surgical History:  Procedure Laterality Date   AUGMENTATION MAMMAPLASTY Bilateral 2005   BREAST SURGERY  2005   Augmentation   CARPAL TUNNEL RELEASE Bilateral    R in '07 and L '06   COLONOSCOPY  2017   COSMETIC SURGERY     LEFT HEART CATH AND CORONARY ANGIOGRAPHY N/A 11/25/2022   Procedure: LEFT HEART CATH AND CORONARY  ANGIOGRAPHY;  Surgeon: Mady Bruckner, MD;  Location: [facility];  Service: Cardiovascular;  Laterality: N/A;   leg lift     TONSILLECTOMY  1972   TUBAL LIGATION  1989   tummy tuck  2005    OB History     Gravida  2   Para  2   Term  2   Preterm      AB  0   Living  2      SAB      IAB      Ectopic  0   Multiple      Live Births  2            Home Medications    Prior to Admission medications   Medication Sig Start Date End Date Taking? Authorizing Provider  amoxicillin -clavulanate (AUGMENTIN) 875-125 MG tablet Take 1 tablet by mouth every 12 (twelve) hours. 11/24/23  Yes Janet Therisa PARAS, FNP  predniSONE  (DELTASONE ) 20 MG tablet Take 1 tablet (20 mg total) by mouth 2 (two) times daily for 5 days. 11/24/23 11/29/23 Yes Janet Therisa PARAS, FNP  albuterol  (VENTOLIN  HFA) 108 (90 Base) MCG/ACT inhaler Inhale 1-2 puffs into the lungs every 6 (six) hours as needed for wheezing or shortness of breath. 01/13/21   Perri Ronal PARAS, MD  ALPRAZolam  (XANAX ) 0.25 MG tablet Take 1-2 tablets (0.25-0.5 mg total) by mouth at bedtime. 09/28/23   Perri Ronal PARAS, MD  aspirin  EC 81 MG tablet Take 1 tablet (81 mg total) by mouth daily. Swallow whole. 04/09/21   Krasowski, Robert J, MD  budesonide -formoterol  (SYMBICORT ) 160-4.5 MCG/ACT inhaler Inhale 2 puffs into the lungs 2 (two) times daily. Patient taking differently: Inhale 2 puffs into the lungs 2 (two) times daily as needed (shortness of breath). 01/13/21   Perri Ronal PARAS, MD  glucosamine-chondroitin 500-400 MG tablet Take 1 tablet by mouth daily.    [provider]  isosorbide  mononitrate (IMDUR ) 30 MG 24 hr tablet Take 1 tablet by mouth once daily 11/21/23   Krasowski, Robert J, MD  levothyroxine  (SYNTHROID ) 100 MCG tablet Take 1 tablet (100 mcg total) by mouth daily. 05/09/23   Webb, Padonda B, FNP  losartan  (COZAAR ) 25 MG tablet Take 1 tablet by mouth twice daily Patient taking differently: Take 25 mg by mouth daily.  04/25/23   Krasowski, Robert J, MD  MELATONIN PO Take 7.5 mg by mouth at bedtime.    [provider]  metoprolol  succinate (TOPROL -XL) 50 MG 24 hr tablet Take 0.5 tablets (25 mg total) by mouth daily. WITH FOOD 11/09/23   Bernie Lamar PARAS, MD  montelukast  (SINGULAIR ) 4 MG PACK Take 4 mg by mouth at bedtime.    [provider]  Multiple Vitamin (MULTIVITAMIN) capsule Take 1 capsule by mouth daily. Unknown strength    [provider]  nitroGLYCERIN  (NITROSTAT ) 0.4 MG SL tablet Place 1 tablet (0.4 mg total) under the tongue every 5 (five) minutes as needed for chest pain. 11/18/22   Krasowski, Robert J, MD  Omega-3 Fatty Acids (FISH OIL) 1000 MG CAPS Take 2 capsules by mouth daily.    [provider]  omeprazole (PRILOSEC) 20 MG capsule Take 20 mg by mouth daily.    [provider]  ranolazine  (RANEXA ) 500 MG 12 hr tablet Take 1 tablet by mouth twice daily 11/10/23   Krasowski, Robert J, MD  rosuvastatin  (CRESTOR ) 5 MG tablet TAKE 1 TABLET BY MOUTH THREE TIMES A WEEK 09/12/23   Perri Ronal PARAS, MD  semaglutide -weight management (WEGOVY ) 2.4 MG/0.75ML SOAJ SQ injection Inject 2.4 mg into the skin once a week. 10/13/23     Semaglutide -Weight Management (WEGOVY ) 2.4 MG/0.75ML SOAJ Inject 2.4 mg into the skin once a week. 04/07/23     triamterene -hydrochlorothiazide  (MAXZIDE -25) 37.5-25 MG tablet Take 0.5 tablets by mouth every other day. 05/16/23   Krasowski, Robert J, MD  triamterene -hydrochlorothiazide  (MAXZIDE -25) 37.5-25 MG tablet Take 1/2 tablet by mouth every morning. 07/29/23     TURMERIC PO Take 1,000 mg by mouth daily. Unknown strength    [provider]    Family History Family History  Problem Relation Age of Onset   Diabetes Mother    Uterine cancer Mother    Colon polyps Mother    Allergic rhinitis Mother    Hypertension Mother    Thyroid  disease Mother    Obesity Mother    Pancreatic cancer Father    Hyperlipidemia Father     Hypertension Father    Crohn's disease Cousin    Diabetes Maternal Grandmother    Stroke Maternal Grandmother    Diabetes Maternal Grandfather    Stroke Paternal Grandmother    Colon cancer Neg Hx    Stomach cancer Neg Hx    Esophageal cancer Neg Hx    Rectal cancer Neg Hx    Liver cancer Neg Hx    Eczema Neg Hx    Urticaria Neg Hx    Asthma Neg Hx     Social History Social History   Tobacco Use   Smoking status: Never   Smokeless tobacco: Never  Vaping Use   Vaping status: Never Used  Substance Use Topics   Alcohol use: Yes    Alcohol/week: 0.0 - 2.0 standard drinks of alcohol    Comment: 1-2 beer weekly   Drug use: No     Allergies   Patient has no known allergies.   Review of Systems Review of Systems  Constitutional:  Negative for chills and fever.  HENT:  Positive for congestion, sinus pressure and sore throat.   Eyes:  Negative for pain and redness.  Respiratory:  Negative for cough and shortness of breath.   Cardiovascular:  Negative for chest pain.  Gastrointestinal:  Negative for abdominal pain, diarrhea and vomiting.  Genitourinary:  Negative for dysuria and urgency.  Musculoskeletal:  Negative for back pain and myalgias.  Skin:  Negative for rash.  Neurological:  Negative for dizziness and headaches.  Psychiatric/Behavioral:  The patient is not nervous/anxious.      Physical Exam Triage Vital Signs ED Triage Vitals  Encounter Vitals Group     BP 11/24/23 1851 131/86     Girls Systolic BP Percentile --      Girls Diastolic BP Percentile --      Boys Systolic BP Percentile --      Boys  Diastolic BP Percentile --      Pulse Rate 11/24/23 1851 98     Resp 11/24/23 1851 16     Temp 11/24/23 1851 98 F (36.7 C)     Temp Source 11/24/23 1851 Oral     SpO2 11/24/23 1851 98 %     Weight --      Height --      Head Circumference --      Peak Flow --      Pain Score 11/24/23 1850 6     Pain Loc --      Pain Education --      Exclude from  Growth Chart --    No data found.  Updated Vital Signs BP 131/86 (BP Location: Left Arm)   Pulse 98   Temp 98 F (36.7 C) (Oral)   Resp 16   LMP  (LMP Unknown)   SpO2 98%   Physical Exam Vitals and nursing note reviewed.  Constitutional:      Appearance: Normal appearance.  HENT:     Head: Normocephalic.     Right Ear: There is impacted cerumen.     Left Ear: Ear canal and external ear normal. There is no impacted cerumen.     Ears:     Comments: Left tympanic membrane slightly bulging, non erythematous.    Nose: Nose normal. No congestion.     Mouth/Throat:     Mouth: Mucous membranes are moist.     Pharynx: No posterior oropharyngeal erythema.  Eyes:     Conjunctiva/sclera: Conjunctivae normal.     Pupils: Pupils are equal, round, and reactive to light.  Cardiovascular:     Rate and Rhythm: Normal rate and regular rhythm.     Heart sounds: No murmur heard. Pulmonary:     Effort: Pulmonary effort is normal.     Breath sounds: Normal breath sounds. No wheezing, rhonchi or rales.  Abdominal:     General: Bowel sounds are normal.  Musculoskeletal:     Cervical back: Normal range of motion and neck supple.  Skin:    General: Skin is warm and dry.  Neurological:     General: No focal deficit present.     Mental Status: She is alert and oriented to person, place, and time.  Psychiatric:        Mood and Affect: Mood normal.        Behavior: Behavior normal.        Thought Content: Thought content normal.        Judgment: Judgment normal.    UC Treatments / Results  Labs (all labs ordered are listed, but only abnormal results are displayed) Labs Reviewed - No data to display  EKG   Radiology No results found.  Procedures Procedures (including critical care time)  Medications Ordered in UC Medications - No data to display  Initial Impression / Assessment and Plan / UC Course  I have reviewed the triage vital signs and the nursing notes.  Pertinent labs  & imaging results that were available during my care of the patient were reviewed by me and considered in my medical decision making (see chart for details).    Patient presents for evaluation of bilateral ear discomfort, nasal congestion, sinus discomfort -symptom onset was over a month ago.  She had endodontist evaluation to ensure that it was not a root canal/crown issue.  No abnormalities reported.  Patient states that the discomfort is worsened over the past several days and  is refractory to OTC interventions.  Based upon duration of symptoms, similar to previous sinusitis presentations for her, and intensity-reasonable to provide antibiotic therapy at this time.  I have also prescribed a short low-dose steroid burst to help alleviate any existing eustachian tube dysfunction.  Discussed use of Zyrtec and Flonase.  May use Tylenol  as needed for discomfort.  If symptoms worsen or fail to improve, recommend return for reevaluation at that time.  Final Clinical Impressions(s) / UC Diagnoses   Final diagnoses:  Acute otalgia, left  Nasal congestion  History of sinusitis     Discharge Instructions      Based upon your history of sinusitis with similar presentation, and that your symptoms have been ongoing for over one month, reasonable to provide antibiotic therapy at this time. A short low dose prednisone  burst has been provided to help alleviate any eustachian tube dysfunction. May use Zyrtec and Flonase to help with symptom management.     ED Prescriptions     Medication Sig Dispense Auth. Provider   amoxicillin -clavulanate (AUGMENTIN) 875-125 MG tablet Take 1 tablet by mouth every 12 (twelve) hours. 14 tablet Janet Therisa PARAS, FNP   predniSONE  (DELTASONE ) 20 MG tablet Take 1 tablet (20 mg total) by mouth 2 (two) times daily for 5 days. 10 tablet Janet Therisa PARAS, FNP      PDMP not reviewed this encounter.   Janet Therisa PARAS, FNP 11/24/23 [phone]

## 2023-11-24 NOTE — ED Triage Notes (Signed)
 Pt c/o lt jaw pain for a month. States mainly at night. Saw her dentist and states everything was good.   Pt c/o lt ear pain x2 days. States taking OTC meds with no relief.

## 2023-11-24 NOTE — Discharge Instructions (Addendum)
 Based upon your history of sinusitis with similar presentation, and that your symptoms have been ongoing for over one month, reasonable to provide antibiotic therapy at this time. A short low dose prednisone  burst has been provided to help alleviate any eustachian tube dysfunction. May use Zyrtec and Flonase to help with symptom management.

## 2023-11-25 ENCOUNTER — Other Ambulatory Visit (HOSPITAL_COMMUNITY): Payer: Self-pay

## 2023-11-25 ENCOUNTER — Other Ambulatory Visit: Payer: Self-pay | Admitting: Internal Medicine

## 2023-11-25 MED ORDER — ALPRAZOLAM 0.25 MG PO TABS
0.2500 mg | ORAL_TABLET | Freq: Every evening | ORAL | 0 refills | Status: DC
  Filled 2023-11-25: qty 60, 30d supply, fill #0

## 2023-11-27 ENCOUNTER — Other Ambulatory Visit: Payer: Self-pay | Admitting: Internal Medicine

## 2023-12-01 ENCOUNTER — Ambulatory Visit: Admitting: Internal Medicine

## 2023-12-01 VITALS — BP 120/80 | HR 97 | Temp 98.3°F | Ht 62.0 in | Wt 160.0 lb

## 2023-12-01 DIAGNOSIS — H6502 Acute serous otitis media, left ear: Secondary | ICD-10-CM | POA: Diagnosis not present

## 2023-12-01 DIAGNOSIS — H9202 Otalgia, left ear: Secondary | ICD-10-CM

## 2023-12-01 MED ORDER — CEFTRIAXONE SODIUM 1 G IJ SOLR
1.0000 g | Freq: Once | INTRAMUSCULAR | Status: AC
  Administered 2023-12-01: 1 g via INTRAMUSCULAR

## 2023-12-01 MED ORDER — FLUCONAZOLE 150 MG PO TABS: 150.0000 mg | ORAL_TABLET | Freq: Once | ORAL | 1 refills | Status: AC

## 2023-12-01 MED ORDER — LEVOFLOXACIN 500 MG PO TABS: 500.0000 mg | ORAL_TABLET | Freq: Every day | ORAL | 1 refills | Status: AC

## 2023-12-01 NOTE — Progress Notes (Signed)
 Patient Care Team: Perri Ronal PARAS, MD as PCP - General (Internal Medicine)  Visit Date: 12/01/23  Subjective:    Patient ID: Rebecca Mccoy , Female   DOB: Nov 19, 1965, 58 y.o.    MRN: [id number]   58 y.o. Female presents today for  left ear pain. Patient has a past medical history of Hypertension, OSA, GE Reflux.   She is experiencing pain in her left ear. She was seen in urgent care on 11/24/2023. She originally felt that it may have been related to a root canal/crown but was seen by the endodontist who confirmed that everything looked okay. She said that the pain started in her ear and radiated throughout her jaw. She was prescribed Augmentin  875-125 mg and prednisone  20 mg twice daily. She said that the pain has improved but ear discomfort has not completely resolved.   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 08/14/2019   Coronary artery disease luminal based on cardiac cath from 2024 12/03/2022   Daytime somnolence 05/27/2022   Dilated cardiomyopathy (HCC) 02/04/2021   Dyslipidemia 07/03/2018   Fluid retention    GERD (gastroesophageal reflux disease)    GERD (gastroesophageal reflux disease)    Heart murmur    infant   High cholesterol    History of obesity 05/27/2022   Hx of adenomatous polyp of colon 07/09/2016   Sessile serrated polyp 11/17   Hypertension    Hypothyroidism 12/03/2015   Insomnia 07/30/2016   Iron deficiency anemia 11/14/2020   Irregular heart beat    Migraines    Non-allergic rhinitis 06/10/2017   OSA (obstructive sleep apnea) 10/04/2017   PID (pelvic inflammatory disease)    Polyphagia 05/27/2022   Prediabetes 10/03/2020   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   Recurrent upper respiratory infection (URI)    April 2019   Sleep apnea    Supraventricular tachycardia  08/22/2018   Thyroid  disease    Urticaria      Family History  Problem Relation Age of Onset   Diabetes Mother    Uterine cancer Mother    Colon polyps Mother    Allergic rhinitis Mother    Hypertension Mother    Thyroid  disease Mother    Obesity Mother    Pancreatic cancer Father    Hyperlipidemia Father    Hypertension Father    Crohn's disease Cousin    Diabetes Maternal Grandmother    Stroke Maternal Grandmother    Diabetes Maternal Grandfather    Stroke Paternal Grandmother    Colon cancer Neg Hx    Stomach cancer Neg Hx    Esophageal cancer Neg Hx    Rectal cancer Neg Hx    Liver cancer Neg Hx    Eczema Neg Hx    Urticaria Neg Hx    Asthma Neg Hx     Social History   Social History Narrative   Married twice. Divorced once. First husband was with the [employer]. 1 adult daughter from that marriage. Current husband retired from [employer]. She completed 2 years of college. She formerly worked for [employer]. Social alcohol consumption.      Review of Systems  HENT:  Positive for ear pain.         Objective:   Vitals: BP 120/80   Pulse  97   Temp 98.3 F (36.8 C)   Ht 5' 2 (1.575 m)   Wt 160 lb (72.6 kg)   LMP  (LMP Unknown)   SpO2 96%   BMI 29.26 kg/m    Physical Exam Vitals and nursing note reviewed.  Constitutional:      General: She is not in acute distress.    Appearance: Normal appearance. She is not ill-appearing.  HENT:     Head: Normocephalic and atraumatic.     Right Ear: Tympanic membrane, ear canal and external ear normal.     Left Ear: Tympanic membrane, ear canal and external ear normal.     Ears:     Comments: Cerumen in left ear. Left tympanic membrane is full with a  splayed light reflex.     Mouth/Throat:     Mouth: Mucous membranes are moist.     Pharynx: Oropharynx is clear. No oropharyngeal exudate or posterior oropharyngeal erythema.  Neck:     Thyroid : No thyroid  mass, thyromegaly or thyroid   tenderness.  Pulmonary:     Effort: Pulmonary effort is normal.     Breath sounds: Normal breath sounds. No wheezing, rhonchi or rales.  Lymphadenopathy:     Cervical: No cervical adenopathy.  Skin:    General: Skin is warm and dry.  Neurological:     Mental Status: She is alert and oriented to person, place, and time. Mental status is at baseline.  Psychiatric:        Mood and Affect: Mood normal.        Behavior: Behavior normal.        Thought Content: Thought content normal.        Judgment: Judgment normal.       Results:     Labs:       Component Value Date/Time   NA 140 02/14/2023 1545   NA 141 11/18/2022 1625   K 4.2 02/14/2023 1545   CL 102 02/14/2023 1545   CO2 31 02/14/2023 1545   GLUCOSE 74 02/14/2023 1545   BUN 25 02/14/2023 1545   BUN 16 11/18/2022 1625   CREATININE 0.98 02/14/2023 1545   CALCIUM  9.9 02/14/2023 1545   PROT 7.2 02/08/2023 0933   PROT 7.5 07/01/2020 1741   ALBUMIN 4.9 02/23/2022 0000   ALBUMIN 4.9 07/01/2020 1741   AST 26 02/08/2023 0933   ALT 15 02/08/2023 0933   ALKPHOS 60 02/23/2022 0000   BILITOT 0.9 02/08/2023 0933   BILITOT 1.0 07/01/2020 1741   GFRNONAA 64 06/22/2019 0915   GFRAA 74 06/22/2019 0915     Lab Results  Component Value Date   WBC 4.1 02/08/2023   HGB 13.0 02/08/2023   HCT 39.4 02/08/2023   MCV 99.2 02/08/2023   PLT 269 02/08/2023    Lab Results  Component Value Date   CHOL 200 (H) 02/08/2023   HDL 92 02/08/2023   LDLCALC 89 02/08/2023   TRIG 93 02/08/2023   CHOLHDL 2.2 02/08/2023    Lab Results  Component Value Date   HGBA1C 5.0 02/08/2023     Lab Results  Component Value Date   TSH 2.53 03/28/2023        Assessment & Plan:   Meds ordered this encounter  Medications   fluconazole  (DIFLUCAN ) 150 MG tablet    Sig: Take 1 tablet (150 mg total) by mouth once for 1 dose.    Dispense:  1 tablet    Refill:  1   levofloxacin  (LEVAQUIN ) 500 MG tablet  Sig: Take 1 tablet (500 mg total) by  mouth daily for 7 days.    Dispense:  7 tablet    Refill:  1    Left Serous Otitis Media: She is experiencing pain in her left ear. She was seen in urgent care on 11/24/2023. She originally felt that it may have been related to a root canal/crown but was seen by the endodontist who confirmed that everything looked okay. She said that the pain started in her ear and radiated throughout her jaw. She was prescribed Augmentin  875-125 mg and prednisone  20 mg twice daily. She said that the pain has improved but that it has not completely gone away.    Rocephin  1 g IM injection received today. In office  Levaquin  500 mg daily prescribed for 7 days  Diflucan  150 mg prescribed if needed for Candida vaginitis due to antibiotic treatment   I,Makayla C Reid,acting as a scribe for Ronal JINNY Hailstone, MD.,have documented all relevant documentation on the behalf of Ronal JINNY Hailstone, MD,as directed by  Ronal JINNY Hailstone, MD while in the presence of Ronal JINNY Hailstone, MD.  I, Ronal JINNY Hailstone, MD, have reviewed all documentation for this visit. The documentation on 12/01/2023 for the exam, diagnosis, procedures, and orders are all accurate and complete.

## 2023-12-07 ENCOUNTER — Other Ambulatory Visit: Payer: Self-pay

## 2023-12-07 ENCOUNTER — Other Ambulatory Visit: Payer: Self-pay | Admitting: Internal Medicine

## 2023-12-08 ENCOUNTER — Encounter (HOSPITAL_COMMUNITY): Payer: Self-pay

## 2023-12-08 ENCOUNTER — Other Ambulatory Visit (HOSPITAL_COMMUNITY): Payer: Self-pay

## 2023-12-11 NOTE — Patient Instructions (Signed)
 You have been diagnosed with acute left serous otitis media.  1 g IM Rocephin  given in the office today.  Take Levaquin  500 milligrams daily for 7 days.  May take Diflucan  150 mg tablet one-time dose if needed for Candida vaginitis due to antibiotic treatment.  Please advise if not improving in 7 to 10 days or sooner if worse.

## 2023-12-20 ENCOUNTER — Other Ambulatory Visit

## 2023-12-29 NOTE — Telephone Encounter (Signed)
 done

## 2024-01-04 ENCOUNTER — Other Ambulatory Visit: Payer: Self-pay | Admitting: Internal Medicine

## 2024-01-04 ENCOUNTER — Other Ambulatory Visit (HOSPITAL_COMMUNITY): Payer: Self-pay

## 2024-01-05 ENCOUNTER — Other Ambulatory Visit: Payer: Self-pay

## 2024-01-05 ENCOUNTER — Other Ambulatory Visit (HOSPITAL_COMMUNITY): Payer: Self-pay

## 2024-01-05 MED ORDER — ALPRAZOLAM 0.25 MG PO TABS
0.2500 mg | ORAL_TABLET | Freq: Every evening | ORAL | 2 refills | Status: AC
  Filled 2024-01-05: qty 60, 30d supply, fill #0
  Filled 2024-02-08: qty 60, 30d supply, fill #1

## 2024-01-05 MED ORDER — WEGOVY 2.4 MG/0.75ML ~~LOC~~ SOAJ
2.4000 mg | SUBCUTANEOUS | 0 refills | Status: AC
  Filled 2024-01-05: qty 3, 28d supply, fill #0
  Filled 2024-02-08: qty 3, 28d supply, fill #1

## 2024-01-25 ENCOUNTER — Other Ambulatory Visit: Payer: Self-pay | Admitting: Family

## 2024-02-03 NOTE — Progress Notes (Signed)
 "  Annual Comprehensive Physical Exam   Patient Care Team: Perri Ronal PARAS, MD as PCP - General (Internal Medicine)  Visit Date: 02/17/24   Chief Complaint  Patient presents with   Annual Exam   Subjective:  Patient: Rebecca Mccoy, Female DOB: [birth year], 59 y.o. MRN: [id number] Vitals:   02/17/24 1100  BP: 120/82   Rebecca Mccoy is a 59 y.o. Female who presents today for her Annual Comprehensive Physical Exam. 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  History of Hypertension treated with 25 mg Losartan  BID, 25 mg Metoprolol  succinate daily, Imdur  30 mg daily, and 0.4 mg Nitrostat  as needed.  Being followed by Lamar Fitch with cardiology,    History of Hyperlipidemia treated with 5 mg Rosuvastatin  3x/week - can not tolerate daily dosing.     History of Hypothyroidism treated with 88 mcg Levothyroxine . 02/14/2024 TSH 2.10.       History of Anxiety treated with Xanax  0.25 mg at bedtime.   History of Obesity, previously [facility]. Is on 2.4 gm Wegovy  weekly.  Previously tried Mounjaro .  Current weight 159, BMI 29.08.  She has gained 9 pounds since last visit in 2025    History of Obstructive Sleep Apnea  managed with sleep assistance apparatus 6-7 hours a night. Seeing sleep disorder physician through [facility].  Bone Density won't be due until she is 59 y.o.    Labs 02/14/2024  TSH 2.10, HgbA1c 5.1%.   02/14/2023 Pap smear Negative of intraepithelial lesion or malignancy.   10/17/2023 Mammogram No mammographic evidence of malignancy in EITHER breast. The reported palpable abnormality resolved 2 weeks ago. The patient is asymptomatic today.   11/28/2020 Colonoscopy The entire examined colon is normal on direct and retroflexion views. No polyps or cancers. Repeat in 10 years.  Health Maintenance  Topic Date Due   Influenza Vaccine  04/17/2024 (Originally 08/19/2023)   Pneumococcal Vaccine: 50+ Years (1 of 2 - PCV) 02/16/2025 (Originally 06/27/1984)   Hepatitis B Vaccines 19-59 Average Risk (1 of 3 - 19+ 3-dose series) 02/16/2025 (Originally 06/27/1984)   Mammogram  10/16/2025   Cervical Cancer Screening (HPV/Pap Cotest)  02/13/2026   DTaP/Tdap/Td (2 - Td or Tdap) 12/11/2026   Colonoscopy  11/29/2030   HPV VACCINES (No Doses Required) Completed   Zoster Vaccines- Shingrix  Completed   Meningococcal B Vaccine  Aged Out   COVID-19 Vaccine  Discontinued   Hepatitis C Screening  Discontinued   HIV Screening  Discontinued    Review of Systems  Constitutional:  Negative for fever and malaise/fatigue.  HENT:  Negative for congestion.   Eyes:  Negative for blurred vision.  Respiratory:  Negative for cough and shortness of breath.   Cardiovascular:  Negative for chest pain, palpitations and leg swelling.  Gastrointestinal:  Negative for vomiting.  Musculoskeletal:  Negative for back pain.  Skin:  Negative for rash.  Neurological:  Negative for loss of  consciousness and headaches.   Objective:  Vitals: body mass index is 29.08 kg/m. Today's Vitals   02/17/24 1100  BP: 120/82  Pulse: 90  Temp: 98.7 F (37.1 C)  TempSrc: Tympanic  SpO2: 98%  Weight: 159 lb (72.1 kg)  Height:  5' 2 (1.575 m)   Physical Exam Vitals and nursing note reviewed.  Constitutional:      General: She is not in acute distress.    Appearance: Normal appearance. She is not ill-appearing or toxic-appearing.  HENT:     Head: Normocephalic and atraumatic.     Right Ear: Hearing, tympanic membrane, ear canal and external ear normal.     Left Ear: Hearing, tympanic membrane, ear canal and external ear normal.     Mouth/Throat:     Pharynx: Oropharynx is clear.  Eyes:     Extraocular Movements: Extraocular movements intact.     Pupils: Pupils are equal, round, and reactive to light.  Neck:     Thyroid : No thyroid  mass, thyromegaly or thyroid  tenderness.     Vascular: No carotid bruit.  Cardiovascular:     Rate and Rhythm: Normal rate and regular rhythm. No extrasystoles are present.    Pulses:          Dorsalis pedis pulses are 2+ on the right side and 2+ on the left side.     Heart sounds: Normal heart sounds. No murmur heard.    No friction rub. No gallop.  Pulmonary:     Effort: Pulmonary effort is normal.     Breath sounds: Normal breath sounds. No decreased breath sounds, wheezing, rhonchi or rales.  Chest:     Chest wall: No mass.  Abdominal:     Palpations: Abdomen is soft. There is no hepatomegaly, splenomegaly or mass.     Tenderness: There is no abdominal tenderness.     Hernia: No hernia is present.  Musculoskeletal:     Cervical back: Normal range of motion.     Right lower leg: No edema.     Left lower leg: No edema.  Lymphadenopathy:     Cervical: No cervical adenopathy.     Upper Body:     Right upper body: No supraclavicular adenopathy.     Left upper body: No supraclavicular adenopathy.  Skin:    General: Skin is warm and dry.  Neurological:     General: No focal deficit present.     Mental Status: She is alert and oriented to person, place, and time. Mental status is at baseline.     Sensory: Sensation is intact.     Motor: Motor function is intact. No  weakness.     Deep Tendon Reflexes: Reflexes are normal and symmetric.  Psychiatric:        Attention and Perception: Attention normal.        Mood and Affect: Mood normal.        Speech: Speech normal.        Behavior: Behavior normal.        Thought Content: Thought content normal.        Cognition and Memory: Cognition normal.        Judgment: Judgment normal.     Current Outpatient Medications  Medication Instructions   albuterol  (VENTOLIN  HFA) 108 (90 Base) MCG/ACT inhaler 1-2 puffs, Inhalation, Every 6 hours PRN   ALPRAZolam  (XANAX ) 0.25-0.5 mg, Oral, Nightly   amoxicillin -clavulanate (AUGMENTIN ) 875-125 MG tablet 1 tablet, Oral, Every 12 hours   aspirin  EC 81 mg,  Oral, Daily, Swallow whole.   budesonide -formoterol  (SYMBICORT ) 160-4.5 MCG/ACT inhaler 2 puffs, Inhalation, 2 times daily   glucosamine-chondroitin 500-400 MG tablet 1 tablet, Daily   isosorbide  mononitrate (IMDUR ) 30 mg, Oral, Daily   levothyroxine  (SYNTHROID ) 100 mcg, Oral, Daily   losartan  (COZAAR ) 25 mg, Oral, 2 times daily   MELATONIN PO 7.5 mg, Daily at bedtime   metoprolol  succinate (TOPROL -XL) 25 mg, Oral, Daily, WITH FOOD   Multiple Vitamin (MULTIVITAMIN) capsule 1 capsule, Daily   nitroGLYCERIN  (NITROSTAT ) 0.4 mg, Sublingual, Every 5 min PRN   Omega-3 Fatty Acids (FISH OIL) 1000 MG CAPS 2 capsules, Daily   omeprazole (PRILOSEC) 20 mg, Daily   ranolazine  (RANEXA ) 500 mg, Oral, 2 times daily   rosuvastatin  (CRESTOR ) 5 MG tablet TAKE 1 TABLET BY MOUTH THREE TIMES A WEEK   triamterene -hydrochlorothiazide  (MAXZIDE -25) 37.5-25 MG tablet 0.5 tablets, Oral, Every other day   triamterene -hydrochlorothiazide  (MAXZIDE -25) 37.5-25 MG tablet Take 1/2 tablet by mouth every morning.   TURMERIC PO 1,000 mg, Daily   Wegovy  2.4 mg, Subcutaneous, Weekly   Wegovy  2.4 mg, Subcutaneous, Weekly   Past Medical History:  Diagnosis Date   Abnormal stress test 11/18/2022   Anemia    Atypical chest pain 07/03/2018   Class 1  drug-induced obesity with body mass index (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 08/14/2019   Coronary artery disease luminal based on cardiac cath from 2024 12/03/2022   Daytime somnolence 05/27/2022   Dilated cardiomyopathy (HCC) 02/04/2021   Dyslipidemia 07/03/2018   Fluid retention    GERD (gastroesophageal reflux disease)    GERD (gastroesophageal reflux disease)    Heart murmur    infant   High cholesterol    History of obesity 05/27/2022   Hx of adenomatous polyp of colon 07/09/2016   Sessile serrated polyp 11/17   Hypertension    Hypothyroidism 12/03/2015   Insomnia 07/30/2016   Iron deficiency anemia 11/14/2020   Irregular heart beat    Migraines    Non-allergic rhinitis 06/10/2017   OSA (obstructive sleep apnea) 10/04/2017   PID (pelvic inflammatory disease)    Polyphagia 05/27/2022   Prediabetes 10/03/2020   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   Recurrent upper respiratory infection (URI)    April 2019   Sleep apnea    Supraventricular tachycardia 08/22/2018   Thyroid  disease    Urticaria    Medical/Surgical History Narrative:  Allergic/Intolerant to: Allergies[1]  Had COVID-19 in the Fall 2021 and again in January 2023.   Did have some issues with iron deficiency in 2022.     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   History of chondromalacia right knee in 2014.  Cellulitis right thigh 2014.  History of right distal phalanx second toe fracture.  History of bacterial vaginosis and Candida vaginitis.   Had negative exercise tolerance test in 2018 for chest pain.   Sclerotherapy for varicose veins in 2004.   History of allergic rhinitis, GE reflux, history of bilateral carpal tunnel syndrome status postsurgical release.  History of hypertension.  History of menorrhagia due to fibroids in 2006.   IUD inserted in 2008.  Sebaceous cyst on her back removed 2008.  History of syncope in 2008 referred for echocardiogram and event monitor.   Had no subsequent issues with syncope.  Tonsillectomy 1972.  Bilateral tubal ligation 1979.  Abdominoplasty 2008.  Breast augmentation 2010.  Right carpal tunnel release February 2010.  Left carpal tunnel release March 2011.  Mirena IUD placed 2013. History of  anxiety disorder previously seen at [facility] [place] [employer] [place] and which is currently stable and not an issue.  History of palpitations. In 2020 she was referred to cardiology for substernal chest pain with history of tachycardia in the past.  Holter monitor for 5 days showed infrequent PVCs and infrequent premature supraventricular beats with 3 runs of narrow complex tachycardia.  Longest episode was 13 beats at 162 bpm.  Had normal gated myocardial perfusion study in 2020.  Had coronary calcium  scoring in January 2023.  Total score was 108. History of SVT seen by Dr. Bernie. Thinks metoprolol  is causing weight gain.  This was started in August 2020 by cardiologist for tachycardia and atypical chest pain. Past Surgical History:  Procedure Laterality Date   AUGMENTATION MAMMAPLASTY Bilateral 2005   BREAST SURGERY  2005   Augmentation   CARPAL TUNNEL RELEASE Bilateral    R in '07 and L '06   COLONOSCOPY  2017   COSMETIC SURGERY     LEFT HEART CATH AND CORONARY ANGIOGRAPHY N/A 11/25/2022   Procedure: LEFT HEART CATH AND CORONARY ANGIOGRAPHY;  Surgeon: Mady Bruckner, MD;  Location: [facility] INV[facility] CV [facility];  Service: Cardiovascular;  Laterality: N/A;   leg lift     TONSILLECTOMY  1972   TUBAL LIGATION  1989   tummy tuck  2005   Family History  Problem Relation Age of Onset   Diabetes Mother    Uterine cancer Mother    Colon polyps Mother    Allergic rhinitis Mother    Hypertension Mother    Thyroid  disease Mother    Obesity Mother    Pancreatic cancer Father    Hyperlipidemia Father    Hypertension Father    Crohn's disease Cousin    Diabetes Maternal Grandmother    Stroke Maternal Grandmother     Diabetes Maternal Grandfather    Stroke Paternal Grandmother    Colon cancer Neg Hx    Stomach cancer Neg Hx    Esophageal cancer Neg Hx    Rectal cancer Neg Hx    Liver cancer Neg Hx    Eczema Neg Hx    Urticaria Neg Hx    Asthma Neg Hx     Social History   Social History Narrative   Married twice. Divorced once. First husband was with the [employer]. 1 adult daughter from that marriage. Current husband retired from [employer]. She completed 2 years of college. She formerly worked for [employer]. Social alcohol consumption.   Most Recent Health Risks Assessment:   Most Recent Social Determinants of Health (Including Hx of Tobacco, Alcohol, and Drug Use) SDOH Screenings   Food Insecurity: No Food Insecurity (12/01/2023)  Housing: Low Risk (12/01/2023)  Transportation Needs: No Transportation Needs (12/01/2023)  Alcohol Screen: Low Risk (12/01/2023)  Depression (PHQ2-9): Low Risk (02/17/2024)  Financial Resource Strain: Low Risk (12/01/2023)  Physical Activity: Insufficiently Active (12/01/2023)  Social Connections: Socially Integrated (12/01/2023)  Stress: No Stress Concern Present (12/01/2023)  Tobacco Use: Low Risk (02/17/2024)   Social History[2]   Most Recent Fall Risk Assessment:    02/17/2024   11:00 AM  Fall Risk   Falls in the past year? 0  Number falls in past yr: 0  Injury with Fall? 0  Risk for fall due to : No Fall Risks  Follow up Falls evaluation completed   Most Recent Anxiety/Depression Screenings:    02/17/2024   11:06 AM 12/01/2023    4:48  PM  PHQ 2/9 Scores  PHQ - 2 Score 0 0    Results:  Studies Obtained And Personally Reviewed By Me:   02/14/2023 Pap smear Negative of intraepithelial lesion or malignancy.   10/17/2023 Mammogram No mammographic evidence of malignancy in EITHER breast. The reported palpable abnormality resolved 2 weeks ago. The patient is asymptomatic today.   11/28/2020 Colonoscopy The entire  examined colon is normal on direct and retroflexion views. No polyps or cancers. Repeat in 10 years.   Labs:  CBC w/ Differential Lab Results  Component Value Date   WBC 4.1 02/08/2023   RBC 3.97 02/08/2023   HGB 13.0 02/08/2023   HCT 39.4 02/08/2023   PLT 269 02/08/2023   MCV 99.2 02/08/2023   MCH 32.7 02/08/2023   MCHC 33.0 02/08/2023   RDW 12.7 02/08/2023   MPV 10.0 02/08/2023   LYMPHSABS 1,676 09/25/2021   MONOABS 0.4 07/28/2016   BASOSABS 29 02/08/2023    Comprehensive Metabolic Panel Lab Results  Component Value Date   NA 140 02/14/2023   K 4.2 02/14/2023   CL 102 02/14/2023   CO2 31 02/14/2023   GLUCOSE 74 02/14/2023   BUN 25 02/14/2023   CREATININE 0.98 02/14/2023   CALCIUM  9.9 02/14/2023   PROT 7.2 02/08/2023   ALBUMIN 4.9 02/23/2022   AST 26 02/08/2023   ALT 15 02/08/2023   ALKPHOS 60 02/23/2022   BILITOT 0.9 02/08/2023   GFR 68.38 07/28/2016   EGFR 61 02/08/2023   GFRNONAA 64 06/22/2019   Lipid Panel  Lab Results  Component Value Date   CHOL 200 (H) 02/08/2023   HDL 92 02/08/2023   LDLCALC 89 02/08/2023   TRIG 93 02/08/2023   A1c Lab Results  Component Value Date   HGBA1C 5.1 02/14/2024    TSH Lab Results  Component Value Date   TSH 2.10 02/14/2024    Assessment & Plan:   Orders Placed This Encounter  Procedures   Comprehensive metabolic panel with GFR   CBC with Differential   Lipid Panel   POCT URINALYSIS DIP (CLINITEK)   Hypertension: treated with 25 mg Losartan  BID, 25 mg Metoprolol  succinate daily, Imdur  30 mg daily, and 0.4 mg Nitrostat  as needed.  Being followed by Lamar Fitch with cardiology,    Hyperlipidemia: treated with 5 mg Rosuvastatin  3x/week - can not tolerate daily dosing.    Lipid panel collected.      Hypothyroidism: treated with 88 mcg Levothyroxine . 02/14/2024 TSH 2.10.       Anxiety: treated with Xanax  0.25 mg at bedtime.   Obesity: previously attended [facility]. Is on 2.4 gm Wegovy   weekly.  Previously tried Mounjaro .  Current weight 159, BMI 29.08.  She has gained 9 pounds since last visit in 2025    Obstructive Sleep Apnea: managed with sleep assistance apparatus 6-7 hours a night. Seeing sleep disorder physician through [facility].  Bone Density won't be due until she is 59 y.o.    02/14/2023 Pap smear Negative of intraepithelial lesion or malignancy.   10/17/2023 Mammogram No mammographic evidence of malignancy in EITHER breast. The reported palpable abnormality resolved 2 weeks ago. The patient is asymptomatic today.   11/28/2020 Colonoscopy The entire examined colon is normal on direct and retroflexion views. No polyps or cancers. Repeat in 10 years.    Annual Comprehensive Physical Exam done today including the all of the following: Reviewed patient's Family Medical History Reviewed patient's SDOH and reviewed tobacco, alcohol, and drug use.  Reviewed  and updated list of patient's medical providers Assessment of cognitive impairment was done Assessed patient's functional ability Established a written schedule for health screening services Health Risk Assessent Completed and Reviewed  Discussed health benefits of physical activity, and encouraged her to engage in regular exercise appropriate for her age and condition.   I,Makayla C Reid,acting as a scribe for Ronal JINNY Hailstone, MD.,have documented all relevant documentation on the behalf of Ronal JINNY Hailstone, MD,as directed by  Ronal JINNY Hailstone, MD while in the presence of Ronal JINNY Hailstone, MD.  I, Ronal JINNY Hailstone, MD, have reviewed all documentation for and agree with the above Annual Wellness Visit documentation.  Ronal JINNY Hailstone, MD Internal Medicine 02/17/2024     [1] No Known Allergies [2]  Social History Tobacco Use   Smoking status: Never   Smokeless tobacco: Never  Vaping Use   Vaping status: Never Used  Substance Use Topics   Alcohol use: Yes    Alcohol/week: 0.0 - 2.0 standard drinks of alcohol     Comment: 1-2 beer weekly   Drug use: No   "

## 2024-02-08 ENCOUNTER — Other Ambulatory Visit: Payer: Self-pay

## 2024-02-13 ENCOUNTER — Other Ambulatory Visit (HOSPITAL_COMMUNITY)

## 2024-02-13 ENCOUNTER — Other Ambulatory Visit: Payer: 59

## 2024-02-14 ENCOUNTER — Other Ambulatory Visit

## 2024-02-14 DIAGNOSIS — E039 Hypothyroidism, unspecified: Secondary | ICD-10-CM

## 2024-02-14 DIAGNOSIS — R7302 Impaired glucose tolerance (oral): Secondary | ICD-10-CM

## 2024-02-15 LAB — HEMOGLOBIN A1C
Hgb A1c MFr Bld: 5.1 %
Mean Plasma Glucose: 100 mg/dL
eAG (mmol/L): 5.5 mmol/L

## 2024-02-15 LAB — TSH: TSH: 2.1 m[IU]/L (ref 0.40–4.50)

## 2024-02-17 ENCOUNTER — Ambulatory Visit: Payer: 59 | Admitting: Internal Medicine

## 2024-02-17 ENCOUNTER — Encounter: Payer: Self-pay | Admitting: Internal Medicine

## 2024-02-17 VITALS — BP 120/82 | HR 90 | Temp 98.7°F | Ht 62.0 in | Wt 159.0 lb

## 2024-02-17 DIAGNOSIS — E669 Obesity, unspecified: Secondary | ICD-10-CM | POA: Diagnosis not present

## 2024-02-17 DIAGNOSIS — E039 Hypothyroidism, unspecified: Secondary | ICD-10-CM | POA: Diagnosis not present

## 2024-02-17 DIAGNOSIS — E782 Mixed hyperlipidemia: Secondary | ICD-10-CM

## 2024-02-17 DIAGNOSIS — E785 Hyperlipidemia, unspecified: Secondary | ICD-10-CM | POA: Diagnosis not present

## 2024-02-17 DIAGNOSIS — Z6829 Body mass index (BMI) 29.0-29.9, adult: Secondary | ICD-10-CM

## 2024-02-17 DIAGNOSIS — Z0001 Encounter for general adult medical examination with abnormal findings: Secondary | ICD-10-CM | POA: Diagnosis not present

## 2024-02-17 DIAGNOSIS — I10 Essential (primary) hypertension: Secondary | ICD-10-CM

## 2024-02-17 DIAGNOSIS — G4733 Obstructive sleep apnea (adult) (pediatric): Secondary | ICD-10-CM

## 2024-02-17 DIAGNOSIS — Z0000 Encounter for general adult medical examination without abnormal findings: Secondary | ICD-10-CM

## 2024-02-17 DIAGNOSIS — F419 Anxiety disorder, unspecified: Secondary | ICD-10-CM

## 2024-02-17 LAB — POCT URINALYSIS DIP (CLINITEK)
Bilirubin, UA: NEGATIVE
Glucose, UA: NEGATIVE mg/dL
Ketones, POC UA: NEGATIVE mg/dL
Leukocytes, UA: NEGATIVE
Nitrite, UA: NEGATIVE
Spec Grav, UA: 1.01
Urobilinogen, UA: 0.2 U/dL
pH, UA: 8

## 2024-02-17 LAB — COMPREHENSIVE METABOLIC PANEL WITH GFR
AG Ratio: 1.8 (calc) (ref 1.0–2.5)
ALT: 14 U/L (ref 6–29)
AST: 17 U/L (ref 10–35)
Albumin: 4.7 g/dL (ref 3.6–5.1)
Alkaline phosphatase (APISO): 48 U/L (ref 37–153)
BUN: 18 mg/dL (ref 7–25)
CO2: 32 mmol/L (ref 20–32)
Calcium: 10.3 mg/dL (ref 8.6–10.4)
Chloride: 100 mmol/L (ref 98–110)
Creat: 0.91 mg/dL (ref 0.50–1.03)
Globulin: 2.6 g/dL (ref 1.9–3.7)
Glucose, Bld: 88 mg/dL (ref 65–99)
Potassium: 4.5 mmol/L (ref 3.5–5.3)
Sodium: 141 mmol/L (ref 135–146)
Total Bilirubin: 1.4 mg/dL — ABNORMAL HIGH (ref 0.2–1.2)
Total Protein: 7.3 g/dL (ref 6.1–8.1)
eGFR: 73 mL/min/{1.73_m2}

## 2024-02-17 LAB — CBC WITH DIFFERENTIAL/PLATELET
Absolute Lymphocytes: 1659 {cells}/uL (ref 850–3900)
Absolute Monocytes: 339 {cells}/uL (ref 200–950)
Basophils Absolute: 32 {cells}/uL (ref 0–200)
Basophils Relative: 0.6 %
Eosinophils Absolute: 80 {cells}/uL (ref 15–500)
Eosinophils Relative: 1.5 %
HCT: 40.4 % (ref 35.9–46.0)
Hemoglobin: 13.2 g/dL (ref 11.7–15.5)
MCH: 32.4 pg (ref 27.0–33.0)
MCHC: 32.7 g/dL (ref 31.6–35.4)
MCV: 99 fL (ref 81.4–101.7)
MPV: 10.1 fL (ref 7.5–12.5)
Monocytes Relative: 6.4 %
Neutro Abs: 3191 {cells}/uL (ref 1500–7800)
Neutrophils Relative %: 60.2 %
Platelets: 264 10*3/uL (ref 140–400)
RBC: 4.08 Million/uL (ref 3.80–5.10)
RDW: 12.4 % (ref 11.0–15.0)
Total Lymphocyte: 31.3 %
WBC: 5.3 10*3/uL (ref 3.8–10.8)

## 2024-02-17 LAB — LIPID PANEL
Cholesterol: 207 mg/dL — ABNORMAL HIGH
HDL: 90 mg/dL
LDL Cholesterol (Calc): 95 mg/dL
Non-HDL Cholesterol (Calc): 117 mg/dL
Total CHOL/HDL Ratio: 2.3 (calc)
Triglycerides: 122 mg/dL

## 2024-03-14 ENCOUNTER — Other Ambulatory Visit (HOSPITAL_COMMUNITY)

## 2025-02-18 ENCOUNTER — Other Ambulatory Visit

## 2025-02-21 ENCOUNTER — Encounter: Admitting: Internal Medicine
